# Patient Record
Sex: Female | Born: 1950 | Race: White | Hispanic: No | Marital: Married | Smoking: Former smoker
Health system: Southern US, Community
[De-identification: ages and names within clinical notes are randomized; demographics above are authoritative.]

## PROBLEM LIST (undated history)

## (undated) DIAGNOSIS — J45909 Unspecified asthma, uncomplicated: Secondary | ICD-10-CM

## (undated) DIAGNOSIS — T8859XA Other complications of anesthesia, initial encounter: Secondary | ICD-10-CM

## (undated) DIAGNOSIS — R7303 Prediabetes: Secondary | ICD-10-CM

## (undated) DIAGNOSIS — M1990 Unspecified osteoarthritis, unspecified site: Secondary | ICD-10-CM

## (undated) DIAGNOSIS — J329 Chronic sinusitis, unspecified: Secondary | ICD-10-CM

## (undated) DIAGNOSIS — J189 Pneumonia, unspecified organism: Secondary | ICD-10-CM

## (undated) DIAGNOSIS — K219 Gastro-esophageal reflux disease without esophagitis: Secondary | ICD-10-CM

## (undated) DIAGNOSIS — Z87442 Personal history of urinary calculi: Secondary | ICD-10-CM

## (undated) DIAGNOSIS — I499 Cardiac arrhythmia, unspecified: Secondary | ICD-10-CM

## (undated) DIAGNOSIS — T4145XA Adverse effect of unspecified anesthetic, initial encounter: Secondary | ICD-10-CM

## (undated) DIAGNOSIS — K4130 Unilateral femoral hernia, with obstruction, without gangrene, not specified as recurrent: Secondary | ICD-10-CM

## (undated) DIAGNOSIS — I959 Hypotension, unspecified: Secondary | ICD-10-CM

## (undated) DIAGNOSIS — H6690 Otitis media, unspecified, unspecified ear: Secondary | ICD-10-CM

## (undated) DIAGNOSIS — J3089 Other allergic rhinitis: Secondary | ICD-10-CM

## (undated) DIAGNOSIS — I4510 Unspecified right bundle-branch block: Secondary | ICD-10-CM

## (undated) DIAGNOSIS — C801 Malignant (primary) neoplasm, unspecified: Secondary | ICD-10-CM

## (undated) HISTORY — PX: COLONOSCOPY: SHX174

## (undated) HISTORY — PX: COLON SURGERY: SHX602

## (undated) HISTORY — PX: CATARACT EXTRACTION: SUR2

## (undated) HISTORY — PX: REFRACTIVE SURGERY: SHX103

## (undated) HISTORY — PX: KNEE SURGERY: SHX244

## (undated) HISTORY — PX: FOOT SURGERY: SHX648

## (undated) HISTORY — PX: COLOSTOMY: SHX63

---

## 1997-12-30 ENCOUNTER — Other Ambulatory Visit: Admission: RE | Admit: 1997-12-30 | Discharge: 1997-12-30 | Payer: Self-pay | Admitting: Gynecology

## 1998-09-13 ENCOUNTER — Other Ambulatory Visit: Admission: RE | Admit: 1998-09-13 | Discharge: 1998-09-13 | Payer: Self-pay | Admitting: Gynecology

## 1998-11-08 ENCOUNTER — Ambulatory Visit (HOSPITAL_BASED_OUTPATIENT_CLINIC_OR_DEPARTMENT_OTHER): Admission: RE | Admit: 1998-11-08 | Discharge: 1998-11-08 | Payer: Self-pay | Admitting: Orthopedic Surgery

## 1999-05-09 ENCOUNTER — Ambulatory Visit (HOSPITAL_BASED_OUTPATIENT_CLINIC_OR_DEPARTMENT_OTHER): Admission: RE | Admit: 1999-05-09 | Discharge: 1999-05-09 | Payer: Self-pay | Admitting: Orthopedic Surgery

## 1999-12-22 ENCOUNTER — Other Ambulatory Visit: Admission: RE | Admit: 1999-12-22 | Discharge: 1999-12-22 | Payer: Self-pay | Admitting: Gynecology

## 2000-06-28 ENCOUNTER — Other Ambulatory Visit: Admission: RE | Admit: 2000-06-28 | Discharge: 2000-06-28 | Payer: Self-pay | Admitting: Gynecology

## 2001-01-09 ENCOUNTER — Ambulatory Visit (HOSPITAL_BASED_OUTPATIENT_CLINIC_OR_DEPARTMENT_OTHER): Admission: RE | Admit: 2001-01-09 | Discharge: 2001-01-09 | Payer: Self-pay | Admitting: Orthopedic Surgery

## 2001-02-21 ENCOUNTER — Other Ambulatory Visit: Admission: RE | Admit: 2001-02-21 | Discharge: 2001-02-21 | Payer: Self-pay | Admitting: Gynecology

## 2002-05-12 ENCOUNTER — Other Ambulatory Visit: Admission: RE | Admit: 2002-05-12 | Discharge: 2002-05-12 | Payer: Self-pay | Admitting: Gynecology

## 2002-11-11 ENCOUNTER — Other Ambulatory Visit: Admission: RE | Admit: 2002-11-11 | Discharge: 2002-11-11 | Payer: Self-pay | Admitting: Gynecology

## 2003-06-18 ENCOUNTER — Other Ambulatory Visit: Admission: RE | Admit: 2003-06-18 | Discharge: 2003-06-18 | Payer: Self-pay | Admitting: Gynecology

## 2004-07-28 ENCOUNTER — Other Ambulatory Visit: Admission: RE | Admit: 2004-07-28 | Discharge: 2004-07-28 | Payer: Self-pay | Admitting: Gynecology

## 2010-09-30 ENCOUNTER — Other Ambulatory Visit: Payer: Self-pay | Admitting: Orthopedic Surgery

## 2010-09-30 ENCOUNTER — Encounter (INDEPENDENT_AMBULATORY_CARE_PROVIDER_SITE_OTHER): Payer: 59 | Admitting: *Deleted

## 2010-09-30 DIAGNOSIS — M79609 Pain in unspecified limb: Secondary | ICD-10-CM

## 2010-09-30 DIAGNOSIS — M7989 Other specified soft tissue disorders: Secondary | ICD-10-CM

## 2010-10-04 ENCOUNTER — Encounter: Payer: Self-pay | Admitting: Orthopedic Surgery

## 2010-11-04 ENCOUNTER — Encounter (HOSPITAL_BASED_OUTPATIENT_CLINIC_OR_DEPARTMENT_OTHER)
Admission: RE | Admit: 2010-11-04 | Discharge: 2010-11-04 | Disposition: A | Discharge status: Home / Self Care | Payer: 59 | Source: Ambulatory Visit | Attending: Orthopedic Surgery | Admitting: Orthopedic Surgery

## 2010-11-07 ENCOUNTER — Ambulatory Visit (HOSPITAL_BASED_OUTPATIENT_CLINIC_OR_DEPARTMENT_OTHER)
Admission: RE | Admit: 2010-11-07 | Discharge: 2010-11-07 | Disposition: A | Discharge status: Home / Self Care | Payer: 59 | Source: Ambulatory Visit | Attending: Orthopedic Surgery | Admitting: Orthopedic Surgery

## 2010-11-07 DIAGNOSIS — IMO0002 Reserved for concepts with insufficient information to code with codable children: Secondary | ICD-10-CM | POA: Insufficient documentation

## 2010-11-07 DIAGNOSIS — F17200 Nicotine dependence, unspecified, uncomplicated: Secondary | ICD-10-CM | POA: Insufficient documentation

## 2010-11-07 DIAGNOSIS — Z01810 Encounter for preprocedural cardiovascular examination: Secondary | ICD-10-CM | POA: Insufficient documentation

## 2010-11-07 DIAGNOSIS — Z01812 Encounter for preprocedural laboratory examination: Secondary | ICD-10-CM | POA: Insufficient documentation

## 2010-11-07 DIAGNOSIS — M2240 Chondromalacia patellae, unspecified knee: Secondary | ICD-10-CM | POA: Insufficient documentation

## 2010-11-07 DIAGNOSIS — J449 Chronic obstructive pulmonary disease, unspecified: Secondary | ICD-10-CM | POA: Insufficient documentation

## 2010-11-07 DIAGNOSIS — S83289A Other tear of lateral meniscus, current injury, unspecified knee, initial encounter: Secondary | ICD-10-CM | POA: Insufficient documentation

## 2010-11-07 DIAGNOSIS — J4489 Other specified chronic obstructive pulmonary disease: Secondary | ICD-10-CM | POA: Insufficient documentation

## 2010-11-07 LAB — POCT HEMOGLOBIN-HEMACUE: Hemoglobin: 14.3 g/dL (ref 12.0–15.0)
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
## 2013-07-11 ENCOUNTER — Telehealth: Payer: Self-pay

## 2013-07-11 NOTE — Telephone Encounter (Signed)
Rec'd from [facility] forward 2 pages to Historical Provider for Pulmonary

## 2014-02-13 HISTORY — PX: KNEE SURGERY: SHX244

## 2014-07-04 ENCOUNTER — Observation Stay (HOSPITAL_COMMUNITY): Payer: 59

## 2014-07-04 ENCOUNTER — Encounter (HOSPITAL_COMMUNITY)
Admission: EM | Disposition: A | Discharge status: Home / Self Care | Payer: Self-pay | Source: Home / Self Care | Attending: Emergency Medicine

## 2014-07-04 ENCOUNTER — Emergency Department (HOSPITAL_COMMUNITY): Payer: 59

## 2014-07-04 ENCOUNTER — Emergency Department (HOSPITAL_COMMUNITY): Payer: 59 | Admitting: Anesthesiology

## 2014-07-04 ENCOUNTER — Encounter (HOSPITAL_COMMUNITY): Payer: Self-pay | Admitting: *Deleted

## 2014-07-04 ENCOUNTER — Observation Stay (HOSPITAL_COMMUNITY)
Admission: EM | Admit: 2014-07-04 | Discharge: 2014-07-06 | Disposition: A | Discharge status: Home / Self Care | Payer: 59 | Attending: Orthopedic Surgery | Admitting: Orthopedic Surgery

## 2014-07-04 DIAGNOSIS — S52272A Monteggia's fracture of left ulna, initial encounter for closed fracture: Secondary | ICD-10-CM | POA: Diagnosis not present

## 2014-07-04 DIAGNOSIS — S42402A Unspecified fracture of lower end of left humerus, initial encounter for closed fracture: Secondary | ICD-10-CM

## 2014-07-04 DIAGNOSIS — S42409A Unspecified fracture of lower end of unspecified humerus, initial encounter for closed fracture: Secondary | ICD-10-CM | POA: Diagnosis present

## 2014-07-04 DIAGNOSIS — S52122A Displaced fracture of head of left radius, initial encounter for closed fracture: Secondary | ICD-10-CM

## 2014-07-04 DIAGNOSIS — F1099 Alcohol use, unspecified with unspecified alcohol-induced disorder: Secondary | ICD-10-CM | POA: Diagnosis not present

## 2014-07-04 DIAGNOSIS — F17210 Nicotine dependence, cigarettes, uncomplicated: Secondary | ICD-10-CM | POA: Insufficient documentation

## 2014-07-04 DIAGNOSIS — R52 Pain, unspecified: Secondary | ICD-10-CM

## 2014-07-04 DIAGNOSIS — W010XXA Fall on same level from slipping, tripping and stumbling without subsequent striking against object, initial encounter: Secondary | ICD-10-CM | POA: Insufficient documentation

## 2014-07-04 DIAGNOSIS — S5292XA Unspecified fracture of left forearm, initial encounter for closed fracture: Secondary | ICD-10-CM

## 2014-07-04 DIAGNOSIS — Z885 Allergy status to narcotic agent status: Secondary | ICD-10-CM | POA: Insufficient documentation

## 2014-07-04 HISTORY — PX: ORIF ELBOW FRACTURE: SHX5031

## 2014-07-04 SURGERY — OPEN REDUCTION INTERNAL FIXATION (ORIF) ELBOW/OLECRANON FRACTURE
Anesthesia: Regional | Laterality: Left

## 2014-07-04 MED ORDER — ROPIVACAINE HCL 5 MG/ML IJ SOLN
INTRAMUSCULAR | Status: AC
  Filled 2014-07-04: qty 30

## 2014-07-04 MED ORDER — FENTANYL CITRATE (PF) 100 MCG/2ML IJ SOLN
INTRAMUSCULAR | Status: DC | PRN
  Administered 2014-07-04 (×8): 25 ug via INTRAVENOUS

## 2014-07-04 MED ORDER — DEXMEDETOMIDINE HCL IN NACL 200 MCG/50ML IV SOLN
0.4000 ug/kg/h | INTRAVENOUS | Status: DC
  Filled 2014-07-04: qty 50

## 2014-07-04 MED ORDER — LACTATED RINGERS IV SOLN
INTRAVENOUS | Status: DC | PRN
  Administered 2014-07-04: 19:00:00 via INTRAVENOUS

## 2014-07-04 MED ORDER — FENTANYL CITRATE (PF) 100 MCG/2ML IJ SOLN
INTRAMUSCULAR | Status: AC
  Filled 2014-07-04: qty 2

## 2014-07-04 MED ORDER — ONDANSETRON HCL 4 MG/2ML IJ SOLN
INTRAMUSCULAR | Status: DC | PRN
  Administered 2014-07-04: 4 mg via INTRAVENOUS

## 2014-07-04 MED ORDER — CEFAZOLIN SODIUM-DEXTROSE 2-3 GM-% IV SOLR
INTRAVENOUS | Status: AC
  Filled 2014-07-04: qty 50

## 2014-07-04 MED ORDER — HYDROMORPHONE HCL 1 MG/ML IJ SOLN
1.0000 mg | Freq: Once | INTRAMUSCULAR | Status: AC
  Administered 2014-07-04: 1 mg via INTRAMUSCULAR
  Filled 2014-07-04: qty 1

## 2014-07-04 MED ORDER — ONDANSETRON HCL 4 MG/2ML IJ SOLN
4.0000 mg | Freq: Three times a day (TID) | INTRAMUSCULAR | Status: DC | PRN
  Administered 2014-07-04: 4 mg via INTRAVENOUS
  Filled 2014-07-04: qty 2

## 2014-07-04 MED ORDER — ONDANSETRON 8 MG PO TBDP
8.0000 mg | ORAL_TABLET | Freq: Once | ORAL | Status: AC
  Administered 2014-07-04: 8 mg via ORAL
  Filled 2014-07-04: qty 1

## 2014-07-04 MED ORDER — 0.9 % SODIUM CHLORIDE (POUR BTL) OPTIME
TOPICAL | Status: DC | PRN
  Administered 2014-07-04 (×3): 1000 mL

## 2014-07-04 MED ORDER — CEFAZOLIN SODIUM-DEXTROSE 2-3 GM-% IV SOLR
INTRAVENOUS | Status: DC | PRN
  Administered 2014-07-04: 2 g via INTRAVENOUS

## 2014-07-04 MED ORDER — HYDROMORPHONE HCL 1 MG/ML IJ SOLN: 0.2500 mg | INTRAMUSCULAR | Status: DC | PRN

## 2014-07-04 MED ORDER — ONDANSETRON HCL 4 MG/2ML IJ SOLN
INTRAMUSCULAR | Status: AC
  Filled 2014-07-04: qty 2

## 2014-07-04 MED ORDER — MIDAZOLAM HCL 2 MG/2ML IJ SOLN
INTRAMUSCULAR | Status: AC
  Filled 2014-07-04: qty 2

## 2014-07-04 MED ORDER — ROPIVACAINE HCL 5 MG/ML IJ SOLN
INTRAMUSCULAR | Status: DC | PRN
  Administered 2014-07-04: 30 mL via PERINEURAL

## 2014-07-04 MED ORDER — MIDAZOLAM HCL 5 MG/5ML IJ SOLN
INTRAMUSCULAR | Status: DC | PRN
  Administered 2014-07-04 (×2): 1 mg via INTRAVENOUS

## 2014-07-04 SURGICAL SUPPLY — 63 items
BANDAGE ELASTIC 4 VELCRO ST LF (GAUZE/BANDAGES/DRESSINGS) ×2 IMPLANT
BIT DRILL 2.5X2.75 QC CALB (BIT) ×2 IMPLANT
BIT DRILL CALIBRATED 2.7 (BIT) ×2 IMPLANT
BIT DRILL CANN 1.6 BIODRIVE (BIT) ×2 IMPLANT
BNDG CMPR 9X4 STRL LF SNTH (GAUZE/BANDAGES/DRESSINGS) ×1
BNDG COHESIVE 4X5 TAN STRL (GAUZE/BANDAGES/DRESSINGS) ×2 IMPLANT
BNDG ESMARK 4X9 LF (GAUZE/BANDAGES/DRESSINGS) ×2 IMPLANT
BNDG GAUZE ELAST 4 BULKY (GAUZE/BANDAGES/DRESSINGS) ×2 IMPLANT
COVER MAYO STAND STRL (DRAPES) ×4 IMPLANT
COVER SURGICAL LIGHT HANDLE (MISCELLANEOUS) ×2 IMPLANT
CUFF TOURN SGL QUICK 18 (TOURNIQUET CUFF) ×2 IMPLANT
CUFF TOURN SGL QUICK 24 (TOURNIQUET CUFF) ×2
CUFF TRNQT CYL 24X4X40X1 (TOURNIQUET CUFF) ×1 IMPLANT
DRAPE C-ARM 42X120 X-RAY (DRAPES) ×2 IMPLANT
DRAPE INCISE IOBAN 66X45 STRL (DRAPES) ×2 IMPLANT
DRAPE U-SHAPE 47X51 STRL (DRAPES) ×4 IMPLANT
DRSG AQUACEL AG ADV 3.5X10 (GAUZE/BANDAGES/DRESSINGS) ×2 IMPLANT
DURAPREP 26ML APPLICATOR (WOUND CARE) ×2 IMPLANT
ELECT REM PT RETURN 9FT ADLT (ELECTROSURGICAL) ×2
ELECTRODE REM PT RTRN 9FT ADLT (ELECTROSURGICAL) ×1 IMPLANT
GAUZE SPONGE 4X4 12PLY STRL (GAUZE/BANDAGES/DRESSINGS) ×2 IMPLANT
GLOVE BIOGEL PI IND STRL 8 (GLOVE) ×2 IMPLANT
GLOVE BIOGEL PI INDICATOR 8 (GLOVE) ×2
GLOVE SURG ORTHO 8.0 STRL STRW (GLOVE) ×4 IMPLANT
GOWN STRL REUS W/TWL LRG LVL3 (GOWN DISPOSABLE) ×8 IMPLANT
K-WIRE .8 (WIRE) ×4 IMPLANT
K-WIRE FIXATION 2.0X6 (WIRE)
KIT BASIN OR (CUSTOM PROCEDURE TRAY) ×2 IMPLANT
KWIRE FIXATION 2.0X6 (WIRE) IMPLANT
MANIFOLD NEPTUNE II (INSTRUMENTS) ×2 IMPLANT
NEEDLE HYPO 22GX1.5 SAFETY (NEEDLE) IMPLANT
PACK ORTHO EXTREMITY (CUSTOM PROCEDURE TRAY) ×2 IMPLANT
PAD ABD 8X10 STRL (GAUZE/BANDAGES/DRESSINGS) ×2 IMPLANT
PAD CAST 4YDX4 CTTN HI CHSV (CAST SUPPLIES) ×1 IMPLANT
PADDING CAST ABS 4INX4YD NS (CAST SUPPLIES) ×1
PADDING CAST ABS COTTON 4X4 ST (CAST SUPPLIES) ×1 IMPLANT
PADDING CAST COTTON 4X4 STRL (CAST SUPPLIES) ×2
PENCIL BUTTON HOLSTER BLD 10FT (ELECTRODE) ×2 IMPLANT
PLATE OLECRANON LRG (Plate) ×2 IMPLANT
POSITIONER SURGICAL ARM (MISCELLANEOUS) ×2 IMPLANT
SCREW BIODRIVE MICRO 2.3X14 (Screw) ×2 IMPLANT
SCREW BIODRIVE MICRO 2.3X18 (Screw) ×4 IMPLANT
SCREW LOCK CORT STAR 3.5X14 (Screw) ×6 IMPLANT
SCREW LOCK CORT STAR 3.5X58 (Screw) ×2 IMPLANT
SCREW LOW PROFILE 18MMX3.5MM (Screw) ×4 IMPLANT
SCREW LOW PROFILE 22MMX3.5MM (Screw) ×2 IMPLANT
SCREW NON LOCKING LP 3.5 16MM (Screw) ×2 IMPLANT
SPLINT PLASTER CAST XFAST 5X30 (CAST SUPPLIES) ×1 IMPLANT
SPLINT PLASTER XFAST SET 5X30 (CAST SUPPLIES) ×1
SPONGE LAP 4X18 X RAY DECT (DISPOSABLE) ×4 IMPLANT
STAPLER VISISTAT 35W (STAPLE) IMPLANT
STOCKINETTE 8 INCH (MISCELLANEOUS) IMPLANT
STRIP CLOSURE SKIN 1/2X4 (GAUZE/BANDAGES/DRESSINGS) ×2 IMPLANT
SUCTION FRAZIER 12FR DISP (SUCTIONS) ×2 IMPLANT
SUCTION FRAZIER TIP 10 FR DISP (SUCTIONS) ×2 IMPLANT
SUT MNCRL AB 3-0 PS2 18 (SUTURE) ×2 IMPLANT
SUT VIC AB 0 CT2 27 (SUTURE) ×2 IMPLANT
SUT VIC AB 2-0 CT1 27 (SUTURE) ×4
SUT VIC AB 2-0 CT1 TAPERPNT 27 (SUTURE) ×2 IMPLANT
SUT VIC AB 2-0 CT2 27 (SUTURE) ×6 IMPLANT
SYR CONTROL 10ML LL (SYRINGE) IMPLANT
TOWEL OR 17X26 10 PK STRL BLUE (TOWEL DISPOSABLE) ×6 IMPLANT
WASHER 3.5MM (Orthopedic Implant) ×2 IMPLANT

## 2014-07-04 NOTE — Anesthesia Preprocedure Evaluation (Addendum)
Anesthesia Evaluation  Patient identified by MRN, date of birth, ID band Patient awake    Reviewed: Allergy & Precautions, NPO status , Patient's Chart, lab work & pertinent test results  Airway Mallampati: II  TM Distance: >3 FB Neck ROM: Full    Dental no notable dental hx.    Pulmonary Current Smoker,  breath sounds clear to auscultation  Pulmonary exam normal       Cardiovascular negative cardio ROS Normal cardiovascular examRhythm:Regular Rate:Normal     Neuro/Psych negative neurological ROS  negative psych ROS   GI/Hepatic negative GI ROS, Neg liver ROS,   Endo/Other  negative endocrine ROS  Renal/GU negative Renal ROS  negative genitourinary   Musculoskeletal negative musculoskeletal ROS (+)   Abdominal   Peds negative pediatric ROS (+)  Hematology negative hematology ROS (+)   Anesthesia Other Findings   Reproductive/Obstetrics negative OB ROS                          Anesthesia Physical Anesthesia Plan  ASA: II and emergent  Anesthesia Plan: Regional   Post-op Pain Management:    Induction: Intravenous  Airway Management Planned: Natural Airway and Nasal Cannula  Additional Equipment:   Intra-op Plan:   Post-operative Plan:   Informed Consent: I have reviewed the patients History and Physical, chart, labs and discussed the procedure including the risks, benefits and alternatives for the proposed anesthesia with the patient or authorized representative who has indicated his/her understanding and acceptance.   Dental advisory given  Plan Discussed with: CRNA  Anesthesia Plan Comments: (Discussed risks and benefits of supraclavicular block including failure, bleeding, infection, nerve damage, weakness, pneumothorax.. Questions answered. Patient consents to block.  Ms. Shevchenko states she has had problems with general anesthesia and propofol in the past. She states  she is very sensitive to medications and she requests light sedation for this procedure.)      Anesthesia Quick Evaluation

## 2014-07-04 NOTE — Transfer of Care (Signed)
Immediate Anesthesia Transfer of Care Note  Patient: Amber Klein  Procedure(s) Performed: Procedure(s): OPEN REDUCTION INTERNAL FIXATION (ORIF) ELBOW/OLECRANON FRACTURE (Left)  Patient Location: PACU  Anesthesia Type:MAC and Regional  Level of Consciousness: awake, alert , oriented and patient cooperative  Airway & Oxygen Therapy: Patient Spontanous Breathing and Patient connected to nasal cannula oxygen  Post-op Assessment: Report given to RN, Post -op Vital signs reviewed and stable and Patient moving all extremities  Post vital signs: Reviewed and stable  Last Vitals:  Filed Vitals:   07/04/14 1753  BP: 115/77  Pulse: 80  Temp:   Resp: 16    Complications: No apparent anesthesia complications

## 2014-07-04 NOTE — ED Notes (Signed)
MD at bedside. 

## 2014-07-04 NOTE — Anesthesia Procedure Notes (Addendum)
Procedure Name: MAC Date/Time: 07/04/2014 7:51 PM Performed by: Carleene Cooper A Pre-anesthesia Checklist: Patient identified, Timeout performed, Emergency Drugs available, Suction available and Patient being monitored Patient Re-evaluated:Patient Re-evaluated prior to inductionOxygen Delivery Method: Nasal cannula Dental Injury: Teeth and Oropharynx as per pre-operative assessment    Anesthesia Regional Block:  Supraclavicular block  Pre-Anesthetic Checklist: ,, timeout performed, Correct Patient, Correct Site, Correct Laterality, Correct Procedure, Correct Position, site marked, Risks and benefits discussed, Surgical consent,  Pre-op evaluation,  At surgeon's request  Laterality: Left and Upper  Prep: chloraprep       Needles:  Injection technique: Single-shot      Needle Gauge: 21 and 21 G    Additional Needles:  Procedures: ultrasound guided (picture in chart) Supraclavicular block Narrative:  Start time: 07/04/2014 7:30 PM End time: 07/04/2014 7:45 PM Injection made incrementally with aspirations every 5 mL.  Performed by: Personally  Anesthesiologist: Franne Grip  Additional Notes: No pain with injection. No increased resistance to injection. Meaningful verbal contact maintained with patient throughout placement of block. Tolerated well. Ultrasound picture in chart. Loss of pain in left elbow soon after block.

## 2014-07-04 NOTE — ED Notes (Signed)
Pt reports fall, tripping over bottom of a step. Fell onto L side, C/o L arm pain, specifically elbow but radiating to upper arm and down to wrist. No shoulder pain, CMS intact. Swelling to L hand present. Does have a ring on 4th digit that she is unable off easily at this point. Denies hitting head.

## 2014-07-04 NOTE — Brief Op Note (Signed)
07/04/2014  11:23 PM  PATIENT:  Amber Klein  64 y.o. female  PRE-OPERATIVE DIAGNOSIS:  left elbow fracture  POST-OPERATIVE DIAGNOSIS:  left elbow fracture  PROCEDURE:  Procedure(s): OPEN REDUCTION INTERNAL FIXATION (ORIF) ELBOW/OLECRANON FRACTURE ulna and radial head  SURGEON:  Surgeon(s): Meredith Pel, MD  ASSISTANT: none  ANESTHESIA:   regional  EBL: 50 ml    Total I/O In: 1000 [I.V.:1000] Out: 25 [Blood:25]  BLOOD ADMINISTERED: none  DRAINS: none   LOCAL MEDICATIONS USED:  none  SPECIMEN:  No Specimen  COUNTS:  YES  TOURNIQUET:   Total Tourniquet Time Documented: Upper Arm (Left) - 107 minutes Total: Upper Arm (Left) - 107 minutes   DICTATION: .Other Dictation: Dictation Number [id number]  PLAN OF CARE: Admit for overnight observation  PATIENT DISPOSITION:  PACU - hemodynamically stable

## 2014-07-04 NOTE — ED Notes (Signed)
Patient transported to X-ray 

## 2014-07-04 NOTE — H&P (Signed)
Amber Klein is an 64 y.o. female.   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 She denies any loss of consciousness but does report significant left elbow pain. Denies any other orthopedic complaints. She does have a history of having had reactions to surgical anesthesia. Last hand surgery in the surgery were done under block. She describes a particularly severe reaction to propofol  History reviewed. No pertinent past medical history.  Past Surgical History  Procedure Laterality Date  . Knee surgery    . Foot surgery    . Cesarean section      No family history on file. Social History:  reports that she has been smoking.  She does not have any smokeless tobacco history on file. She reports that she drinks alcohol. She reports that she does not use illicit drugs.  Allergies:  Allergies  Allergen Reactions  . Propoxyphene Anaphylaxis     (Not in a hospital admission)  No results found for this or any previous visit (from the past 48 hour(s)). Dg Elbow Complete Left  07/04/2014   CLINICAL DATA:  Patient fell, tripping while on stairs, falling onto the left arm. Initial encounter.  EXAM: LEFT ELBOW - COMPLETE 3+ VIEW  COMPARISON:  Left humerus x-rays obtained concurrently.  FINDINGS: Comminuted transverse and horizontal fracture involving the proximal ulnar metaphysis. Fracture involving the radial head, best seen on the lateral image. Joint effusion/hemarthrosis with posterior fat pad. Well preserved bone mineral density for age. Joint spaces well preserved.  IMPRESSION: 1. Comminuted fracture involving the proximal ulnar metaphysis. 2. Radial head fracture.   Electronically Signed   By: Evangeline Dakin M.D.   On: 07/04/2014 13:42   Dg Wrist Complete Left  07/04/2014   CLINICAL DATA:  Patient with generalized left breast pain status post fall. No prior left arm injuries.  EXAM: LEFT WRIST -  COMPLETE 3+ VIEW  COMPARISON:  None.  FINDINGS: Normal anatomic alignment. No definite evidence for acute fracture or dislocation. Degenerative changes at the first [facility] joint. Regional soft tissues unremarkable.  IMPRESSION: No evidence for acute fracture or dislocation. If the patient's tenderness is in the vicinity of the scaphoid/anatomic snuffbox, then cross-sectional imaging or presumptive treatment for occult scaphoid fracture might be considered.   Electronically Signed   By: Lovey Newcomer M.D.   On: 07/04/2014 13:47   Dg Humerus Left  07/04/2014   CLINICAL DATA:  Fall with acute left arm pain.  Initial encounter.  EXAM: LEFT HUMERUS - 2+ VIEW  COMPARISON:  Elbow films on the same date.  FINDINGS: No evidence of acute humeral fracture or dislocation. The proximal ulnar fracture is visible is better delineated on the elbow series.  IMPRESSION: No evidence of acute fracture involving the left humerus.   Electronically Signed   By: Aletta Edouard M.D.   On: 07/04/2014 13:51    Review of Systems  Constitutional: Negative.   HENT: Negative.   Eyes: Negative.   Respiratory: Negative.   Cardiovascular: Negative.   Gastrointestinal: Negative.   Genitourinary: Negative.   Musculoskeletal: Positive for joint pain.  Skin: Negative.   Neurological: Negative.   Endo/Heme/Allergies: Negative.   Psychiatric/Behavioral: Negative.     Blood pressure 114/73, pulse 83, temperature 98 F (36.7 C), temperature source Oral, resp. rate 16, SpO2 98 %. Physical Exam  Constitutional: She appears well-developed.  HENT:  Head: Normocephalic.  Eyes: Conjunctivae are normal.  Neck: Normal  range of motion.  Cardiovascular: Normal rate.   Respiratory: Effort normal.  Neurological: She is alert.  Skin: Skin is warm.  Psychiatric: She has a normal mood and affect.   examination the left arm demonstrates that the arm splinted shoulder and wrist have no ecchymosis or bruising she has palpable radial pulse intact  EPL FPL interosseous function  Assessment/Plan Impression is proximal ulna fracture comminuted with radial head fracture nondominant arm 64 year old female with a history of osteoporosis or osteopenia plan is for CT scan to better define the radial head fracture she will need fixation of this fracture done potentially under interscalene block anesthesia. I don't think this fracture has much intrinsic ability to heal on its own without significant elbow stiffness. The goals of surgery to restore stable fixation and alignment to allow for early motion are discussed. Other risks including but not been limited to infection nerve vessel damage elbow stiffness potential need for hardware removal discussed with the patient all questions answered  DEAN,GREGORY SCOTT 07/04/2014, 4:17 PM

## 2014-07-04 NOTE — ED Provider Notes (Signed)
CSN: [id number]     Arrival date & time 07/04/14  1224 History   First MD Initiated Contact with Patient 07/04/14 1247     Chief Complaint  Patient presents with  . Shoulder Pain  . Fall     (Consider location/radiation/quality/duration/timing/severity/associated sxs/prior Treatment) HPI   Amber Klein is a 64 y.o. female who presents for evaluation of left elbow injury, fall. She states that she tripped and fell, in her home. She complains of pain only in her left elbow. She denies headache, back pain, neck pain, nausea, vomiting, weakness or dizziness. There are no preceding symptoms, before the fall. She is able to ambulate. She came here by private vehicle.   History reviewed. No pertinent past medical history. Past Surgical History  Procedure Laterality Date  . Knee surgery    . Foot surgery    . Cesarean section     No family history on file. History  Substance Use Topics  . Smoking status: Current Every Day Smoker  . Smokeless tobacco: Not on file  . Alcohol Use: Yes     Comment: 1/2 beer daily   OB History    No data available     Review of Systems    Allergies  Propoxyphene  Home Medications   Prior to Admission medications   Not on File   BP 112/86 mmHg  Pulse 98  Temp(Src) 98 F (36.7 C) (Oral)  Resp 16  SpO2 95% Physical Exam  Constitutional: She is oriented to person, place, and time. She appears well-developed and well-nourished.  HENT:  Head: Normocephalic and atraumatic.  Right Ear: External ear normal.  Left Ear: External ear normal.  Eyes: Conjunctivae and EOM are normal. Pupils are equal, round, and reactive to light.  Neck: Normal range of motion and phonation normal. Neck supple.  Cardiovascular: Normal rate, regular rhythm and normal heart sounds.   Pulmonary/Chest: Effort normal and breath sounds normal. No respiratory distress. She exhibits no bony tenderness.  Abdominal: Soft. There is no tenderness.  Musculoskeletal:  Normal range of motion.  Nontender left shoulder. No deformity, left shoulder. Diffusely tender, left elbow without deformity. No vascular intact distally left hand. I removed a ring from her left ring finger without problem.  Neurological: She is alert and oriented to person, place, and time. No cranial nerve deficit or sensory deficit. She exhibits normal muscle tone. Coordination normal.  Skin: Skin is warm, dry and intact.  Psychiatric: She has a normal mood and affect. Her behavior is normal. Judgment and thought content normal.  Nursing note and vitals reviewed.   ED Course  Procedures (including critical care time)   Medications  HYDROmorphone (DILAUDID) injection 1 mg (not administered)  ondansetron (ZOFRAN-ODT) disintegrating tablet 8 mg (not administered)    Patient Vitals for the past 24 hrs:  BP Temp Temp src Pulse Resp SpO2  07/04/14 1230 112/86 mmHg 98 F (36.7 C) Oral 98 16 95 %    12:55 PM Reevaluation with update and discussion. After initial assessment and treatment, an updated evaluation reveals she is more comfortable now. Patient updated on findings.[name] L   14:25- case discussed with orthopedics. He requests CT imaging of the injury  15:50- Care to Dr. Sabra Heck to evaluate after return of CT and coordinate care with Dr. Marlou Sa (Ortho).  Labs Review Labs Reviewed - No data to display  Imaging Review No results found.   EKG Interpretation None      MDM   Final diagnoses:  Pain  Fracture, proximal radius or ulna, closed, left, initial encounter  Fracture of radial head, closed, left, initial encounter    Mechanical fall with isolated injury, left elbow. Orthopedic consultation with outpatient management and is expected. Disposition will be after CT imaging   Plan: Eval for D/C after CT.    [name], MD 07/07/14 [phone]

## 2014-07-04 NOTE — ED Provider Notes (Signed)
D/w Dr. Marlou Sa - recommends surgery - will take shortly - temp holding orders requested and written.  Noemi Chapel, MD 07/04/14 [phone]

## 2014-07-04 NOTE — ED Notes (Signed)
Pt in CT.

## 2014-07-05 ENCOUNTER — Encounter (HOSPITAL_COMMUNITY): Payer: Self-pay | Admitting: *Deleted

## 2014-07-05 MED ORDER — METOCLOPRAMIDE HCL 10 MG PO TABS: 5.0000 mg | ORAL_TABLET | Freq: Three times a day (TID) | ORAL | Status: DC | PRN

## 2014-07-05 MED ORDER — ASPIRIN 325 MG PO TABS
325.0000 mg | ORAL_TABLET | Freq: Every day | ORAL | Status: DC
Start: 2014-07-05 — End: 2014-07-06
  Administered 2014-07-05 – 2014-07-06 (×2): 325 mg via ORAL
  Filled 2014-07-05 (×2): qty 1

## 2014-07-05 MED ORDER — CEFAZOLIN SODIUM-DEXTROSE 2-3 GM-% IV SOLR
2.0000 g | Freq: Three times a day (TID) | INTRAVENOUS | Status: AC
  Administered 2014-07-05 (×2): 2 g via INTRAVENOUS
  Filled 2014-07-05 (×2): qty 50

## 2014-07-05 MED ORDER — METOCLOPRAMIDE HCL 5 MG/ML IJ SOLN: 5.0000 mg | Freq: Three times a day (TID) | INTRAMUSCULAR | Status: DC | PRN

## 2014-07-05 MED ORDER — POTASSIUM CHLORIDE IN NACL 20-0.9 MEQ/L-% IV SOLN
INTRAVENOUS | Status: AC
  Administered 2014-07-05: 75 mL/h via INTRAVENOUS
  Filled 2014-07-05 (×2): qty 1000

## 2014-07-05 MED ORDER — ACETAMINOPHEN 650 MG RE SUPP: 650.0000 mg | Freq: Four times a day (QID) | RECTAL | Status: DC | PRN

## 2014-07-05 MED ORDER — METHOCARBAMOL 500 MG PO TABS: 500.0000 mg | ORAL_TABLET | Freq: Four times a day (QID) | ORAL | Status: DC | PRN

## 2014-07-05 MED ORDER — HYDROCODONE-ACETAMINOPHEN 5-325 MG PO TABS: 1.0000 | ORAL_TABLET | ORAL | Status: DC | PRN

## 2014-07-05 MED ORDER — ONDANSETRON HCL 4 MG PO TABS
4.0000 mg | ORAL_TABLET | Freq: Four times a day (QID) | ORAL | Status: DC | PRN
  Administered 2014-07-06: 4 mg via ORAL
  Filled 2014-07-05: qty 1

## 2014-07-05 MED ORDER — ACETAMINOPHEN 325 MG PO TABS
650.0000 mg | ORAL_TABLET | Freq: Four times a day (QID) | ORAL | Status: DC | PRN
  Administered 2014-07-06: 325 mg via ORAL
  Filled 2014-07-05 (×2): qty 2

## 2014-07-05 MED ORDER — HYDROCODONE-ACETAMINOPHEN 5-325 MG PO TABS
1.0000 | ORAL_TABLET | ORAL | Status: DC | PRN
  Administered 2014-07-05: 2 via ORAL
  Administered 2014-07-05: 1 via ORAL
  Administered 2014-07-05: 2 via ORAL
  Administered 2014-07-05: 1 via ORAL
  Administered 2014-07-05 (×2): 2 via ORAL
  Administered 2014-07-06: 1 via ORAL
  Administered 2014-07-06: 2 via ORAL
  Filled 2014-07-05: qty 2
  Filled 2014-07-05 (×3): qty 1
  Filled 2014-07-05 (×4): qty 2

## 2014-07-05 MED ORDER — IBUPROFEN 200 MG PO TABS: 200.0000 mg | ORAL_TABLET | Freq: Four times a day (QID) | ORAL | Status: DC | PRN

## 2014-07-05 MED ORDER — ESTRADIOL 1 MG PO TABS
1.0000 mg | ORAL_TABLET | Freq: Every day | ORAL | Status: DC
  Administered 2014-07-05 – 2014-07-06 (×2): 1 mg via ORAL
  Filled 2014-07-05 (×2): qty 1

## 2014-07-05 MED ORDER — ONDANSETRON HCL 4 MG PO TABS: 4.0000 mg | ORAL_TABLET | Freq: Four times a day (QID) | ORAL | Status: DC | PRN

## 2014-07-05 MED ORDER — ADULT MULTIVITAMIN W/MINERALS CH
1.0000 | ORAL_TABLET | ORAL | Status: DC
  Administered 2014-07-05: 1 via ORAL
  Filled 2014-07-05: qty 1

## 2014-07-05 MED ORDER — ASPIRIN 325 MG PO TABS: 325.0000 mg | ORAL_TABLET | Freq: Every day | ORAL | Status: DC

## 2014-07-05 MED ORDER — PROGESTERONE MICRONIZED 100 MG PO CAPS
100.0000 mg | ORAL_CAPSULE | Freq: Every day | ORAL | Status: DC
Start: 2014-07-15 — End: 2014-07-06

## 2014-07-05 MED ORDER — ONDANSETRON HCL 4 MG/2ML IJ SOLN
4.0000 mg | Freq: Four times a day (QID) | INTRAMUSCULAR | Status: DC | PRN
  Administered 2014-07-05: 4 mg via INTRAVENOUS
  Filled 2014-07-05: qty 2

## 2014-07-05 MED ORDER — METHOCARBAMOL 500 MG PO TABS
500.0000 mg | ORAL_TABLET | Freq: Four times a day (QID) | ORAL | Status: DC | PRN
  Administered 2014-07-05 (×2): 500 mg via ORAL
  Filled 2014-07-05 (×2): qty 1

## 2014-07-05 MED ORDER — METHOCARBAMOL 1000 MG/10ML IJ SOLN
500.0000 mg | Freq: Four times a day (QID) | INTRAVENOUS | Status: DC | PRN
  Filled 2014-07-05: qty 5

## 2014-07-05 NOTE — Op Note (Signed)
NAME:  Amber Klein NO.:  [id number]  MEDICAL RECORD NO.:  [id number]  LOCATION:  [facility]                         FACILITY:  [facility]  PHYSICIAN:  Anderson Malta, M.D.    DATE OF BIRTH:  [birth year]  DATE OF PROCEDURE: DATE OF DISCHARGE:                              OPERATIVE REPORT   PREOPERATIVE DIAGNOSIS:  Left elbow Monteggia fracture and dislocation, proximal ulna with radial head fracture and dislocation.  POSTOPERATIVE DIAGNOSIS:  Left elbow Monteggia fracture and dislocation, proximal ulna with radial head fracture and dislocation.  PROCEDURE:  Reduction of dislocation with open reduction and internal fixation of ulnar fracture and radial head fracture.  SURGEON:  Anderson Malta, M.D.  ASSISTANT:  None.  ANESTHESIA:  General.  INDICATIONS:  Amber Klein is a 64 year old female with left elbow fracture and dislocation, presents for operative management after explanation of risks and benefits.  PROCEDURE IN DETAIL:  The patient was brought to the operating room where regional anesthetic was induced.  Preop IV antibiotics were administered.  Time-out was called.  The patient was placed on a beanbag in the semi-lateral position.  Left arm was prescribed with alcohol and Betadine, allowed to air dry, prepped with DuraPrep solution and draped in a sterile manner.  Charlie Pitter was used to cover the operative field.  Arm was elevated and exsanguinated with Esmarch wrap.  Tourniquet was inflated.  Total time 107 minutes.  Posterior incision was made just lateral to the olecranon.  Skin and subcutaneous tissue were sharply divided.  The fracture was identified.  Location of the ulnar nerve was palpated and that nerve was avoided at all times.  The periosteum over the fracture was mobilized.  Fracture was reduced, the radial head was reduced and this was checked under fluoroscopic guidance.  A Biomet olecranon plate was then placed with good fixation proximally  and distally.  At this time following plate fixation including the home-run screw, which was placed last, the arm was taken through range of motion and that was found to be stable.  At this time, the skin flap was elevated up around the lateral condyle.  Incision was made from the lateral epicondyle across the equator of the radial head through the [facility], [facility] interval.  Care was taken to avoid injury to the posterior interosseous nerve.  The fracture was identified.  Essentially, this was a shear fracture off the anterior one-third to two-fifth of the head. Plate fixation was not an option because of the safe zone concerns.  For that reason, headless screws were used.  Headless screws were placed with two screws were placed with good fixation achieved.  The arm was taken through range of motion, pronation, supination and was found to be intact.  One of the screws had to be switched out for shorter screw. This was checked under fluoroscopy fastidiously and found to have good length and no grinding with rotation.  The lateral collateral ligament was also intact.  At this time, thorough irrigation was performed. Tourniquet was released.  Bleeding points were controlled using electrocautery.  Annular ligament was loosely reapproximated using 2-0 Vicryl suture, split, and the common extensor tendon was repaired using 2-0 Vicryl suture.  The soft tissue was then closed over the plate using 2-0 interrupted Vicryl suture.  Thorough irrigation was then performed and the incision was closed using interrupted inverted 2-0 Vicryl suture and 3-0 Monocryl.  Well-padded posterior splint was applied.  The patient tolerated the procedure well without immediate complication. Transferred to the recovery room in stable condition for overnight observation.     Anderson Malta, M.D.     GSD/MEDQ  D:  07/04/2014  T:  07/05/2014  Job:  [id number]

## 2014-07-05 NOTE — Anesthesia Postprocedure Evaluation (Signed)
  Anesthesia Post-op Note  Patient: Amber Klein  Procedure(s) Performed: Procedure(s) (LRB): OPEN REDUCTION INTERNAL FIXATION (ORIF) ELBOW/OLECRANON FRACTURE (Left)  Patient Location: PACU  Anesthesia Type: MAC combined with regional for post-op pain  Level of Consciousness: awake and alert   Airway and Oxygen Therapy: Patient Spontanous Breathing  Post-op Pain: mild  Post-op Assessment: Post-op Vital signs reviewed, Patient's Cardiovascular Status Stable, Respiratory Function Stable, Patent Airway and No signs of Nausea or vomiting  Last Vitals:  Filed Vitals:   07/05/14 0010  BP: 95/64  Pulse: 85  Temp: 37.2 C  Resp: 14    Post-op Vital Signs: stable   Complications: No apparent anesthesia complications

## 2014-07-05 NOTE — Progress Notes (Signed)
Patient stable but having more pain out of the block is worn off Motor sensory function to the hand is intact on the left-hand side Patient having some nausea and thus we'll plan to keep her today for pain control and observation and DC in the morning

## 2014-07-06 ENCOUNTER — Encounter (HOSPITAL_COMMUNITY): Payer: Self-pay | Admitting: Orthopedic Surgery

## 2014-07-06 NOTE — Evaluation (Signed)
Occupational Therapy One Time Evaluation Patient Details Name: Amber Klein MRN: [id number] DOB: December 10, 1950 Today's Date: 07/06/2014    History of Present Illness Pt is s/p fall with Left elbow Monteggia fracture and dislocation,proximal ulna with radial head fracture and dislocation. Pt is s/p reduction of dislocation with open reduction and internal fixation of ulnar fracture and radial head fracture.   Clinical Impression   Pt currently limited by sinus pressure type pain and reports L elbow pain at 5/10 with dressing tasks. Educated on strategies to make ADL tasks easier and decrease pain and also practiced with don/doff sling and pt did well. No DME needs and pt has intermittent assist available. All questions answered and educated completed.     Follow Up Recommendations  No OT follow up;Supervision - Intermittent    Equipment Recommendations  None recommended by OT    Recommendations for Other Services       Precautions / Restrictions Precautions Precautions: Fall Required Braces or Orthoses: Sling Other Brace/Splint: L UE sling Restrictions Weight Bearing Restrictions: Yes      Mobility Bed Mobility Overal bed mobility: Modified Independent             General bed mobility comments: HOB rasied.  Transfers Overall transfer level: Needs assistance Equipment used: None Transfers: Sit to/from Stand Sit to Stand: Supervision         General transfer comment: cues to go slowly for safety.    Balance                                            ADL Overall ADL's : Needs assistance/impaired Eating/Feeding: Independent;Sitting   Grooming: Wash/dry hands;Standing;Supervision/safety   Upper Body Bathing: Set up;Sitting   Lower Body Bathing: Supervison/ safety;Sit to/from stand   Upper Body Dressing : Minimal assistance;Sitting   Lower Body Dressing: Sit to/from stand;Minimal assistance   Toilet Transfer:  Supervision/safety;Ambulation;Comfort height toilet   Toileting- Clothing Manipulation and Hygiene: Minimal assistance;Sit to/from stand         General ADL Comments: pt with tight fitting jeans that wont button currently so min assist to help pull up jeans on L side slightly. educated on wearing looser fitting pants with elastic waistband to make task easier. feel pt will be able to pull up elastic waist band pants that fit looser. Pt did well on and off comfort height commode and no DMe needed. Educated on sequence for UB dressing and practiced wtih donning/doffing sling. Pt needed slight min assist also to manage shirt as it fits tight (to help pull over head) and again educated on looser fitting shirt to make task easier. Discussed flosser sticks to make task easier one handed. Pt more limited by sinus pressure type headache. Educated on importance of moving digits frequently and making fist and release throughout the day. Educated on use of ice also and importance of elevation of L UE on pillows.      Vision     Perception     Praxis      Pertinent Vitals/Pain Pain Assessment: 0-10 Pain Score: 5  Pain Location: elbow, also reports sinus pressure. nursing made aware. Pain Descriptors / Indicators: Aching Pain Intervention(s): Monitored during session;Repositioned;Other (comment);Ice applied (informed nursing)     Hand Dominance Right   Extremity/Trunk Assessment Upper Extremity Assessment Upper Extremity Assessment: LUE deficits/detail LUE Deficits / Details: L elbow and wrist in  splint. pt able to move digits but note edema in digits. pt also able to raise L UE to help with dressing task.  LUE: Unable to fully assess due to immobilization           Communication Communication Communication: No difficulties   Cognition Arousal/Alertness: Awake/alert Behavior During Therapy: WFL for tasks assessed/performed Overall Cognitive Status: Within Functional Limits for tasks  assessed                     General Comments       Exercises       Shoulder Instructions      Home Living Family/patient expects to be discharged to:: Private residence Living Arrangements: Spouse/significant other Available Help at Discharge: Family Type of Home: House Home Access: Stairs to enter Technical brewer of Steps: 1   Home Layout: Able to live on main level with bedroom/bathroom;Two level     Bathroom Shower/Tub: Occupational psychologist: Standard     Home Equipment: None          Prior Functioning/Environment Level of Independence: Independent             OT Diagnosis: Generalized weakness   OT Problem List:     OT Treatment/Interventions:      OT Goals(Current goals can be found in the care plan section) Acute Rehab OT Goals Patient Stated Goal: decrease pain OT Goal Formulation: With patient  OT Frequency:     Barriers to D/C:            Co-evaluation              End of Session    Activity Tolerance: Patient tolerated treatment well Patient left: in chair;with call bell/phone within reach   Time: 0908-0940 OT Time Calculation (min): 32 min Charges:  OT General Charges $OT Visit: 1 Procedure OT Evaluation $Initial OT Evaluation Tier I: 1 Procedure OT Treatments $Self Care/Home Management : 8-22 mins G-Codes: OT G-codes **NOT FOR INPATIENT CLASS** Functional Assessment Tool Used: clinical judgement Functional Limitation: Self care Self Care Current Status (S9233): At least 1 percent but less than 20 percent impaired, limited or restricted Self Care Goal Status (A0762): At least 1 percent but less than 20 percent impaired, limited or restricted Self Care Discharge Status [phone]): At least 1 percent but less than 20 percent impaired, limited or restricted  Jules Schick  [phone] 07/06/2014, 10:31 AM

## 2014-07-06 NOTE — Progress Notes (Signed)
Pt's temp 101.1 at 0530.Enc IS and Tylenol given with 1 Norco.temp now 100.8. Pt states she thinks she is getting a sinus infection d/t hx of this with similar symptoms. Pt also believes she would benefit from an OT consult prior to d/c thank you Daylene Posey

## 2014-07-21 NOTE — Discharge Summary (Signed)
Physician Discharge Summary  Patient ID: Rickayla Wieland MRN: [id number] DOB/AGE: September 22, 1950 64 y.o.  Admit date: 07/04/2014 Discharge date: 07/06/2014  Admission Diagnoses:  Active Problems:   Elbow fracture   Discharge Diagnoses:  Same  Surgeries: Procedure(s): OPEN REDUCTION INTERNAL FIXATION (ORIF) ELBOW/OLECRANON FRACTURE on 07/04/2014 - 07/05/2014   Consultants:    Discharged Condition: Stable  Hospital Course: Daylene Vandenbosch is an 64 y.o. female who was admitted 07/04/2014 with a chief complaint of  Chief Complaint  Patient presents with  . Shoulder Pain  . Fall  , and found to have a diagnosis of elbow fracture dislocation.  They were brought to the operating room on 07/04/2014 - 07/05/2014 and underwent the above named procedures. Patient tolerated the procedure well. She was maintained in the hospital until 07/06/2014 at which time she was discharged. Pain medicines will working at the time of discharge. She will follow-up with me in 2-3 days to remove the splint and initiate range of motion exercises motor sensory function to the hand was intact at the time of discharge   Antibiotics given:  Anti-infectives    Start     Dose/Rate Route Frequency Ordered Stop   07/05/14 0400  ceFAZolin (ANCEF) IVPB 2 g/50 mL premix     2 g 100 mL/hr over 30 Minutes Intravenous Every 8 hours 07/05/14 0041 07/05/14 1159    .  Recent vital signs:  Filed Vitals:   07/06/14 1029  BP:   Pulse:   Temp: 99.3 F (37.4 C)  Resp:     Recent laboratory studies:  Results for orders placed or performed during the hospital encounter of 11/07/10  Hemoglobin-hemacue, POC  Result Value Ref Range   Hemoglobin 14.3 12.0 - 15.0 g/dL    Discharge Medications:     Medication List    TAKE these medications        aspirin 325 MG tablet  Take 1 tablet (325 mg total) by mouth daily.     estradiol 1 MG tablet  Commonly known as:  ESTRACE  Take 1 mg by mouth daily.     HYDROcodone-acetaminophen 5-325 MG per tablet  Commonly known as:  NORCO/VICODIN  Take 1-2 tablets by mouth every 4 (four) hours as needed (breakthrough pain).     ibuprofen 200 MG tablet  Commonly known as:  ADVIL  Take 1 tablet (200 mg total) by mouth every 6 (six) hours as needed.     methocarbamol 500 MG tablet  Commonly known as:  ROBAXIN  Take 1 tablet (500 mg total) by mouth every 6 (six) hours as needed for muscle spasms.     multivitamin with minerals Tabs tablet  Take 1 tablet by mouth every other day.     ondansetron 4 MG tablet  Commonly known as:  ZOFRAN  Take 1 tablet (4 mg total) by mouth every 6 (six) hours as needed for nausea.     progesterone 100 MG capsule  Commonly known as:  PROMETRIUM  Take 100 mg by mouth as directed.        Diagnostic Studies: Dg Elbow 2 Views Left  07/05/2014   CLINICAL DATA:  Left elbow ORIF  EXAM: LEFT ELBOW - 2 VIEW  COMPARISON:  07/04/2014  FINDINGS: Three intraoperative spot images demonstrate plate and screw fixation across the proximal ulnar fracture with anatomic alignment. Two screws are noted within the radial head, with anatomic alignment. No hardware complicating feature noted on these intraoperative spot views.  IMPRESSION: Internal fixation of the  proximal radial and ulnar fractures with anatomic alignment and no visible complicating feature.   Electronically Signed   By: Rolm Baptise M.D.   On: 07/05/2014 09:37   Dg Elbow Complete Left  07/04/2014   CLINICAL DATA:  Patient fell, tripping while on stairs, falling onto the left arm. Initial encounter.  EXAM: LEFT ELBOW - COMPLETE 3+ VIEW  COMPARISON:  Left humerus x-rays obtained concurrently.  FINDINGS: Comminuted transverse and horizontal fracture involving the proximal ulnar metaphysis. Fracture involving the radial head, best seen on the lateral image. Joint effusion/hemarthrosis with posterior fat pad. Well preserved bone mineral density for age. Joint spaces well preserved.   IMPRESSION: 1. Comminuted fracture involving the proximal ulnar metaphysis. 2. Radial head fracture.   Electronically Signed   By: Evangeline Dakin M.D.   On: 07/04/2014 13:42   Dg Wrist Complete Left  07/04/2014   CLINICAL DATA:  Patient with generalized left breast pain status post fall. No prior left arm injuries.  EXAM: LEFT WRIST - COMPLETE 3+ VIEW  COMPARISON:  None.  FINDINGS: Normal anatomic alignment. No definite evidence for acute fracture or dislocation. Degenerative changes at the first [facility] joint. Regional soft tissues unremarkable.  IMPRESSION: No evidence for acute fracture or dislocation. If the patient's tenderness is in the vicinity of the scaphoid/anatomic snuffbox, then cross-sectional imaging or presumptive treatment for occult scaphoid fracture might be considered.   Electronically Signed   By: Lovey Newcomer M.D.   On: 07/04/2014 13:47   Ct Elbow Left W/o Cm  07/04/2014   CLINICAL DATA:  Left elbow fracture dislocation.  EXAM: CT OF THE LEFT ELBOW WITHOUT CONTRAST  TECHNIQUE: Multidetector CT imaging was performed according to the standard protocol. Multiplanar CT image reconstructions were also generated.  COMPARISON:  07/04/2014  FINDINGS: Posterior dislocation of the radial head by 1.3 cm with respect to the capitellum. Fracture of the anterior lip of the radial head with smaller fragment measuring 0.8 by 0.6 by 1.3 cm as shown on image 14 of series 604.  Mildly comminuted fracture of the proximal ulnar metaphysis with an oblique component demonstrating 19 degrees of apex posterior angulation, and a somewhat longitudinal component extending from this oblique component proximally towards the coronoid process. The proximal extent of this longitudinal component is difficult to identify, but I doubt that this enters the joint. As best I can tell the fracture exits the anterior ulna just proximal to the coronoid process.  No distal humeral fracture is identified. As expected there is a large  elbow joint effusion.  IMPRESSION: 1. Bado type 2 Monteggia fracture-dislocation (dorsal dislocation of the radial head, dorsal angulation of the mildly comminuted ulnar fracture) with a fracture of the anterior lip of the radial head, and with a nondisplaced component of the ulnar fracture extending towards but not shown to be reaching reaching the articular surface of the coronoid process.   Electronically Signed   By: Van Clines M.D.   On: 07/04/2014 17:11   Dg Humerus Left  07/04/2014   CLINICAL DATA:  Fall with acute left arm pain.  Initial encounter.  EXAM: LEFT HUMERUS - 2+ VIEW  COMPARISON:  Elbow films on the same date.  FINDINGS: No evidence of acute humeral fracture or dislocation. The proximal ulnar fracture is visible is better delineated on the elbow series.  IMPRESSION: No evidence of acute fracture involving the left humerus.   Electronically Signed   By: Aletta Edouard M.D.   On: 07/04/2014 13:51   Dg  C-arm Gt 120 Min-no Report  07/04/2014   CLINICAL DATA: left elbow fracture   C-ARM GT 120 MINUTE  Fluoroscopy was utilized by the requesting physician.  No radiographic  interpretation.     Disposition: 01-Home or Self Care      Discharge Instructions    Call MD / Call 911    Complete by:  As directed   If you experience chest pain or shortness of breath, CALL 911 and be transported to the hospital emergency room.  If you develope a fever above 101 F, pus (white drainage) or increased drainage or redness at the wound, or calf pain, call your surgeon's office.     Constipation Prevention    Complete by:  As directed   Drink plenty of fluids.  Prune juice may be helpful.  You may use a stool softener, such as Colace (over the counter) 100 mg twice a day.  Use MiraLax (over the counter) for constipation as needed.     Diet - low sodium heart healthy    Complete by:  As directed      Discharge instructions    Complete by:  As directed   Elevate arm Move fingers Keep  splint dry Return to clinic on Wednesday for splint removal and initiation of range of motion exercises Take Motrin 3-4 times a day to decrease the chances of heterotopic ossification     Increase activity slowly as tolerated    Complete by:  As directed               Signed: DEAN,GREGORY SCOTT 07/21/2014, 5:01 PM

## 2014-08-25 ENCOUNTER — Other Ambulatory Visit (HOSPITAL_COMMUNITY): Payer: Self-pay | Admitting: Nurse Practitioner

## 2014-08-25 DIAGNOSIS — B182 Chronic viral hepatitis C: Secondary | ICD-10-CM

## 2014-12-11 LAB — GLUCOSE, POCT (MANUAL RESULT ENTRY): POC Glucose: 104 mg/dl — AB (ref 70–99)

## 2015-05-03 NOTE — Patient Instructions (Signed)
Your procedure is scheduled on:  Monday, May 10, 2015  Enter through the Main Entrance of [facility] at: 10:45 AM  Pick up the phone at the desk and dial [phone].  Call this number if you have problems the morning of surgery: [phone].  Remember:  Do NOT eat food: After Midnight Sunday  Do NOT drink clear liquids after:  8:00 AM day of surgery  Take these medicines the morning of surgery with a SIP OF WATER:  Omeprazole  Do NOT wear jewelry (body piercing), metal hair clips/bobby pins, make-up, or nail polish. Do NOT wear lotions, powders, or perfumes.  You may wear deodorant. Do NOT shave for 48 hours prior to surgery. Do NOT bring valuables to the hospital. Contacts, dentures, or bridgework may not be worn into surgery.  Have a responsible adult drive you home and stay with you for 24 hours after your procedure

## 2015-05-04 ENCOUNTER — Other Ambulatory Visit: Payer: Self-pay | Admitting: Obstetrics and Gynecology

## 2015-05-06 ENCOUNTER — Encounter (HOSPITAL_COMMUNITY)
Admission: RE | Admit: 2015-05-06 | Discharge: 2015-05-06 | Disposition: A | Discharge status: Home / Self Care | Payer: 59 | Source: Ambulatory Visit | Attending: Obstetrics and Gynecology | Admitting: Obstetrics and Gynecology

## 2015-05-06 ENCOUNTER — Other Ambulatory Visit: Payer: Self-pay

## 2015-05-06 ENCOUNTER — Encounter (HOSPITAL_COMMUNITY): Payer: Self-pay

## 2015-05-06 DIAGNOSIS — Z01812 Encounter for preprocedural laboratory examination: Secondary | ICD-10-CM | POA: Insufficient documentation

## 2015-05-06 DIAGNOSIS — Z01810 Encounter for preprocedural cardiovascular examination: Secondary | ICD-10-CM | POA: Insufficient documentation

## 2015-05-06 HISTORY — DX: Unspecified osteoarthritis, unspecified site: M19.90

## 2015-05-06 HISTORY — DX: Chronic sinusitis, unspecified: J32.9

## 2015-05-06 HISTORY — DX: Gastro-esophageal reflux disease without esophagitis: K21.9

## 2015-05-06 HISTORY — DX: Cardiac arrhythmia, unspecified: I49.9

## 2015-05-06 LAB — CBC
HCT: 41.8 % (ref 36.0–46.0)
Hemoglobin: 13.8 g/dL (ref 12.0–15.0)
MCH: 30.1 pg (ref 26.0–34.0)
MCHC: 33 g/dL (ref 30.0–36.0)
MCV: 91.3 fL (ref 78.0–100.0)
Platelets: 348 10*3/uL (ref 150–400)
RBC: 4.58 MIL/uL (ref 3.87–5.11)
RDW: 14.2 % (ref 11.5–15.5)
WBC: 8.6 10*3/uL (ref 4.0–10.5)

## 2015-05-06 NOTE — Anesthesia Preprocedure Evaluation (Addendum)
Anesthesia Evaluation  Patient identified by MRN, date of birth, ID band Patient awake    Reviewed: Allergy & Precautions, NPO status , Patient's Chart, lab work & pertinent test results  History of Anesthesia Complications (+) history of anesthetic complications  Airway Mallampati: II  TM Distance: >3 FB Neck ROM: Full    Dental no notable dental hx.    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 Worked up by [employer]. No followup since. Endorses good exercise tolerance and denies CP, DOE, orthopnea. No peripheral edema on exam.   Neuro/Psych negative neurological ROS  negative psych ROS   GI/Hepatic Neg liver ROS, GERD  Medicated,  Endo/Other  negative endocrine ROS  Renal/GU negative Renal ROS  negative genitourinary   Musculoskeletal  (+) Arthritis ,   Abdominal   Peds negative pediatric ROS (+)  Hematology negative hematology ROS (+)   Anesthesia Other Findings   Reproductive/Obstetrics negative OB ROS                            Anesthesia Physical Anesthesia Plan  ASA: II  Anesthesia Plan: Spinal   Post-op Pain Management:    Induction: Intravenous  Airway Management Planned:   Additional Equipment:   Intra-op Plan:   Post-operative Plan: Extubation in OR  Informed Consent: I have reviewed the patients History and Physical, chart, labs and discussed the procedure including the risks, benefits and alternatives for the proposed anesthesia with the patient or authorized representative who has indicated his/her understanding and acceptance.   Dental advisory given  Plan Discussed with: CRNA  Anesthesia Plan  Comments: (RBBB and PVCs. 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 Worked up by [employer]. No followup since. Endorses good exercise tolerance and denies CP, DOE, orthopnea. No peripheral edema on exam.  Discussed risks and benefits of and differences between spinal and general. Discussed risks of spinal including headache, backache, failure, bleeding and hematoma, infection, and nerve damage. Patient consents to spinal. Questions answered. Coagulation studies and platelet count acceptable.)      Anesthesia Quick Evaluation

## 2015-05-10 ENCOUNTER — Ambulatory Visit (HOSPITAL_COMMUNITY): Payer: 59 | Admitting: Anesthesiology

## 2015-05-10 ENCOUNTER — Encounter (HOSPITAL_COMMUNITY)
Admission: RE | Disposition: A | Discharge status: Home / Self Care | Payer: Self-pay | Source: Ambulatory Visit | Attending: Obstetrics and Gynecology

## 2015-05-10 ENCOUNTER — Encounter (HOSPITAL_COMMUNITY): Payer: Self-pay | Admitting: Emergency Medicine

## 2015-05-10 ENCOUNTER — Ambulatory Visit (HOSPITAL_COMMUNITY): Payer: 59 | Admitting: Registered Nurse

## 2015-05-10 ENCOUNTER — Ambulatory Visit (HOSPITAL_COMMUNITY)
Admission: RE | Admit: 2015-05-10 | Discharge: 2015-05-10 | Disposition: A | Discharge status: Home / Self Care | Payer: 59 | Source: Ambulatory Visit | Attending: Obstetrics and Gynecology | Admitting: Obstetrics and Gynecology

## 2015-05-10 DIAGNOSIS — Z882 Allergy status to sulfonamides status: Secondary | ICD-10-CM | POA: Diagnosis not present

## 2015-05-10 DIAGNOSIS — N903 Dysplasia of vulva, unspecified: Secondary | ICD-10-CM | POA: Insufficient documentation

## 2015-05-10 DIAGNOSIS — M1990 Unspecified osteoarthritis, unspecified site: Secondary | ICD-10-CM | POA: Insufficient documentation

## 2015-05-10 DIAGNOSIS — F17210 Nicotine dependence, cigarettes, uncomplicated: Secondary | ICD-10-CM | POA: Insufficient documentation

## 2015-05-10 DIAGNOSIS — K219 Gastro-esophageal reflux disease without esophagitis: Secondary | ICD-10-CM | POA: Insufficient documentation

## 2015-05-10 HISTORY — PX: CO2 LASER APPLICATION: SHX5778

## 2015-05-10 HISTORY — DX: Other complications of anesthesia, initial encounter: T88.59XA

## 2015-05-10 HISTORY — DX: Adverse effect of unspecified anesthetic, initial encounter: T41.45XA

## 2015-05-10 SURGERY — CO2 LASER APPLICATION
Anesthesia: Spinal

## 2015-05-10 MED ORDER — MIDAZOLAM HCL 5 MG/5ML IJ SOLN
INTRAMUSCULAR | Status: DC | PRN
  Administered 2015-05-10: 1 mg via INTRAVENOUS

## 2015-05-10 MED ORDER — SCOPOLAMINE 1 MG/3DAYS TD PT72
1.0000 | MEDICATED_PATCH | Freq: Once | TRANSDERMAL | Status: DC
  Administered 2015-05-10: 1.5 mg via TRANSDERMAL

## 2015-05-10 MED ORDER — SILVER SULFADIAZINE 1 % EX CREA
TOPICAL_CREAM | CUTANEOUS | Status: AC
  Filled 2015-05-10: qty 85

## 2015-05-10 MED ORDER — DOCUSATE SODIUM 100 MG PO CAPS: 100.0000 mg | ORAL_CAPSULE | Freq: Two times a day (BID) | ORAL | Status: DC

## 2015-05-10 MED ORDER — PROMETHAZINE HCL 25 MG/ML IJ SOLN: 6.2500 mg | INTRAMUSCULAR | Status: DC | PRN

## 2015-05-10 MED ORDER — KETOROLAC TROMETHAMINE 30 MG/ML IJ SOLN
INTRAMUSCULAR | Status: DC | PRN
  Administered 2015-05-10: 30 mg via INTRAVENOUS

## 2015-05-10 MED ORDER — IODINE STRONG (LUGOLS) 5 % PO SOLN
ORAL | Status: AC
  Filled 2015-05-10: qty 1

## 2015-05-10 MED ORDER — FENTANYL CITRATE (PF) 250 MCG/5ML IJ SOLN
INTRAMUSCULAR | Status: AC
  Filled 2015-05-10: qty 5

## 2015-05-10 MED ORDER — LACTATED RINGERS IV SOLN
INTRAVENOUS | Status: DC
  Administered 2015-05-10 (×2): via INTRAVENOUS

## 2015-05-10 MED ORDER — ONDANSETRON HCL 4 MG/2ML IJ SOLN
INTRAMUSCULAR | Status: AC
  Filled 2015-05-10: qty 2

## 2015-05-10 MED ORDER — LIDOCAINE HCL (CARDIAC) 20 MG/ML IV SOLN
INTRAVENOUS | Status: AC
  Filled 2015-05-10: qty 5

## 2015-05-10 MED ORDER — DEXAMETHASONE SODIUM PHOSPHATE 4 MG/ML IJ SOLN
INTRAMUSCULAR | Status: DC | PRN
  Administered 2015-05-10: 4 mg via INTRAVENOUS

## 2015-05-10 MED ORDER — SILVER SULFADIAZINE 1 % EX CREA
TOPICAL_CREAM | CUTANEOUS | Status: DC | PRN
  Administered 2015-05-10: 1 via TOPICAL

## 2015-05-10 MED ORDER — HYDROCODONE-ACETAMINOPHEN 5-325 MG PO TABS: ORAL_TABLET | ORAL | Status: DC

## 2015-05-10 MED ORDER — FERRIC SUBSULFATE 259 MG/GM EX SOLN
CUTANEOUS | Status: AC
  Filled 2015-05-10: qty 8

## 2015-05-10 MED ORDER — BUPIVACAINE IN DEXTROSE 0.75-8.25 % IT SOLN
INTRATHECAL | Status: DC | PRN
  Administered 2015-05-10: 1.2 mL via INTRATHECAL

## 2015-05-10 MED ORDER — ACETIC ACID 5 % SOLN
Status: DC | PRN
  Administered 2015-05-10: 1 via TOPICAL

## 2015-05-10 MED ORDER — FENTANYL CITRATE (PF) 100 MCG/2ML IJ SOLN: 25.0000 ug | INTRAMUSCULAR | Status: DC | PRN

## 2015-05-10 MED ORDER — ACETIC ACID 5 % SOLN
Status: AC
  Filled 2015-05-10: qty 500

## 2015-05-10 MED ORDER — SCOPOLAMINE 1 MG/3DAYS TD PT72
MEDICATED_PATCH | TRANSDERMAL | Status: AC
  Administered 2015-05-10: 1.5 mg via TRANSDERMAL
  Filled 2015-05-10: qty 1

## 2015-05-10 MED ORDER — KETOROLAC TROMETHAMINE 30 MG/ML IJ SOLN
INTRAMUSCULAR | Status: AC
  Filled 2015-05-10: qty 1

## 2015-05-10 MED ORDER — DEXAMETHASONE SODIUM PHOSPHATE 4 MG/ML IJ SOLN
INTRAMUSCULAR | Status: AC
  Filled 2015-05-10: qty 1

## 2015-05-10 MED ORDER — MIDAZOLAM HCL 2 MG/2ML IJ SOLN
INTRAMUSCULAR | Status: AC
  Filled 2015-05-10: qty 2

## 2015-05-10 MED ORDER — PROPOFOL 10 MG/ML IV BOLUS
INTRAVENOUS | Status: AC
  Filled 2015-05-10: qty 20

## 2015-05-10 MED ORDER — LACTATED RINGERS IV SOLN: INTRAVENOUS | Status: DC

## 2015-05-10 MED ORDER — ONDANSETRON HCL 4 MG/2ML IJ SOLN
INTRAMUSCULAR | Status: DC | PRN
  Administered 2015-05-10: 4 mg via INTRAVENOUS

## 2015-05-10 SURGICAL SUPPLY — 19 items
APPLICATOR COTTON TIP 6IN STRL (MISCELLANEOUS) IMPLANT
CANISTER SUCT 3000ML (MISCELLANEOUS) IMPLANT
CATH ROBINSON RED A/P 16FR (CATHETERS) IMPLANT
CLOTH BEACON ORANGE TIMEOUT ST (SAFETY) ×2 IMPLANT
DRAPE UNDERBUTTOCKS STRL (DRAPE) IMPLANT
GLOVE BIO SURGEON STRL SZ7 (GLOVE) ×2 IMPLANT
GLOVE BIOGEL PI IND STRL 7.0 (GLOVE) ×1 IMPLANT
GLOVE BIOGEL PI INDICATOR 7.0 (GLOVE) ×1
GOWN STRL REUS W/TWL LRG LVL3 (GOWN DISPOSABLE) ×4 IMPLANT
HOSE NS SMOKE EVAC 7/8 X6 (MISCELLANEOUS) ×2 IMPLANT
LEGGING LITHOTOMY PAIR STRL (DRAPES) IMPLANT
PAD OB MATERNITY 4.3X12.25 (PERSONAL CARE ITEMS) ×2 IMPLANT
PAD PREP 24X48 CUFFED NSTRL (MISCELLANEOUS) ×2 IMPLANT
PENCIL BUTTON HOLSTER BLD 10FT (ELECTRODE) IMPLANT
REDUCER FITTING SMOKE EVAC (MISCELLANEOUS) ×2 IMPLANT
SCOPETTES 8  STERILE (MISCELLANEOUS) ×1
SCOPETTES 8 STERILE (MISCELLANEOUS) ×1 IMPLANT
TOWEL OR 17X24 6PK STRL BLUE (TOWEL DISPOSABLE) ×4 IMPLANT
YANKAUER SUCT BULB TIP NO VENT (SUCTIONS) IMPLANT

## 2015-05-10 NOTE — Anesthesia Postprocedure Evaluation (Signed)
Anesthesia Post Note  Patient: Amber Klein  Procedure(s) Performed: Procedure(s) (LRB): CO2 LASER APPLICATION (N/A)  Patient location during evaluation: PACU Anesthesia Type: Spinal Level of consciousness: oriented and awake and alert Pain management: pain level controlled Vital Signs Assessment: post-procedure vital signs reviewed and stable Respiratory status: spontaneous breathing, respiratory function stable and patient connected to nasal cannula oxygen Cardiovascular status: blood pressure returned to baseline and stable Postop Assessment: no headache, no backache and spinal receding Anesthetic complications: no    Last Vitals:  Filed Vitals:   05/10/15 1415 05/10/15 1430  BP: 92/65 88/55  Pulse: 82 80  Temp:    Resp: 22 18    Last Pain: There were no vitals filed for this visit.               DENENNY,BRUCE J

## 2015-05-10 NOTE — Transfer of Care (Signed)
Immediate Anesthesia Transfer of Care Note  Patient: Amber Klein  Procedure(s) Performed: Procedure(s): CO2 LASER APPLICATION (N/A)  Patient Location: PACU  Anesthesia Type:Spinal  Level of Consciousness: awake, alert  and oriented  Airway & Oxygen Therapy: Patient Spontanous Breathing  Post-op Assessment: Report given to RN  Post vital signs: Reviewed  Last Vitals:  Filed Vitals:   05/10/15 1042  BP: 133/89  Pulse: 90  Temp: 36.9 C  Resp: 18    Complications: No apparent anesthesia complications

## 2015-05-10 NOTE — Op Note (Signed)
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 R/B/A of the procedure were reviewed at length and informed consent was obtained. Spinal anesthesia was administered and found to be adequate. The patient was placed in dorsal lithotomy and Acetic acid 5% was applied to the vulva for the above findings. The CO2 laser was set at 10 watts and the posterior vulva was vaporized. The bladder was in and out catheterized at the completion of the case and the patient was taken to the PACU in stable condition at the end of the procedure.

## 2015-05-10 NOTE — Discharge Instructions (Signed)

## 2015-05-10 NOTE — H&P (Signed)
Amber Klein is an 65 y.o. female.   Pt presents for CO2 laser of the vulva for high grade VIN. During her annual exam a white hued area on the vulva was noted. Bx showed VIN3. There were 2 other white colored areas on the vulva that were noted at the same tome but were not contiguous with the area that was biopsied. These areas are also presumed to be high grade Vin. Given multifocal nature of disease the decision to perform a CO2 laser was made. R/B/A of the procedure were reviewd and the patient wishes to proceed  No LMP recorded. Patient is postmenopausal.    Past Medical History  Diagnosis Date  . Dysrhythmia   . GERD (gastroesophageal reflux disease)   . Arthritis   . Sinus infection   . Complication of anesthesia     "I went into shock". Decreased body temperature.    Past Surgical History  Procedure Laterality Date  . Knee surgery    . Foot surgery    . Cesarean section    . Orif elbow fracture Left 07/04/2014    Procedure: OPEN REDUCTION INTERNAL FIXATION (ORIF) ELBOW/OLECRANON FRACTURE;  Surgeon: Meredith Pel, MD;  Location: [facility] ORS;  Service: Orthopedics;  Laterality: Left;  . Cataract extraction    . Refractive surgery    . Colonoscopy      History reviewed. No pertinent family history.  Social History:  reports that she has been smoking Cigarettes.  She has a 4 pack-year smoking history. She has never used smokeless tobacco. She reports that she drinks alcohol. She reports that she does not use illicit drugs.  Allergies:  Allergies  Allergen Reactions  . Iodinated Diagnostic Agents Anaphylaxis  . Propofol Anaphylaxis  . Shellfish Allergy Anaphylaxis  . Ciprofloxacin Swelling    Hives  . Sulfa Antibiotics Swelling    Migraine  . Zithromax [Azithromycin] Hives    Prescriptions prior to admission  Medication Sig Dispense Refill Last Dose  . acetaminophen (TYLENOL) 500 MG tablet Take 500 mg by mouth every 6 (six) hours as needed.   Past Week at  Unknown time  . estradiol (ESTRACE) 1 MG tablet Take 1 mg by mouth daily.   05/09/2015 at Unknown time  . latanoprost (XALATAN) 0.005 % ophthalmic solution Place 1 drop into both eyes at bedtime.     Marland Kitchen omeprazole (PRILOSEC) 20 MG capsule Take 20 mg by mouth every other day as needed (heartburn).   05/10/2015 at Unknown time  . ibuprofen (ADVIL) 200 MG tablet Take 1 tablet (200 mg total) by mouth every 6 (six) hours as needed. 30 tablet 0 More than a month at Unknown time  . Multiple Vitamin (MULTIVITAMIN WITH MINERALS) TABS tablet Take 1 tablet by mouth every other day.   Past Week at Unknown time  . progesterone (PROMETRIUM) 100 MG capsule Take 100 mg by mouth as directed.   05/26/2014 at Unknown time    ROS: as above  Blood pressure 133/89, pulse 90, temperature 98.4 F (36.9 C), temperature source Oral, resp. rate 18, SpO2 97 %. Physical Exam   AOX3, NAD Normal work of breathing abd soft  No results found for this or any previous visit (from the past 24 hour(s)).  No results found.  Assessment/Plan: 1) Admit 2) CO2 laser of vulva  ROSS,KENDRA H. 05/10/2015, 11:33 AM

## 2015-05-10 NOTE — Anesthesia Procedure Notes (Signed)
Spinal Patient location during procedure: OR Staffing Anesthesiologist: Franne Grip Preanesthetic Checklist Completed: patient identified, site marked, surgical consent, pre-op evaluation, timeout performed, IV checked, risks and benefits discussed and monitors and equipment checked Spinal Block Patient position: sitting Prep: DuraPrep Patient monitoring: heart rate, cardiac monitor, continuous pulse ox and blood pressure Approach: midline Location: L4-5 Injection technique: single-shot Needle Needle type: Pencan  Needle gauge: 24 G Needle length: 10 cm Additional Notes Tolerated well.

## 2015-05-11 ENCOUNTER — Encounter (HOSPITAL_COMMUNITY): Payer: Self-pay | Admitting: Obstetrics and Gynecology

## 2016-02-14 DIAGNOSIS — J189 Pneumonia, unspecified organism: Secondary | ICD-10-CM

## 2016-02-14 HISTORY — DX: Pneumonia, unspecified organism: J18.9

## 2016-06-02 ENCOUNTER — Other Ambulatory Visit: Payer: Self-pay | Admitting: Obstetrics and Gynecology

## 2016-06-02 DIAGNOSIS — R5381 Other malaise: Secondary | ICD-10-CM

## 2016-06-05 ENCOUNTER — Other Ambulatory Visit: Payer: Self-pay | Admitting: Obstetrics and Gynecology

## 2016-06-05 DIAGNOSIS — E2839 Other primary ovarian failure: Secondary | ICD-10-CM

## 2016-06-06 ENCOUNTER — Other Ambulatory Visit: Payer: Self-pay | Admitting: Pediatrics

## 2016-06-14 ENCOUNTER — Ambulatory Visit
Admission: RE | Admit: 2016-06-14 | Discharge: 2016-06-14 | Disposition: A | Discharge status: Home / Self Care | Payer: 59 | Source: Ambulatory Visit | Attending: Obstetrics and Gynecology | Admitting: Obstetrics and Gynecology

## 2016-06-14 DIAGNOSIS — E2839 Other primary ovarian failure: Secondary | ICD-10-CM

## 2016-08-04 ENCOUNTER — Other Ambulatory Visit: Payer: Self-pay | Admitting: Obstetrics and Gynecology

## 2016-08-07 NOTE — Patient Instructions (Signed)
Your procedure is scheduled on:  Thursday, August 10, 2016  Enter through the [employer] of [facility] at:  9:00 AM  Pick up the phone at the desk and dial [phone].  Call this number if you have problems the morning of surgery: [phone].  Remember: Do NOT eat food or drink after:  Midnight Wednesday  Take these medicines the morning of surgery with a SIP OF WATER:  None  Stop ALL herbal medications, Omega 3/Fish oil, and Turmeric  at this time  Do NOT smoke the day of surgery.  Do NOT wear jewelry (body piercing), metal hair clips/bobby pins, make-up, artifical eyelashes or nail polish. Do NOT wear lotions, powders, or perfumes.  You may wear deodorant. Do NOT shave for 48 hours prior to surgery. Do NOT bring valuables to the hospital. Contacts, dentures, or bridgework may not be worn into surgery.  Have a responsible adult drive you home and stay with you for 24 hours after your procedure  Bring a copy of your healthcare power of attorney and living will documents.

## 2016-08-08 ENCOUNTER — Encounter (HOSPITAL_COMMUNITY)
Admission: RE | Admit: 2016-08-08 | Discharge: 2016-08-08 | Disposition: A | Discharge status: Home / Self Care | Payer: 59 | Source: Ambulatory Visit | Attending: Obstetrics and Gynecology | Admitting: Obstetrics and Gynecology

## 2016-08-08 ENCOUNTER — Encounter (HOSPITAL_COMMUNITY): Payer: Self-pay

## 2016-08-08 DIAGNOSIS — D071 Carcinoma in situ of vulva: Secondary | ICD-10-CM | POA: Diagnosis present

## 2016-08-08 DIAGNOSIS — M1990 Unspecified osteoarthritis, unspecified site: Secondary | ICD-10-CM | POA: Diagnosis not present

## 2016-08-08 DIAGNOSIS — Z91041 Radiographic dye allergy status: Secondary | ICD-10-CM | POA: Diagnosis not present

## 2016-08-08 DIAGNOSIS — Z882 Allergy status to sulfonamides status: Secondary | ICD-10-CM | POA: Diagnosis not present

## 2016-08-08 DIAGNOSIS — Z888 Allergy status to other drugs, medicaments and biological substances status: Secondary | ICD-10-CM | POA: Diagnosis not present

## 2016-08-08 DIAGNOSIS — Z79899 Other long term (current) drug therapy: Secondary | ICD-10-CM | POA: Diagnosis not present

## 2016-08-08 DIAGNOSIS — Z881 Allergy status to other antibiotic agents status: Secondary | ICD-10-CM | POA: Diagnosis not present

## 2016-08-08 DIAGNOSIS — Z87891 Personal history of nicotine dependence: Secondary | ICD-10-CM | POA: Diagnosis not present

## 2016-08-08 DIAGNOSIS — Z91040 Latex allergy status: Secondary | ICD-10-CM | POA: Diagnosis not present

## 2016-08-08 DIAGNOSIS — I4510 Unspecified right bundle-branch block: Secondary | ICD-10-CM | POA: Diagnosis not present

## 2016-08-08 HISTORY — DX: Otitis media, unspecified, unspecified ear: H66.90

## 2016-08-08 HISTORY — DX: Pneumonia, unspecified organism: J18.9

## 2016-08-08 HISTORY — DX: Other allergic rhinitis: J30.89

## 2016-08-08 HISTORY — DX: Unspecified right bundle-branch block: I45.10

## 2016-08-08 LAB — CBC
HCT: 43.2 % (ref 36.0–46.0)
Hemoglobin: 14.2 g/dL (ref 12.0–15.0)
MCH: 29.9 pg (ref 26.0–34.0)
MCHC: 32.9 g/dL (ref 30.0–36.0)
MCV: 90.9 fL (ref 78.0–100.0)
Platelets: 383 10*3/uL (ref 150–400)
RBC: 4.75 MIL/uL (ref 3.87–5.11)
RDW: 14.1 % (ref 11.5–15.5)
WBC: 8.7 10*3/uL (ref 4.0–10.5)

## 2016-08-10 ENCOUNTER — Ambulatory Visit (HOSPITAL_COMMUNITY): Payer: 59 | Admitting: Anesthesiology

## 2016-08-10 ENCOUNTER — Encounter (HOSPITAL_COMMUNITY): Payer: Self-pay | Admitting: *Deleted

## 2016-08-10 ENCOUNTER — Encounter (HOSPITAL_COMMUNITY)
Admission: RE | Disposition: A | Discharge status: Home / Self Care | Payer: Self-pay | Source: Ambulatory Visit | Attending: Obstetrics and Gynecology

## 2016-08-10 ENCOUNTER — Ambulatory Visit (HOSPITAL_COMMUNITY)
Admission: RE | Admit: 2016-08-10 | Discharge: 2016-08-10 | Disposition: A | Discharge status: Home / Self Care | Payer: 59 | Source: Ambulatory Visit | Attending: Obstetrics and Gynecology | Admitting: Obstetrics and Gynecology

## 2016-08-10 DIAGNOSIS — Z79899 Other long term (current) drug therapy: Secondary | ICD-10-CM | POA: Insufficient documentation

## 2016-08-10 DIAGNOSIS — D071 Carcinoma in situ of vulva: Secondary | ICD-10-CM | POA: Diagnosis not present

## 2016-08-10 DIAGNOSIS — Z91041 Radiographic dye allergy status: Secondary | ICD-10-CM | POA: Insufficient documentation

## 2016-08-10 DIAGNOSIS — I4510 Unspecified right bundle-branch block: Secondary | ICD-10-CM | POA: Insufficient documentation

## 2016-08-10 DIAGNOSIS — Z91040 Latex allergy status: Secondary | ICD-10-CM | POA: Insufficient documentation

## 2016-08-10 DIAGNOSIS — M1990 Unspecified osteoarthritis, unspecified site: Secondary | ICD-10-CM | POA: Insufficient documentation

## 2016-08-10 DIAGNOSIS — Z888 Allergy status to other drugs, medicaments and biological substances status: Secondary | ICD-10-CM | POA: Insufficient documentation

## 2016-08-10 DIAGNOSIS — Z881 Allergy status to other antibiotic agents status: Secondary | ICD-10-CM | POA: Insufficient documentation

## 2016-08-10 DIAGNOSIS — Z87891 Personal history of nicotine dependence: Secondary | ICD-10-CM | POA: Insufficient documentation

## 2016-08-10 DIAGNOSIS — Z882 Allergy status to sulfonamides status: Secondary | ICD-10-CM | POA: Insufficient documentation

## 2016-08-10 HISTORY — PX: VULVECTOMY: SHX1086

## 2016-08-10 SURGERY — WIDE EXCISION VULVECTOMY
Anesthesia: Choice | Site: Vulva

## 2016-08-10 MED ORDER — ONDANSETRON HCL 4 MG/2ML IJ SOLN
INTRAMUSCULAR | Status: AC
  Filled 2016-08-10: qty 2

## 2016-08-10 MED ORDER — LIDOCAINE HCL 1 % IJ SOLN
INTRAMUSCULAR | Status: DC | PRN
  Administered 2016-08-10: 10 mL

## 2016-08-10 MED ORDER — ACETIC ACID 5 % SOLN
Status: DC | PRN
  Administered 2016-08-10: 1 via TOPICAL

## 2016-08-10 MED ORDER — LIDOCAINE HCL 1 % IJ SOLN
INTRAMUSCULAR | Status: AC
  Filled 2016-08-10: qty 20

## 2016-08-10 MED ORDER — ONDANSETRON HCL 4 MG/2ML IJ SOLN
INTRAMUSCULAR | Status: DC | PRN
  Administered 2016-08-10: 4 mg via INTRAVENOUS

## 2016-08-10 MED ORDER — HYDROCODONE-ACETAMINOPHEN 5-325 MG PO TABS: ORAL_TABLET | ORAL | 0 refills | Status: DC

## 2016-08-10 MED ORDER — BUPIVACAINE IN DEXTROSE 0.75-8.25 % IT SOLN
INTRATHECAL | Status: DC | PRN
  Administered 2016-08-10: 6 mg via INTRATHECAL

## 2016-08-10 MED ORDER — MIDAZOLAM HCL 2 MG/2ML IJ SOLN
INTRAMUSCULAR | Status: AC
  Filled 2016-08-10: qty 2

## 2016-08-10 MED ORDER — FENTANYL CITRATE (PF) 100 MCG/2ML IJ SOLN
INTRAMUSCULAR | Status: AC
  Filled 2016-08-10: qty 2

## 2016-08-10 MED ORDER — IBUPROFEN 600 MG PO TABS: 600.0000 mg | ORAL_TABLET | Freq: Four times a day (QID) | ORAL | 0 refills | Status: DC | PRN

## 2016-08-10 MED ORDER — LIDOCAINE HCL (CARDIAC) 20 MG/ML IV SOLN
INTRAVENOUS | Status: AC
  Filled 2016-08-10: qty 5

## 2016-08-10 MED ORDER — LACTATED RINGERS IV SOLN
INTRAVENOUS | Status: DC
  Administered 2016-08-10: 10:00:00 via INTRAVENOUS

## 2016-08-10 MED ORDER — ACETIC ACID 5 % SOLN
Status: AC
  Filled 2016-08-10: qty 500

## 2016-08-10 MED ORDER — MIDAZOLAM HCL 5 MG/5ML IJ SOLN
INTRAMUSCULAR | Status: DC | PRN
  Administered 2016-08-10: 1 mg via INTRAVENOUS

## 2016-08-10 MED ORDER — BUPIVACAINE IN DEXTROSE 0.75-8.25 % IT SOLN
INTRATHECAL | Status: AC
Start: 2016-08-10 — End: 2016-08-10
  Filled 2016-08-10: qty 2

## 2016-08-10 MED ORDER — DOCUSATE SODIUM 100 MG PO CAPS: 100.0000 mg | ORAL_CAPSULE | Freq: Two times a day (BID) | ORAL | 0 refills | Status: DC

## 2016-08-10 MED ORDER — LIDOCAINE HCL (CARDIAC) 20 MG/ML IV SOLN
INTRAVENOUS | Status: DC | PRN
  Administered 2016-08-10: 30 mg via INTRAVENOUS

## 2016-08-10 SURGICAL SUPPLY — 23 items
BLADE SURG 15 STRL LF C SS BP (BLADE) ×1 IMPLANT
BLADE SURG 15 STRL SS (BLADE) ×2
CLOTH BEACON ORANGE TIMEOUT ST (SAFETY) ×2 IMPLANT
CONTAINER PREFILL 10% NBF 15ML (MISCELLANEOUS) ×2 IMPLANT
COUNTER NEEDLE 1200 MAGNETIC (NEEDLE) ×2 IMPLANT
ELECT REM PT RETURN 9FT ADLT (ELECTROSURGICAL) ×2
ELECTRODE REM PT RTRN 9FT ADLT (ELECTROSURGICAL) ×1 IMPLANT
GLOVE BIO SURGEON STRL SZ7 (GLOVE) ×2 IMPLANT
GLOVE BIOGEL PI IND STRL 7.0 (GLOVE) ×1 IMPLANT
GLOVE BIOGEL PI INDICATOR 7.0 (GLOVE) ×1
NEEDLE HYPO 22GX1.5 SAFETY (NEEDLE) ×2 IMPLANT
NS IRRIG 1000ML POUR BTL (IV SOLUTION) ×2 IMPLANT
PACK VAGINAL MINOR WOMEN LF (CUSTOM PROCEDURE TRAY) ×2 IMPLANT
PAD PREP 24X48 CUFFED NSTRL (MISCELLANEOUS) ×2 IMPLANT
PEN SKIN MARKING STD PT W/RULR (MISCELLANEOUS) ×2 IMPLANT
PENCIL BUTTON HOLSTER BLD 10FT (ELECTRODE) ×2 IMPLANT
SUT VIC AB 2-0 CT1 18 (SUTURE) ×2 IMPLANT
SUT VIC AB 2-0 SH 27 (SUTURE) ×2
SUT VIC AB 2-0 SH 27XBRD (SUTURE) ×1 IMPLANT
SYR 20CC LL (SYRINGE) ×2 IMPLANT
TOWEL OR 17X24 6PK STRL BLUE (TOWEL DISPOSABLE) ×4 IMPLANT
TUBING NON-CON 1/4 X 20 CONN (TUBING) ×2 IMPLANT
YANKAUER SUCT BULB TIP NO VENT (SUCTIONS) ×2 IMPLANT

## 2016-08-10 NOTE — H&P (Signed)
Amber Klein is an 66 y.o. female.   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 She has a h/o prior treatmetn with CO@ laser d/t multifocal disease. The current area of disease appears to be unifocal. Therefore, excision was recommended. R/B/A reviewed and she wishes to proceed    No LMP recorded. Patient is postmenopausal.    Past Medical History:  Diagnosis Date  . Arthritis   . Complication of anesthesia    "I went into shock". Decreased body temperature., WILL ONLY HAVE SURGERY WITH SPINAL ANESTHESIA ONLY!!!  . Dysrhythmia   . Ear infection   . Environmental and seasonal allergies   . GERD (gastroesophageal reflux disease)    no longer having issues takes TUMS occ.  . Pneumonia 02/2016   walking pneumonia   . RBBB   . Sinus infection     Past Surgical History:  Procedure Laterality Date  . CATARACT EXTRACTION    . CESAREAN SECTION    . CO2 LASER APPLICATION N/A 05/06/5571   Procedure: CO2 LASER APPLICATION;  Surgeon: Vanessa Kick, MD;  Location: [facility];  Service: Gynecology;  Laterality: N/A;  . COLONOSCOPY    . FOOT SURGERY    . KNEE SURGERY    . ORIF ELBOW FRACTURE Left 07/04/2014   Procedure: OPEN REDUCTION INTERNAL FIXATION (ORIF) ELBOW/OLECRANON FRACTURE;  Surgeon: Meredith Pel, MD;  Location: [facility];  Service: Orthopedics;  Laterality: Left;  . REFRACTIVE SURGERY      History reviewed. No pertinent family history.  Social History:  reports that she quit smoking about 2 years ago. Her smoking use included Cigarettes. She has a 4.00 pack-year smoking history. She has never used smokeless tobacco. She reports that she drinks alcohol. She reports that she does not use drugs.  Allergies:  Allergies  Allergen Reactions  . Iodinated Diagnostic Agents Anaphylaxis  . Propofol Anaphylaxis  . Shellfish Allergy Anaphylaxis  . Ciprofloxacin Swelling    Hives  . Sulfa Antibiotics Swelling    Migraine  . Zithromax [Azithromycin] Hives  .  Latex Rash    Sensitivity on too long    Prescriptions Prior to Admission  Medication Sig Dispense Refill Last Dose  . Cholecalciferol (VITAMIN D3) 1000 units CAPS Take 1,000 Units by mouth daily.   Past Week at Unknown time  . estradiol (ESTRACE) 2 MG tablet Take 1 mg by mouth daily.   08/10/2016 at [place]  . fexofenadine-pseudoephedrine (ALLEGRA-D) 60-120 MG 12 hr tablet Take 1 tablet by mouth daily as needed (allergies).   Past Week at Unknown time  . latanoprost (XALATAN) 0.005 % ophthalmic solution Place 1 drop into both eyes daily.    08/09/2016 at Unknown time  . Multiple Vitamin (MULTIVITAMIN WITH MINERALS) TABS tablet Take 1 tablet by mouth daily.    Past Week at Unknown time  . Multiple Vitamins-Calcium (VIACTIV MULTI-VITAMIN) CHEW Chew 1 tablet by mouth daily.   Past Week at Unknown time  . Omega-3 Fatty Acids (FISH OIL) 1000 MG CAPS Take 1,000 mg by mouth daily.   Past Week at Unknown time  . progesterone (PROMETRIUM) 200 MG capsule Take 200 mgs once daily for 10 days every other month   Past Month at Unknown time  . Turmeric 500 MG TABS Take 500 mg by mouth daily.   Past Week at Unknown time    ROS  Blood pressure 106/89, pulse 83, temperature 98.3 F (36.8 C), temperature source Oral, resp. rate 16, SpO2 97 %. Physical Exam  AOx3, NAD Normal breating effort  No results found for this or any previous visit (from the past 24 hour(s)).  No results found.  Assessment/Plan: Wide local excision of vulva  ROSS,KENDRA H. 08/10/2016, 10:25 AM

## 2016-08-10 NOTE — Transfer of Care (Signed)
Immediate Anesthesia Transfer of Care Note  Patient: Garvin Fila Cabello  Procedure(s) Performed: Procedure(s): WIDE EXCISION VULVECTOMY (N/A)  Patient Location: PACU  Anesthesia Type:Spinal  Level of Consciousness: awake, alert , oriented and patient cooperative  Airway & Oxygen Therapy: Patient Spontanous Breathing  Post-op Assessment: Report given to RN and Post -op Vital signs reviewed and stable  Post vital signs: Reviewed and stable  Last Vitals:  Vitals:   08/10/16 0915  BP: 106/89  Pulse: 83  Resp: 16  Temp: 36.8 C    Last Pain:  Vitals:   08/10/16 0915  TempSrc: Oral      Patients Stated Pain Goal: 6 (58/25/18 9842)  Complications: No apparent anesthesia complications

## 2016-08-10 NOTE — Anesthesia Procedure Notes (Signed)
Spinal  Patient location during procedure: OR Start time: 08/10/2016 10:34 AM End time: 08/10/2016 10:43 AM Staffing Anesthesiologist: Duane Boston Performed: anesthesiologist  Preanesthetic Checklist Completed: patient identified, surgical consent, pre-op evaluation, timeout performed, IV checked, risks and benefits discussed and monitors and equipment checked Spinal Block Patient position: sitting Prep: DuraPrep Patient monitoring: cardiac monitor, continuous pulse ox and blood pressure Approach: midline Location: L3-4 Injection technique: single-shot Needle Needle type: Pencan  Needle gauge: 24 G Needle length: 9 cm Additional Notes Functioning IV was confirmed and monitors were applied. Sterile prep and drape, including hand hygiene and sterile gloves were used. The patient was positioned and the spine was prepped. The skin was anesthetized with lidocaine.  Free flow of clear CSF was obtained prior to injecting local anesthetic into the CSF.  The spinal needle aspirated freely following injection.  The needle was carefully withdrawn.  The patient tolerated the procedure well.

## 2016-08-10 NOTE — Anesthesia Preprocedure Evaluation (Addendum)
Anesthesia Evaluation  Patient identified by MRN, date of birth, ID band Patient awake    Reviewed: Allergy & Precautions, NPO status , Patient's Chart, lab work & pertinent test results  Airway Mallampati: II  TM Distance: >3 FB Neck ROM: Full    Dental no notable dental hx. (+) Dental Advisory Given   Pulmonary neg pulmonary ROS, former smoker,    Pulmonary exam normal        Cardiovascular negative cardio ROS Normal cardiovascular exam     Neuro/Psych negative neurological ROS  negative psych ROS   GI/Hepatic Neg liver ROS, GERD  ,  Endo/Other  negative endocrine ROS  Renal/GU negative Renal ROS     Musculoskeletal   Abdominal   Peds  Hematology   Anesthesia Other Findings   Reproductive/Obstetrics                            Anesthesia Physical Anesthesia Plan  ASA: II  Anesthesia Plan:    Post-op Pain Management:    Induction:   PONV Risk Score and Plan: 2 and Ondansetron and Dexamethasone  Airway Management Planned: Natural Airway  Additional Equipment:   Intra-op Plan:   Post-operative Plan:   Informed Consent: I have reviewed the patients History and Physical, chart, labs and discussed the procedure including the risks, benefits and alternatives for the proposed anesthesia with the patient or authorized representative who has indicated his/her understanding and acceptance.   Dental advisory given  Plan Discussed with: Anesthesiologist  Anesthesia Plan Comments: (Prefers Mac with Local or SAB.  Will discuss with surgeon)       Anesthesia Quick Evaluation

## 2016-08-10 NOTE — Anesthesia Postprocedure Evaluation (Signed)
Anesthesia Post Note  Patient: Amber Klein  Procedure(s) Performed: Procedure(s) (LRB): WIDE EXCISION VULVECTOMY (N/A)     Patient location during evaluation: PACU Anesthesia Type: Spinal and MAC Level of consciousness: awake and alert Pain management: pain level controlled Vital Signs Assessment: post-procedure vital signs reviewed and stable Respiratory status: spontaneous breathing and respiratory function stable Cardiovascular status: blood pressure returned to baseline and stable Postop Assessment: spinal receding Anesthetic complications: no    Last Vitals:  Vitals:   08/10/16 1215 08/10/16 1230  BP: 101/90 101/84  Pulse: 82 76  Resp: 20 20  Temp:      Last Pain:  Vitals:   08/10/16 0915  TempSrc: Oral   Pain Goal: Patients Stated Pain Goal: 6 (08/10/16 0915)               Duane Boston DANIEL

## 2016-08-10 NOTE — Op Note (Signed)
Pre-Operative Diagnosis: 1) recurrent high grade Vulvar intraepithelial neoplasia Postoperative diagnoses:1) recurrent high grade Vulvar intraepithelial neoplasia Procedure: wide local excision of vulva Surgeon: Dr. Vanessa Kick Asst.: None Anesthesia: Spinal  Operative findings: Acetic acid was applied to the vulva. 3 areas of acetowhite staining were noted involving the left labia majora. Each lesion appeared to be 5 mm. A 1 cm margin was a obtained on the left lateral portion of the excision. Due to the proximity to the introitus on the medial margin a 5 mm margin was obtained. Specimen: Wide local excision of left labia majora. Long suture marks 12:00, short suture marked 3:00 EBL: Minimal  Procedure:Ms. Pickler is a 66 year old female who presents for surgical management of recurrent VIN-3, high-grade vulvar intraepithelial neoplasia. Risks/benefits/alternatives of the procedure were discussed with the patient at length prior to the procedure the patient wished to proceed. Following the appropriate informed consent the patient was taken to the operating room where spinal anesthesia was administered. She was placed in the dorsal lithotomy position in [place]. Prior to initiating the surgical procedure the patient was appropriately identified in a preoperative timeout procedure. The surgical area was prepped and draped in the normal sterile fashion. 5% acetic acid was applied to the vulva with the resultant findings as noted above. A surgical marker was used to mark out the surgical area of excision. An elliptical incision was made and carried down through the dermis to the subcutaneous fatty tissue. The vulvar skin was completely excised. The underlying subcutaneous tissue was reapproximated in layers to minimize tension on the suture lines. Several interrupted dermal sutures were placed. And the skin was reapproximated in a running fashion. All sponge lap and needle counts were correct 2  and the patient tolerated the procedure well and was transferred to the recovery room in stable condition following the procedure

## 2016-08-11 ENCOUNTER — Encounter (HOSPITAL_COMMUNITY): Payer: Self-pay | Admitting: Obstetrics and Gynecology

## 2016-09-08 ENCOUNTER — Inpatient Hospital Stay (HOSPITAL_COMMUNITY)
Admission: AD | Admit: 2016-09-08 | Discharge: 2016-09-08 | Disposition: A | Discharge status: Home / Self Care | Payer: 59 | Source: Ambulatory Visit | Attending: Obstetrics and Gynecology | Admitting: Obstetrics and Gynecology

## 2016-09-08 ENCOUNTER — Encounter (HOSPITAL_COMMUNITY): Payer: Self-pay

## 2016-09-08 DIAGNOSIS — L929 Granulomatous disorder of the skin and subcutaneous tissue, unspecified: Secondary | ICD-10-CM | POA: Diagnosis not present

## 2016-09-08 DIAGNOSIS — Z87891 Personal history of nicotine dependence: Secondary | ICD-10-CM | POA: Diagnosis not present

## 2016-09-08 LAB — CBC WITH DIFFERENTIAL/PLATELET
Basophils Absolute: 0 10*3/uL (ref 0.0–0.1)
Basophils Relative: 0 %
Eosinophils Absolute: 0.2 10*3/uL (ref 0.0–0.7)
Eosinophils Relative: 2 %
HCT: 41.4 % (ref 36.0–46.0)
Hemoglobin: 13.8 g/dL (ref 12.0–15.0)
Lymphocytes Relative: 32 %
Lymphs Abs: 3 10*3/uL (ref 0.7–4.0)
MCH: 30.3 pg (ref 26.0–34.0)
MCHC: 33.3 g/dL (ref 30.0–36.0)
MCV: 90.8 fL (ref 78.0–100.0)
Monocytes Absolute: 0.4 10*3/uL (ref 0.1–1.0)
Monocytes Relative: 4 %
Neutro Abs: 5.6 10*3/uL (ref 1.7–7.7)
Neutrophils Relative %: 62 %
Platelets: 394 10*3/uL (ref 150–400)
RBC: 4.56 MIL/uL (ref 3.87–5.11)
RDW: 13.9 % (ref 11.5–15.5)
WBC: 9.2 10*3/uL (ref 4.0–10.5)

## 2016-09-08 NOTE — MAU Note (Signed)
Urine in the lab  

## 2016-09-08 NOTE — Discharge Instructions (Signed)
How to Take a Sitz Bath °A sitz bath is a warm water bath that is taken while you are sitting down. The water should only come up to your hips and should cover your buttocks. Your health care provider may recommend a sitz bath to help you: °· Clean the lower part of your body, including your genital area. °· With itching. °· With pain. °· With sore muscles or muscles that tighten or spasm. ° °How to take a sitz bath °Take 3-4 sitz baths per day or as told by your health care provider. °1. Partially fill a bathtub with warm water. You will only need the water to be deep enough to cover your hips and buttocks when you are sitting in it. °2. If your health care provider told you to put medicine in the water, follow the directions exactly. °3. Sit in the water and open the tub drain a little. °4. Turn on the warm water again to keep the tub at the correct level. Keep the water running constantly. °5. Soak in the water for 15-20 minutes or as told by your health care provider. °6. After the sitz bath, pat the affected area dry first. Do not rub it. °7. Be careful when you stand up after the sitz bath because you may feel dizzy. ° °Contact a health care provider if: °· Your symptoms get worse. Do not continue with sitz baths if your symptoms get worse. °· You have new symptoms. Do not continue with sitz baths until you talk with your health care provider. °This information is not intended to replace advice given to you by your health care provider. Make sure you discuss any questions you have with your health care provider. °Document Released: 10/23/2003 Document Revised: 06/30/2015 Document Reviewed: 01/28/2014 °Elsevier Interactive Patient Education © 2018 [employer] ° °

## 2016-09-08 NOTE — MAU Provider Note (Signed)
History     CSN: [id number]  Arrival date and time: 09/08/16 1620   First Provider Initiated Contact with Patient 09/08/16 1741      Chief Complaint  Patient presents with  . possible abscess   Amber Klein is a 66 y.o. G4P2020 at 4 wks post op vulvectomy for VIN-III presenting with concern she may have a vulvar abscess. Earlier today she felt a new firm tender lump on left vulva, then called the office and was advised to come to MAU. After the call she felt a pop and noted bloody greenish material on her toilet tissue.      OB History  Gravida Para Term Preterm AB Living  4 2 2   2     SAB TAB Ectopic Multiple Live Births  1   1   2     # Outcome Date GA Lbr Len/2nd Weight Sex Delivery Anes PTL Lv  4 SAB           3 Ectopic           2 Term           1 Term                Past Medical History:  Diagnosis Date  . Arthritis   . Complication of anesthesia    "I went into shock". Decreased body temperature., WILL ONLY HAVE SURGERY WITH SPINAL ANESTHESIA ONLY!!!  . Dysrhythmia   . Ear infection   . Environmental and seasonal allergies   . GERD (gastroesophageal reflux disease)    no longer having issues takes TUMS occ.  . Pneumonia 02/2016   walking pneumonia   . RBBB   . Sinus infection     Past Surgical History:  Procedure Laterality Date  . CATARACT EXTRACTION    . CESAREAN SECTION    . CO2 LASER APPLICATION N/A 5/36/4680   Procedure: CO2 LASER APPLICATION;  Surgeon: Vanessa Kick, MD;  Location: [facility];  Service: Gynecology;  Laterality: N/A;  . COLONOSCOPY    . FOOT SURGERY    . KNEE SURGERY    . ORIF ELBOW FRACTURE Left 07/04/2014   Procedure: OPEN REDUCTION INTERNAL FIXATION (ORIF) ELBOW/OLECRANON FRACTURE;  Surgeon: Meredith Pel, MD;  Location: [facility];  Service: Orthopedics;  Laterality: Left;  . REFRACTIVE SURGERY    . VULVECTOMY N/A 08/10/2016   Procedure: WIDE EXCISION VULVECTOMY;  Surgeon: Vanessa Kick, MD;  Location: [facility];  Service:  Gynecology;  Laterality: N/A;    History reviewed. No pertinent family history.  Social History  Substance Use Topics  . Smoking status: Former Smoker    Packs/day: 0.10    Years: 40.00    Types: Cigarettes    Quit date: 08/09/2014  . Smokeless tobacco: Never Used  . Alcohol use Yes     Comment: 1/2 beer daily    Allergies:  Allergies  Allergen Reactions  . Iodinated Diagnostic Agents Anaphylaxis  . Propofol Anaphylaxis  . Shellfish Allergy Anaphylaxis  . Ciprofloxacin Swelling    Hives  . Iodine   . Sulfa Antibiotics Swelling    Migraine  . Zithromax [Azithromycin] Hives  . Latex Rash    Sensitivity on too long    Prescriptions Prior to Admission  Medication Sig Dispense Refill Last Dose  . Ca Phosphate-Cholecalciferol (CALCIUM 500 + D3) 250-500 MG-UNIT CHEW Chew 1 tablet by mouth daily.   09/08/2016 at Unknown time  . Cholecalciferol (VITAMIN D3) 1000 units CAPS Take 1,000 Units by  mouth daily.   09/08/2016 at Unknown time  . docusate sodium (COLACE) 100 MG capsule Take 1 capsule (100 mg total) by mouth 2 (two) times daily. 60 capsule 0 Past Month at Unknown time  . estradiol (ESTRACE) 2 MG tablet Take 1 mg by mouth daily.   09/08/2016 at Unknown time  . fexofenadine-pseudoephedrine (ALLEGRA-D) 60-120 MG 12 hr tablet Take 1 tablet by mouth daily as needed (allergies).   09/08/2016 at Unknown time  . HYDROcodone-acetaminophen (NORCO/VICODIN) 5-325 MG tablet 1-2 tablets every 4-6 hours as needed for pain 30 tablet 0 Past Month at Unknown time  . latanoprost (XALATAN) 0.005 % ophthalmic solution Place 1 drop into both eyes daily.    09/08/2016 at Unknown time  . Multiple Vitamin (MULTIVITAMIN WITH MINERALS) TABS tablet Take 1 tablet by mouth daily.    09/08/2016 at Unknown time  . Multiple Vitamins-Calcium (VIACTIV MULTI-VITAMIN) CHEW Chew 1 tablet by mouth daily.   09/08/2016 at Unknown time  . progesterone (PROMETRIUM) 200 MG capsule Take 200 mgs once daily for 10 days every  other month   JUNE  . ibuprofen (ADVIL,MOTRIN) 600 MG tablet Take 1 tablet (600 mg total) by mouth every 6 (six) hours as needed. (Patient not taking: Reported on 09/08/2016) 90 tablet 0 Not Taking at Unknown time    Review of Systems  Constitutional: Negative for chills, diaphoresis, fatigue and fever.  Gastrointestinal: Negative for abdominal pain and anal bleeding.  Genitourinary: Negative for hematuria, pelvic pain, vaginal bleeding, vaginal discharge and vaginal pain.  Musculoskeletal: Negative for back pain.  Skin:       See HPI   Physical Exam   Blood pressure 120/86, pulse 89, temperature 98.8 F (37.1 C), temperature source Oral, resp. rate 16, weight 156 lb 8 oz (71 kg), SpO2 96 %.  Physical Exam  Nursing note and vitals reviewed. Constitutional: She is oriented to person, place, and time. She appears well-developed and well-nourished. No distress.  HENT:  Head: Normocephalic.  Eyes: No scleral icterus.  Neck: Neck supple.  Cardiovascular: Normal rate.   Respiratory: Effort normal.  GI: Soft.  Genitourinary: No vaginal discharge found.  Genitourinary Comments: Left labia majora: 71mm superficial indurated pustular lesion, mildly tender area. Not fluctuant. Tiny amount yellow drainage with pressure.No blood. No surrounding erythema. No inguinal lymphadenopathy. Inferior to lesion loose sutures present. No other area of inflammation noted.    Musculoskeletal: Normal range of motion.  Neurological: She is alert and oriented to person, place, and time.  Skin: Skin is warm and dry. No rash noted. No erythema.  Psychiatric: She has a normal mood and affect. Her behavior is normal. Thought content normal.    MAU Course  Procedures Results for orders placed or performed during the hospital encounter of 09/08/16 (from the past 24 hour(s))  CBC with Differential/Platelet     Status: None (Preliminary result)   Collection Time: 09/08/16  5:54 PM  Result Value Ref Range   WBC  9.2 4.0 - 10.5 K/uL   RBC 4.56 3.87 - 5.11 MIL/uL   Hemoglobin 13.8 12.0 - 15.0 g/dL   HCT 41.4 36.0 - 46.0 %   MCV 90.8 78.0 - 100.0 fL   MCH 30.3 26.0 - 34.0 pg   MCHC 33.3 30.0 - 36.0 g/dL   RDW 13.9 11.5 - 15.5 %   Platelets 394 150 - 400 K/uL   Neutrophils Relative % 62 %   Neutro Abs 5.6 1.7 - 7.7 K/uL   Lymphocytes Relative 32 %  Lymphs Abs 3.0 0.7 - 4.0 K/uL   Monocytes Relative 4 %   Monocytes Absolute 0.4 0.1 - 1.0 K/uL   Eosinophils Relative 2 %   Eosinophils Absolute 0.2 0.0 - 0.7 K/uL   Basophils Relative 0 %   Basophils Absolute 0.0 0.0 - 0.1 K/uL   Other PENDING %  (CBC was ordered by another provider prior to pt being seen)  C/W Dr. Philis Pique: Discharge home with reassurance and instructions to cleanse the area with warm soapy washes 3 times a day and to spray with peri-bottle each time after voiding  Assessment and Plan   1. Granuloma of subcutaneous tissue    Allergies as of 09/08/2016      Reactions   Iodinated Diagnostic Agents Anaphylaxis   Propofol Anaphylaxis   Shellfish Allergy Anaphylaxis   Ciprofloxacin Swelling   Hives   Iodine    Sulfa Antibiotics Swelling   Migraine   Zithromax [azithromycin] Hives   Latex Rash   Sensitivity on too long      Medication List    STOP taking these medications   HYDROcodone-acetaminophen 5-325 MG tablet Commonly known as:  NORCO/VICODIN   ibuprofen 600 MG tablet Commonly known as:  ADVIL,MOTRIN     TAKE these medications   CALCIUM 500 + D3 250-500 MG-UNIT Chew Generic drug:  Ca Phosphate-Cholecalciferol Chew 1 tablet by mouth daily.   docusate sodium 100 MG capsule Commonly known as:  COLACE Take 1 capsule (100 mg total) by mouth 2 (two) times daily.   estradiol 2 MG tablet Commonly known as:  ESTRACE Take 1 mg by mouth daily.   fexofenadine-pseudoephedrine 60-120 MG 12 hr tablet Commonly known as:  ALLEGRA-D Take 1 tablet by mouth daily as needed (allergies).   latanoprost 0.005 %  ophthalmic solution Commonly known as:  XALATAN Place 1 drop into both eyes daily.   multivitamin with minerals Tabs tablet Take 1 tablet by mouth daily.   progesterone 200 MG capsule Commonly known as:  PROMETRIUM Take 200 mgs once daily for 10 days every other month   VIACTIV MULTI-VITAMIN Chew Chew 1 tablet by mouth daily.   Vitamin D3 1000 units Caps Take 1,000 Units by mouth daily.      Follow-up Information    Vanessa Kick, MD Follow up on 09/12/2016.   Specialty:  Obstetrics and Gynecology Contact information: [address] [id number] [place], [place], CNM 09/08/2016 6:45 PM   ,

## 2016-09-08 NOTE — MAU Note (Signed)
Had a vulvectomy  6/28,  Has had some issues with the stitches.  Says there is a small, hard area, sudden onset of pain, ? Absces.  Doc said to come in for further eval.  Had felt a pop, little drainage- but still present

## 2017-06-20 ENCOUNTER — Other Ambulatory Visit: Payer: Self-pay | Admitting: Obstetrics and Gynecology

## 2017-06-20 DIAGNOSIS — R928 Other abnormal and inconclusive findings on diagnostic imaging of breast: Secondary | ICD-10-CM

## 2017-06-21 ENCOUNTER — Ambulatory Visit
Admission: RE | Admit: 2017-06-21 | Discharge: 2017-06-21 | Disposition: A | Discharge status: Home / Self Care | Payer: 59 | Source: Ambulatory Visit | Attending: Obstetrics and Gynecology | Admitting: Obstetrics and Gynecology

## 2017-06-21 DIAGNOSIS — R928 Other abnormal and inconclusive findings on diagnostic imaging of breast: Secondary | ICD-10-CM

## 2018-01-07 ENCOUNTER — Other Ambulatory Visit (HOSPITAL_COMMUNITY): Payer: Self-pay | Admitting: Obstetrics and Gynecology

## 2018-01-07 ENCOUNTER — Other Ambulatory Visit: Payer: Self-pay | Admitting: Obstetrics and Gynecology

## 2018-01-07 DIAGNOSIS — R1900 Intra-abdominal and pelvic swelling, mass and lump, unspecified site: Secondary | ICD-10-CM

## 2018-01-14 ENCOUNTER — Encounter (HOSPITAL_COMMUNITY): Payer: Self-pay

## 2018-01-14 ENCOUNTER — Ambulatory Visit (HOSPITAL_COMMUNITY)
Admission: RE | Admit: 2018-01-14 | Discharge: 2018-01-14 | Disposition: A | Discharge status: Home / Self Care | Payer: 59 | Source: Ambulatory Visit | Attending: Obstetrics and Gynecology | Admitting: Obstetrics and Gynecology

## 2018-01-14 DIAGNOSIS — R1900 Intra-abdominal and pelvic swelling, mass and lump, unspecified site: Secondary | ICD-10-CM | POA: Diagnosis present

## 2018-01-14 LAB — POCT I-STAT CREATININE: Creatinine, Ser: 0.7 mg/dL (ref 0.44–1.00)

## 2018-01-14 MED ORDER — IOHEXOL 300 MG/ML  SOLN
100.0000 mL | Freq: Once | INTRAMUSCULAR | Status: AC | PRN
  Administered 2018-01-14: 100 mL via INTRAVENOUS

## 2018-01-14 MED ORDER — DIPHENHYDRAMINE HCL 50 MG/ML IJ SOLN
INTRAMUSCULAR | Status: AC
  Filled 2018-01-14: qty 1

## 2018-01-14 MED ORDER — DIPHENHYDRAMINE HCL 50 MG/ML IJ SOLN
50.0000 mg | Freq: Once | INTRAMUSCULAR | Status: AC
  Administered 2018-01-14: 50 mg via INTRAVENOUS
  Filled 2018-01-14: qty 1

## 2018-01-16 ENCOUNTER — Other Ambulatory Visit: Payer: Self-pay | Admitting: General Surgery

## 2018-01-16 NOTE — H&P (Signed)
Amber Klein Location: [facility] Patient #: [id number] DOB: April 04, 1950 Married / Language: English / Race: White Female       History of Present Illness      This is a very pleasant 67 year old female. She is an at large member of the [employer]. She is referred by Vanessa Kick because of relative recent onset of a nonreducible right groin mass. Simona Huh, PA at [facility] provides primary care. Dr. Collene Mares is her gastroenterologist     She has no prior history of hernia. She says was used a child they told her she might have a hernia on the right side but she has never had any symptoms. 10 days ago she awoke with a lump in her right groin. It was a little bit tender. No nausea vomiting alteration in her bowel habits. No voiding problems. She went for a CT scan to check this out of the gave her some prednisone and the pain has resolved. CT scan shows a 6 cm fluid-filled mass in the right groin which is felt to be a right groin hernia. Bowel and bladder are not involved. They did not specifically say whether this was inguinal or femoral. Also she has advanced sigmoid diverticular disease on CT. Comorbidities include GERD, right bundle-branch block, vulvectomy, ORIF left shoulder and arm. Cataracts. C-sections. Endometriosis with laparoscopy in the past. Colonoscopy 2016. Takes estradiol and albuterol inhalers.       Family history did not seem to be contributory Social history reveals that she is married with 2 children. Former smoker. At large member of the [employer].      Exam reveals a 6 cm smooth somewhat mobile mass in the right groin. This seems to more be more inguinal than femoral. No signs of infection or inflammation. I could not reduce this. It was a little bit tender when I would push hard. No evidence of mass or hernia on the left side. I told her this was most likely a fluid-filled hernia that was incarcerated. Much  less likely to be adenopathy, lymphoma, or carcinoma.      This is most likely an incarcerated right inguinal hernia with a narrow neck. I advised her to have surgery sooner rather than later and she agrees. She asked about laparoscopy and I told her that was contraindicated in the setting of incarceration She'll be scheduled for open repair of right inguinal hernia with mesh. I discussed the indications, details, techniques, and numerous risk of the surgery with her. She is aware of the risk of bleeding, infection, recurrence, nerve damage with chronic pain, infrequent injury to bowel or bladder requiring major reconstructive surgery. She understands these issues well. All her questions were answered. She agrees with this plan Surgery will be scheduled urgently      Cataract Surgery  Bilateral. Cesarean Section - Multiple  Colon Polyp Removal - Colonoscopy   Diagnostic Studies History  Colonoscopy  1-5 years ago Mammogram  within last year Pap Smear  1-5 years ago  Allergies Iodine (Antiseptic) *ANTISEPTICS & DISINFECTANTS*  Cipro *FLUOROQUINOLONES*  Sulfa 10 *OPHTHALMIC AGENTS*  Azithromycin *CHEMICALS*  Allergies Reconciled   Medication History  Estradiol (2MG  Tablet, Oral) Active. Progesterone Micronized (200MG  Capsule, Oral) Active. Latanoprost (0.005% Solution, Ophthalmic) Active. Vitamin D (Oral) Specific strength unknown - Active. Multi Vitamin (Oral) Active. Medications Reconciled  Social History  Alcohol use  Occasional alcohol use. Caffeine use  Coffee. No drug use  Tobacco use  Current some day smoker.  Family History  Cancer  Son. Depression  Mother. Heart disease in female family member before age 8  Hypertension  Mother. Ischemic Bowel Disease  Brother.  Pregnancy / Birth History  Age at menarche  22 years. Age of menopause  [age] Contraceptive History  Intrauterine device. Gravida  3 Length (months) of  breastfeeding  3-6 Maternal age  23-25 Para  2  Other Problems Bladder Problems  Diverticulosis  General anesthesia - complications  Inguinal Hernia  Melanoma     Review of Systems  General Not Present- Appetite Loss, Chills, Fatigue, Fever, Night Sweats, Weight Gain and Weight Loss. Skin Not Present- Change in Wart/Mole, Dryness, Hives, Jaundice, New Lesions, Non-Healing Wounds, Rash and Ulcer. HEENT Present- Seasonal Allergies and Wears glasses/contact lenses. Not Present- Earache, Hearing Loss, Hoarseness, Nose Bleed, Oral Ulcers, Ringing in the Ears, Sinus Pain, Sore Throat, Visual Disturbances and Yellow Eyes. Respiratory Not Present- Bloody sputum, Chronic Cough, Difficulty Breathing, Snoring and Wheezing. Breast Not Present- Breast Mass, Breast Pain, Nipple Discharge and Skin Changes. Cardiovascular Not Present- Chest Pain, Difficulty Breathing Lying Down, Leg Cramps, Palpitations, Rapid Heart Rate, Shortness of Breath and Swelling of Extremities. Gastrointestinal Present- Abdominal Pain. Not Present- Bloating, Bloody Stool, Change in Bowel Habits, Chronic diarrhea, Constipation, Difficulty Swallowing, Excessive gas, Gets full quickly at meals, Hemorrhoids, Indigestion, Nausea, Rectal Pain and Vomiting. Female Genitourinary Present- Frequency. Not Present- Nocturia, Painful Urination, Pelvic Pain and Urgency. Musculoskeletal Not Present- Back Pain, Joint Pain, Joint Stiffness, Muscle Pain, Muscle Weakness and Swelling of Extremities. Neurological Not Present- Decreased Memory, Fainting, Headaches, Numbness, Seizures, Tingling, Tremor, Trouble walking and Weakness. Psychiatric Not Present- Anxiety, Bipolar, Change in Sleep Pattern, Depression, Fearful and Frequent crying. Endocrine Not Present- Cold Intolerance, Excessive Hunger, Hair Changes, Heat Intolerance, Hot flashes and New Diabetes. Hematology Not Present- Blood Thinners, Easy Bruising, Excessive bleeding, Gland  problems, HIV and Persistent Infections.  Vitals Weight: 154.8 lb Height: 65in Body Surface Area: 1.77 m Body Mass Index: 25.76 kg/m  Temp.: 58F(Temporal)  Pulse: 128 (Regular)  BP: 122/64 (Sitting, Left Arm, Standard)       Physical Exam  General Mental Status-Alert. General Appearance-Consistent with stated age. Hydration-Well hydrated. Voice-Normal.  Head and Neck Head-normocephalic, atraumatic with no lesions or palpable masses. Trachea-midline. Thyroid Gland Characteristics - normal size and consistency.  Eye Eyeball - Bilateral-Extraocular movements intact. Sclera/Conjunctiva - Bilateral-No scleral icterus.  Chest and Lung Exam Chest and lung exam reveals -quiet, even and easy respiratory effort with no use of accessory muscles and on auscultation, normal breath sounds, no adventitious sounds and normal vocal resonance. Inspection Chest Wall - Normal. Back - normal.  Cardiovascular Cardiovascular examination reveals -normal heart sounds, regular rate and rhythm with no murmurs and normal pedal pulses bilaterally.  Abdomen Inspection Inspection of the abdomen reveals - No Hernias. Skin - Scar - Note: Well-healed Pfannenstiel incision from C-section. Palpation/Percussion Palpation and Percussion of the abdomen reveal - Soft, Non Tender, No Rebound tenderness, No Rigidity (guarding) and No hepatosplenomegaly. Auscultation Auscultation of the abdomen reveals - Bowel sounds normal.  Female Genitourinary Note: Examined supine and standing. Smooth 6 cm somewhat mobile mass in the right groin. Little bit tender when I push hard. Could not reduce this. Skin healthy. No signs of infection. Seems to be more in the inguinal canal and in the femoral space. No evidence of hernia or mass on the left.   Neurologic Neurologic evaluation reveals -alert and oriented x 3 with no impairment of recent or remote memory. Mental  Status-Normal.  Musculoskeletal Normal Exam - Left-Upper Extremity Strength Normal and Lower Extremity Strength Normal. Normal Exam - Right-Upper Extremity Strength Normal and Lower Extremity Strength Normal.  Lymphatic Head & Neck  General Head & Neck Lymphatics: Bilateral - Description - Normal. Axillary  General Axillary Region: Bilateral - Description - Normal. Tenderness - Non Tender. Femoral & Inguinal  Generalized Femoral & Inguinal Lymphatics: Bilateral - Description - Normal. Tenderness - Non Tender.    Impression and Plan: INGUINAL HERNIA OF RIGHT SIDE WITH OBSTRUCTION (K40.30)   You have developed a bulge in your right groin over the past 10 days. This is a little bit tender but not infected I cannot push this back in The CT scan confirms that this is a fluid-filled right groin hernia This does not seem to involve the intestine or bladder  Most likely this is an inguinal hernia but could be a femoral hernia  Because it is incarcerated I advise you to go ahead with surgery now and you agree We cannot do this laparoscopically because it is incarcerated you will be scheduled for open repair of right inguinal hernia with mesh We have discussed the indications, techniques, and risk of this surgery in detail Please read over the information booklet that we reviewed    HISTORY OF C-SECTION (D14.970) ENDOMETRIOSIS (N80.9) CHRONIC GERD (K21.9) FORMER SMOKER (Y63.785)    Edsel Petrin. Dalbert Batman, M.D., [facility] General and Minimally invasive Surgery Breast and Colorectal Surgery Office:   [phone] Pager:   [phone]

## 2018-01-17 ENCOUNTER — Encounter (HOSPITAL_COMMUNITY): Payer: Self-pay | Admitting: Urology

## 2018-01-17 ENCOUNTER — Other Ambulatory Visit: Payer: Self-pay

## 2018-01-17 NOTE — Progress Notes (Signed)
Spoke with pt for pre-op call. Pt denies cardiac history or HTN. Pt has pre-diabetes. Last A1C was 6.9 in August. Pt states her fasting blood sugar is usually between 105-120. Please see Allison's note also.

## 2018-01-17 NOTE — Progress Notes (Signed)
Anesthesia Chart Review: Amber Klein   Case:  [id number] Date/Time:  01/18/18 1215   Procedure:  OPEN REPAIR INCARCERATED RIGHT INGUINAL HERNIA WITH MESH (Right ) - GENERAL / TAP BLOCK   Anesthesia type:  General   Pre-op diagnosis:  RIGHT INGUINAL HERNIA   Location:  [facility] OR ROOM 02 / [facility]   Surgeon:  Fanny Skates, MD      DISCUSSION: Patient is a 67 year old female scheduled for the above procedure.   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   Anesthesia history lists, "'I went into shock.' Decreased body temperature., WILL ONLY HAVE SURGERY WITH SPINAL ANESTHESIA ONLY!!!" PAT RN Ailene Ravel spoke with patient briefly this afternoon (staff to call back after 4:15 PM per patient request) regarding anesthesia history. She reported issues with "propofol" with surgery in 1978 and with a knee surgery at [facility]. Reportedly, she prefers spinal anesthesia, but is aware that eventually she could have to have general anesthesia. She said Dr. Dalbert Batman recommended she discuss the definitive anesthesia plan with her anesthesiologist. (11/07/10 anesthesia records on chart. She received a left knee block and propofol gtt.)   Dr. Darrel Hoover notes indicated that she is an at large member of the [facility].   RN will contact patient after 4:15 PM to update history and review instructions. Reviewed available information with anesthesiologist Tamela Gammon, MD. Anesthesiologist will evaluate on arrival and discuss anesthesia plan. She prefers spinal anesthesia. Case is posted for open (not laparoscopic) repair.    PROVIDERSArmanda Heritage, NP is listed as PCP. Per Dr. Darrel Hoover notes, Simona Huh, [place] is PCP [facility]). GYN is Vanessa Kick, MD. GI is Dr. Collene Mares.   LABS: ISTAT Cr 0.70 on 01/14/18. Additional labs on the day of surgery.   IMAGES: CT abd/pelvis 01/14/18: IMPRESSION: 1. Nearly 6 cm right groin hernia is  filled with fluid. As there is no associated intraperitoneal free fluid, incarcerated right groin hernia is a possibility. The uterus is tethered over towards the hernia and while the ovary appears to be along the right pelvic sidewall, herniation of a portion of the right broad ligament into the sac is possible. No evidence for bowel extension into the right groin hernia. 2. Advanced diverticular disease in the left colon with ill-defined wall thickening of the mid sigmoid segment. Small lymph nodes are associated in the sigmoid mesocolon. Diverticulitis is not excluded. Sigmoid neoplasm is also a consideration. Correlation with colorectal cancer screening history recommended.   EKG: Last EKG seen from 05/06/15 showed SR with frequent PVCs, LAD, right BBB. She had an incomplete right BBB and LAFB on 11/04/10 tracing.   CV: N/A   Past Medical History:  Diagnosis Date  . Arthritis   . Complication of anesthesia    "I went into shock". Decreased body temperature., WILL ONLY HAVE SURGERY WITH SPINAL ANESTHESIA ONLY!!!  . Dysrhythmia   . Ear infection   . Environmental and seasonal allergies   . GERD (gastroesophageal reflux disease)    no longer having issues takes TUMS occ.  . Pneumonia 02/2016   walking pneumonia   . RBBB   . Sinus infection     Past Surgical History:  Procedure Laterality Date  . CATARACT EXTRACTION    . CESAREAN SECTION    . CO2 LASER APPLICATION N/A 06/06/5359   Procedure: CO2 LASER APPLICATION;  Surgeon: Vanessa Kick, MD;  Location: [facility];  Service: Gynecology;  Laterality:  N/A;  . COLONOSCOPY    . FOOT SURGERY    . KNEE SURGERY    . ORIF ELBOW FRACTURE Left 07/04/2014   Procedure: OPEN REDUCTION INTERNAL FIXATION (ORIF) ELBOW/OLECRANON FRACTURE;  Surgeon: Meredith Pel, MD;  Location: [facility];  Service: Orthopedics;  Laterality: Left;  . REFRACTIVE SURGERY    . VULVECTOMY N/A 08/10/2016   Procedure: WIDE EXCISION VULVECTOMY;  Surgeon: Vanessa Kick, MD;  Location: [facility];  Service: Gynecology;  Laterality: N/A;    MEDICATIONS: No current facility-administered medications for this encounter.    . Ca Phosphate-Cholecalciferol (CALCIUM 500 + D3) 250-500 MG-UNIT CHEW  . Cholecalciferol (VITAMIN D3) 1000 units CAPS  . docusate sodium (COLACE) 100 MG capsule  . estradiol (ESTRACE) 2 MG tablet  . fexofenadine-pseudoephedrine (ALLEGRA-D) 60-120 MG 12 hr tablet  . latanoprost (XALATAN) 0.005 % ophthalmic solution  . Multiple Vitamin (MULTIVITAMIN WITH MINERALS) TABS tablet  . Multiple Vitamins-Calcium (VIACTIV MULTI-VITAMIN) CHEW  . progesterone (PROMETRIUM) 200 MG capsule    George Hugh [facility]/Anesthesiology Phone [phone] 01/17/2018 4:05 PM

## 2018-01-17 NOTE — Progress Notes (Signed)
I spoke with patient briefly. 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 I notified patient that her voicemail is full and she states she will try to empty voicemail when she gets the chance. Pt did state that she does not want general anesthesia and she just wants a spinal used for surgery. Pt states in 1978 as well as another surgery with [facility] she went into shock with the use of propofol. Pt states she told this to Dr. Dalbert Batman and he stated that she would have to discuss this with her anesthesia doctor. Pt stated that she understood that if at some point during the surgery if she had to switch from spinal anesthesia she would be okay with this.   I notified Ebony Hail with anesthesia and will place her chart to be called back after 4:15 today.

## 2018-01-17 NOTE — Anesthesia Preprocedure Evaluation (Addendum)
Anesthesia Evaluation  Patient identified by MRN, date of birth, ID band Patient awake    Reviewed: Allergy & Precautions, NPO status , Patient's Chart, lab work & pertinent test results  History of Anesthesia Complications (+) history of anesthetic complications ("I went into shock". Decreased body temperature., WILL ONLY HAVE SURGERY WITH SPINAL ANESTHESIA ONLY!!!)  Airway Mallampati: II  TM Distance: >3 FB Neck ROM: Full    Dental  (+) Teeth Intact, Dental Advisory Given, Caps   Pulmonary former smoker,    Pulmonary exam normal breath sounds clear to auscultation       Cardiovascular Exercise Tolerance: Good (-) hypertension(-) angina(-) CAD Normal cardiovascular exam+ dysrhythmias (RBBB)  Rhythm:Regular Rate:Normal     Neuro/Psych negative neurological ROS  negative psych ROS   GI/Hepatic Neg liver ROS, GERD  ,RIH   Endo/Other  negative endocrine ROS  Renal/GU negative Renal ROS     Musculoskeletal  (+) Arthritis ,   Abdominal   Peds  Hematology negative hematology ROS (+)   Anesthesia Other Findings Day of surgery medications reviewed with the patient.  Reproductive/Obstetrics                           Anesthesia Physical Anesthesia Plan  ASA: II  Anesthesia Plan: Spinal   Post-op Pain Management:  [facility]ional for Post-op pain   Induction:   PONV Risk Score and Plan: 2 and Treatment may vary due to age or medical condition, Midazolam and Ondansetron  Airway Management Planned: Natural Airway and Nasal Cannula  Additional Equipment:   Intra-op Plan:   Post-operative Plan:   Informed Consent: I have reviewed the patients History and Physical, chart, labs and discussed the procedure including the risks, benefits and alternatives for the proposed anesthesia with the patient or authorized representative who has indicated his/her understanding and acceptance.   Dental advisory  given  Plan Discussed with: CRNA, Anesthesiologist and Surgeon  Anesthesia Plan Comments: (See PAT note written 01/17/2018 by Myra Gianotti, PA-C (regarding patient's preference for spinal over general anesthesia).  Spinal, Precedex infusion, TAP block)      Anesthesia Quick Evaluation

## 2018-01-18 ENCOUNTER — Ambulatory Visit (HOSPITAL_COMMUNITY): Payer: 59 | Admitting: Vascular Surgery

## 2018-01-18 ENCOUNTER — Encounter (HOSPITAL_COMMUNITY)
Admission: RE | Disposition: A | Discharge status: Home / Self Care | Payer: Self-pay | Source: Home / Self Care | Attending: General Surgery

## 2018-01-18 ENCOUNTER — Ambulatory Visit (HOSPITAL_COMMUNITY)
Admission: RE | Admit: 2018-01-18 | Discharge: 2018-01-18 | Disposition: A | Discharge status: Home / Self Care | Payer: 59 | Attending: General Surgery | Admitting: General Surgery

## 2018-01-18 ENCOUNTER — Encounter (HOSPITAL_COMMUNITY): Payer: Self-pay | Admitting: *Deleted

## 2018-01-18 ENCOUNTER — Other Ambulatory Visit: Payer: Self-pay

## 2018-01-18 DIAGNOSIS — Z79890 Hormone replacement therapy (postmenopausal): Secondary | ICD-10-CM | POA: Diagnosis not present

## 2018-01-18 DIAGNOSIS — Z79899 Other long term (current) drug therapy: Secondary | ICD-10-CM | POA: Diagnosis not present

## 2018-01-18 DIAGNOSIS — Z87891 Personal history of nicotine dependence: Secondary | ICD-10-CM | POA: Insufficient documentation

## 2018-01-18 DIAGNOSIS — K4030 Unilateral inguinal hernia, with obstruction, without gangrene, not specified as recurrent: Secondary | ICD-10-CM | POA: Diagnosis present

## 2018-01-18 DIAGNOSIS — K4130 Unilateral femoral hernia, with obstruction, without gangrene, not specified as recurrent: Secondary | ICD-10-CM | POA: Diagnosis present

## 2018-01-18 DIAGNOSIS — Z85820 Personal history of malignant melanoma of skin: Secondary | ICD-10-CM | POA: Diagnosis not present

## 2018-01-18 HISTORY — DX: Hypotension, unspecified: I95.9

## 2018-01-18 HISTORY — DX: Personal history of urinary calculi: Z87.442

## 2018-01-18 HISTORY — PX: INGUINAL HERNIA REPAIR: SHX194

## 2018-01-18 HISTORY — DX: Unilateral femoral hernia, with obstruction, without gangrene, not specified as recurrent: K41.30

## 2018-01-18 HISTORY — DX: Malignant (primary) neoplasm, unspecified: C80.1

## 2018-01-18 HISTORY — DX: Prediabetes: R73.03

## 2018-01-18 LAB — COMPREHENSIVE METABOLIC PANEL
ALT: 14 U/L (ref 0–44)
AST: 15 U/L (ref 15–41)
Albumin: 3.6 g/dL (ref 3.5–5.0)
Alkaline Phosphatase: 66 U/L (ref 38–126)
Anion gap: 12 (ref 5–15)
BUN: 10 mg/dL (ref 8–23)
CO2: 22 mmol/L (ref 22–32)
Calcium: 9.3 mg/dL (ref 8.9–10.3)
Chloride: 100 mmol/L (ref 98–111)
Creatinine, Ser: 0.77 mg/dL (ref 0.44–1.00)
GFR calc Af Amer: >60 mL/min (ref >60)
GFR calc non Af Amer: >60 mL/min (ref >60)
Glucose, Bld: 99 mg/dL (ref 70–99)
Potassium: 4 mmol/L (ref 3.5–5.1)
Sodium: 134 mmol/L — ABNORMAL LOW (ref 135–145)
Total Bilirubin: 0.5 mg/dL (ref 0.3–1.2)
Total Protein: 6.6 g/dL (ref 6.5–8.1)

## 2018-01-18 LAB — CBC WITH DIFFERENTIAL/PLATELET
Abs Immature Granulocytes: 0.03 10*3/uL (ref 0.00–0.07)
Basophils Absolute: 0.1 10*3/uL (ref 0.0–0.1)
Basophils Relative: 1 %
Eosinophils Absolute: 0.2 10*3/uL (ref 0.0–0.5)
Eosinophils Relative: 2 %
HCT: 45.8 % (ref 36.0–46.0)
Hemoglobin: 13.8 g/dL (ref 12.0–15.0)
Immature Granulocytes: 0 %
Lymphocytes Relative: 23 %
Lymphs Abs: 2.4 10*3/uL (ref 0.7–4.0)
MCH: 28.2 pg (ref 26.0–34.0)
MCHC: 30.1 g/dL (ref 30.0–36.0)
MCV: 93.5 fL (ref 80.0–100.0)
Monocytes Absolute: 0.9 10*3/uL (ref 0.1–1.0)
Monocytes Relative: 9 %
Neutro Abs: 7 10*3/uL (ref 1.7–7.7)
Neutrophils Relative %: 65 %
Platelets: 386 10*3/uL (ref 150–400)
RBC: 4.9 MIL/uL (ref 3.87–5.11)
RDW: 13.5 % (ref 11.5–15.5)
WBC: 10.6 10*3/uL — ABNORMAL HIGH (ref 4.0–10.5)
nRBC: 0 % (ref 0.0–0.2)

## 2018-01-18 LAB — GLUCOSE, CAPILLARY: Glucose-Capillary: 92 mg/dL (ref 70–99)

## 2018-01-18 SURGERY — REPAIR, HERNIA, INGUINAL, ADULT
Anesthesia: Spinal | Site: Groin | Laterality: Right

## 2018-01-18 MED ORDER — CELECOXIB 200 MG PO CAPS
200.0000 mg | ORAL_CAPSULE | ORAL | Status: AC
  Administered 2018-01-18: 200 mg via ORAL
  Filled 2018-01-18: qty 1

## 2018-01-18 MED ORDER — DEXMEDETOMIDINE HCL IN NACL 200 MCG/50ML IV SOLN
INTRAVENOUS | Status: AC
  Filled 2018-01-18: qty 50

## 2018-01-18 MED ORDER — FENTANYL CITRATE (PF) 250 MCG/5ML IJ SOLN
INTRAMUSCULAR | Status: DC | PRN
  Administered 2018-01-18 (×2): 50 ug via INTRAVENOUS

## 2018-01-18 MED ORDER — MIDAZOLAM HCL 5 MG/5ML IJ SOLN
INTRAMUSCULAR | Status: DC | PRN
  Administered 2018-01-18 (×2): 1 mg via INTRAVENOUS

## 2018-01-18 MED ORDER — FENTANYL CITRATE (PF) 100 MCG/2ML IJ SOLN
50.0000 ug | Freq: Once | INTRAMUSCULAR | Status: AC
  Administered 2018-01-18: 50 ug via INTRAVENOUS

## 2018-01-18 MED ORDER — ACETAMINOPHEN 325 MG PO TABS: 650.0000 mg | ORAL_TABLET | ORAL | Status: DC | PRN

## 2018-01-18 MED ORDER — EPHEDRINE SULFATE 50 MG/ML IJ SOLN
INTRAMUSCULAR | Status: DC | PRN
  Administered 2018-01-18 (×3): 5 mg via INTRAVENOUS
  Administered 2018-01-18: 10 mg via INTRAVENOUS

## 2018-01-18 MED ORDER — GABAPENTIN 300 MG PO CAPS
300.0000 mg | ORAL_CAPSULE | ORAL | Status: AC
  Administered 2018-01-18: 300 mg via ORAL
  Filled 2018-01-18: qty 1

## 2018-01-18 MED ORDER — OXYCODONE HCL 5 MG PO TABS: 5.0000 mg | ORAL_TABLET | ORAL | Status: DC | PRN

## 2018-01-18 MED ORDER — SODIUM CHLORIDE 0.9 % IV SOLN: 250.0000 mL | INTRAVENOUS | Status: DC | PRN

## 2018-01-18 MED ORDER — DEXMEDETOMIDINE HCL IN NACL 200 MCG/50ML IV SOLN
INTRAVENOUS | Status: DC | PRN
  Administered 2018-01-18: 0.7 ug/kg/h via INTRAVENOUS

## 2018-01-18 MED ORDER — MIDAZOLAM HCL 2 MG/2ML IJ SOLN
INTRAMUSCULAR | Status: AC
  Filled 2018-01-18: qty 2

## 2018-01-18 MED ORDER — DEXMEDETOMIDINE HCL 200 MCG/2ML IV SOLN
INTRAVENOUS | Status: DC | PRN
  Administered 2018-01-18: 8 ug via INTRAVENOUS

## 2018-01-18 MED ORDER — 0.9 % SODIUM CHLORIDE (POUR BTL) OPTIME
TOPICAL | Status: DC | PRN
  Administered 2018-01-18: 1000 mL

## 2018-01-18 MED ORDER — CHLORHEXIDINE GLUCONATE CLOTH 2 % EX PADS: 6.0000 | MEDICATED_PAD | Freq: Once | CUTANEOUS | Status: DC

## 2018-01-18 MED ORDER — DIPHENHYDRAMINE HCL 50 MG/ML IJ SOLN
INTRAMUSCULAR | Status: DC | PRN
  Administered 2018-01-18: 12.5 mg via INTRAVENOUS

## 2018-01-18 MED ORDER — FENTANYL CITRATE (PF) 250 MCG/5ML IJ SOLN
INTRAMUSCULAR | Status: AC
  Filled 2018-01-18: qty 5

## 2018-01-18 MED ORDER — LACTATED RINGERS IV SOLN
Freq: Once | INTRAVENOUS | Status: AC
  Administered 2018-01-18: 12:00:00 via INTRAVENOUS

## 2018-01-18 MED ORDER — CEFAZOLIN SODIUM-DEXTROSE 2-4 GM/100ML-% IV SOLN
2.0000 g | INTRAVENOUS | Status: AC
  Administered 2018-01-18: 2 g via INTRAVENOUS
  Filled 2018-01-18: qty 100

## 2018-01-18 MED ORDER — MIDAZOLAM HCL 2 MG/2ML IJ SOLN
INTRAMUSCULAR | Status: AC
  Administered 2018-01-18: 2 mg via INTRAVENOUS
  Filled 2018-01-18: qty 2

## 2018-01-18 MED ORDER — LACTATED RINGERS IV SOLN
INTRAVENOUS | Status: DC | PRN
  Administered 2018-01-18: 12:00:00 via INTRAVENOUS

## 2018-01-18 MED ORDER — BUPIVACAINE-EPINEPHRINE (PF) 0.25% -1:200000 IJ SOLN
INTRAMUSCULAR | Status: AC
  Filled 2018-01-18: qty 30

## 2018-01-18 MED ORDER — ACETAMINOPHEN 650 MG RE SUPP: 650.0000 mg | RECTAL | Status: DC | PRN

## 2018-01-18 MED ORDER — TRAMADOL HCL 50 MG PO TABS: 50.0000 mg | ORAL_TABLET | Freq: Four times a day (QID) | ORAL | 1 refills | Status: DC | PRN

## 2018-01-18 MED ORDER — FENTANYL CITRATE (PF) 100 MCG/2ML IJ SOLN: 25.0000 ug | INTRAMUSCULAR | Status: DC | PRN

## 2018-01-18 MED ORDER — FENTANYL CITRATE (PF) 100 MCG/2ML IJ SOLN
INTRAMUSCULAR | Status: AC
  Administered 2018-01-18: 50 ug via INTRAVENOUS
  Filled 2018-01-18: qty 2

## 2018-01-18 MED ORDER — BUPIVACAINE-EPINEPHRINE 0.5% -1:200000 IJ SOLN
INTRAMUSCULAR | Status: DC | PRN
  Administered 2018-01-18: 8 mL

## 2018-01-18 MED ORDER — SODIUM CHLORIDE 0.9% FLUSH: 3.0000 mL | Freq: Two times a day (BID) | INTRAVENOUS | Status: DC

## 2018-01-18 MED ORDER — ACETAMINOPHEN 500 MG PO TABS: 1000.0000 mg | ORAL_TABLET | Freq: Four times a day (QID) | ORAL | Status: DC

## 2018-01-18 MED ORDER — MIDAZOLAM HCL 2 MG/2ML IJ SOLN
2.0000 mg | Freq: Once | INTRAMUSCULAR | Status: AC
  Administered 2018-01-18: 2 mg via INTRAVENOUS

## 2018-01-18 MED ORDER — ONDANSETRON HCL 4 MG/2ML IJ SOLN: 4.0000 mg | Freq: Once | INTRAMUSCULAR | Status: DC | PRN

## 2018-01-18 MED ORDER — BUPIVACAINE-EPINEPHRINE (PF) 0.5% -1:200000 IJ SOLN
INTRAMUSCULAR | Status: DC | PRN
  Administered 2018-01-18: 30 mL via PERINEURAL

## 2018-01-18 MED ORDER — SODIUM CHLORIDE 0.9% FLUSH: 3.0000 mL | INTRAVENOUS | Status: DC | PRN

## 2018-01-18 MED ORDER — LACTATED RINGERS IV SOLN: INTRAVENOUS | Status: DC

## 2018-01-18 MED ORDER — ACETAMINOPHEN 500 MG PO TABS
1000.0000 mg | ORAL_TABLET | ORAL | Status: AC
  Administered 2018-01-18: 1000 mg via ORAL
  Filled 2018-01-18: qty 2

## 2018-01-18 MED ORDER — ONDANSETRON HCL 4 MG/2ML IJ SOLN
INTRAMUSCULAR | Status: DC | PRN
  Administered 2018-01-18: 4 mg via INTRAVENOUS

## 2018-01-18 SURGICAL SUPPLY — 47 items
BLADE CLIPPER SURG (BLADE) ×3 IMPLANT
BLADE SURG 10 STRL SS (BLADE) ×3 IMPLANT
BLADE SURG 15 STRL LF DISP TIS (BLADE) ×1 IMPLANT
BLADE SURG 15 STRL SS (BLADE) ×3
CANISTER SUCT 3000ML PPV (MISCELLANEOUS) ×3 IMPLANT
CHLORAPREP W/TINT 26ML (MISCELLANEOUS) ×3 IMPLANT
COVER SURGICAL LIGHT HANDLE (MISCELLANEOUS) ×3 IMPLANT
COVER WAND RF STERILE (DRAPES) IMPLANT
DERMABOND ADVANCED (GAUZE/BANDAGES/DRESSINGS) ×2
DERMABOND ADVANCED .7 DNX12 (GAUZE/BANDAGES/DRESSINGS) ×1 IMPLANT
DRAPE LAPAROTOMY TRNSV 102X78 (DRAPE) ×3 IMPLANT
DRAPE UTILITY XL STRL (DRAPES) ×3 IMPLANT
ELECT CAUTERY BLADE 6.4 (BLADE) ×3 IMPLANT
ELECT REM PT RETURN 9FT ADLT (ELECTROSURGICAL) ×3
ELECTRODE REM PT RTRN 9FT ADLT (ELECTROSURGICAL) ×1 IMPLANT
GLOVE EUDERMIC 7 POWDERFREE (GLOVE) ×3 IMPLANT
GOWN STRL REUS W/ TWL LRG LVL3 (GOWN DISPOSABLE) ×1 IMPLANT
GOWN STRL REUS W/ TWL XL LVL3 (GOWN DISPOSABLE) ×1 IMPLANT
GOWN STRL REUS W/TWL LRG LVL3 (GOWN DISPOSABLE) ×3
GOWN STRL REUS W/TWL XL LVL3 (GOWN DISPOSABLE) ×3
KIT BASIN OR (CUSTOM PROCEDURE TRAY) ×3 IMPLANT
KIT TURNOVER KIT B (KITS) ×3 IMPLANT
MANIFOLD NEPTUNE II (INSTRUMENTS) ×3 IMPLANT
MESH ULTRAPRO 3X6 7.6X15CM (Mesh General) ×3 IMPLANT
NEEDLE HYPO 25GX1X1/2 BEV (NEEDLE) ×3 IMPLANT
NS IRRIG 1000ML POUR BTL (IV SOLUTION) ×3 IMPLANT
PACK SURGICAL SETUP 50X90 (CUSTOM PROCEDURE TRAY) ×3 IMPLANT
PAD ARMBOARD 7.5X6 YLW CONV (MISCELLANEOUS) ×3 IMPLANT
SPONGE LAP 18X18 RF (DISPOSABLE) ×6 IMPLANT
SPONGE LAP 4X18 RFD (DISPOSABLE) ×3 IMPLANT
SUT ETHIBOND 0 MO6 C/R (SUTURE) ×3 IMPLANT
SUT MNCRL AB 4-0 PS2 18 (SUTURE) ×6 IMPLANT
SUT PROLENE 2 0 CT2 30 (SUTURE) ×15 IMPLANT
SUT SILK 2 0 (SUTURE) ×3
SUT SILK 2 0 SH (SUTURE) ×3 IMPLANT
SUT SILK 2-0 18XBRD TIE 12 (SUTURE) ×1 IMPLANT
SUT VIC AB 2-0 BRD 54 (SUTURE) ×3 IMPLANT
SUT VIC AB 2-0 CT1 27 (SUTURE) ×3
SUT VIC AB 2-0 CT1 TAPERPNT 27 (SUTURE) ×1 IMPLANT
SUT VIC AB 3-0 SH 27 (SUTURE) ×3
SUT VIC AB 3-0 SH 27XBRD (SUTURE) ×1 IMPLANT
SYR BULB 3OZ (MISCELLANEOUS) ×3 IMPLANT
SYR CONTROL 10ML LL (SYRINGE) ×3 IMPLANT
TOWEL GREEN STERILE (TOWEL DISPOSABLE) ×3 IMPLANT
TUBE CONNECTING 12'X1/4 (SUCTIONS) ×1
TUBE CONNECTING 12X1/4 (SUCTIONS) ×2 IMPLANT
YANKAUER SUCT BULB TIP NO VENT (SUCTIONS) ×3 IMPLANT

## 2018-01-18 NOTE — Interval H&P Note (Signed)
History and Physical Interval Note:  01/18/2018 10:41 AM  Amber Klein  has presented today for surgery, with the diagnosis of RIGHT INGUINAL HERNIA  The various methods of treatment have been discussed with the patient and family. After consideration of risks, benefits and other options for treatment, the patient has consented to  Procedure(s) with comments: OPEN REPAIR INCARCERATED RIGHT INGUINAL HERNIA WITH MESH (Right) - GENERAL / TAP BLOCK as a surgical intervention .  The patient's history has been reviewed, patient examined, no change in status, stable for surgery.  I have reviewed the patient's chart and labs.  Questions were answered to the patient's satisfaction.     Adin Hector

## 2018-01-18 NOTE — Anesthesia Procedure Notes (Signed)
Spinal  Patient location during procedure: OR Start time: 01/18/2018 1:05 PM End time: 01/18/2018 1:08 PM Staffing Anesthesiologist: Catalina Gravel, MD Performed: anesthesiologist  Preanesthetic Checklist Completed: patient identified, surgical consent, pre-op evaluation, timeout performed, IV checked, risks and benefits discussed and monitors and equipment checked Spinal Block Patient position: sitting Prep: site prepped and draped and DuraPrep Patient monitoring: continuous pulse ox and blood pressure Approach: midline Location: L3-4 Injection technique: single-shot Needle Needle type: Pencan  Needle gauge: 24 G Additional Notes Functioning IV was confirmed and monitors were applied. Sterile prep and drape, including hand hygiene, mask and sterile gloves were used. The patient was positioned and the spine was prepped. The skin was anesthetized with lidocaine.  Free flow of clear CSF was obtained prior to injecting local anesthetic into the CSF.  The spinal needle aspirated freely following injection.  The needle was carefully withdrawn.  The patient tolerated the procedure well. Consent was obtained prior to procedure with all questions answered and concerns addressed. Risks including but not limited to bleeding, infection, nerve damage, paralysis, failed block, inadequate analgesia, allergic reaction, high spinal, itching and headache were discussed and the patient wished to proceed.   Hoy Morn, MD

## 2018-01-18 NOTE — Discharge Instructions (Signed)
[facility] _______[facility]  FEMORAL  HERNIA REPAIR: POST OP INSTRUCTIONS  Always review your discharge instruction sheet given to you by the facility where your surgery was performed. IF YOU HAVE DISABILITY OR FAMILY LEAVE FORMS, YOU MUST BRING THEM TO THE OFFICE FOR PROCESSING.   DO NOT GIVE THEM TO YOUR DOCTOR.  1. A  prescription for pain medication may be given to you upon discharge.  Take your pain medication as prescribed, if needed.  If narcotic pain medicine is not needed, then you may take acetaminophen (Tylenol) or ibuprofen (Advil) as needed. 2. Take your usually prescribed medications unless otherwise directed. If you need a refill on your pain medication, please contact your pharmacy.  They will contact our office to request authorization. Prescriptions will not be filled after 5 pm or on week-ends. 3. You should follow a light diet the first 24 hours after arrival home, such as soup and crackers, etc.  Be sure to include lots of fluids daily.  Resume your normal diet the day after surgery. 4.Most patients will experience some swelling and bruising  in the groin .  Ice packs and reclining will help.  Swelling and bruising can take several days to resolve.  6. It is common to experience some constipation if taking pain medication after surgery.  Increasing fluid intake and taking a stool softener (such as Colace) will usually help or prevent this problem from occurring.  A mild laxative (Milk of Magnesia or Miralax) should be taken according to package directions if there are no bowel movements after 48 hours. 7. Unless discharge instructions indicate otherwise, you may remove your bandages 24-48 hours after surgery, and you may shower at that time.  You may have steri-strips (small skin tapes) in place directly over the incision.  These strips should be left on the skin for 7-10 days.  If your surgeon used skin glue on the incision, you may shower in 24 hours.  The glue will  flake off over the next 2-3 weeks.  Any sutures or staples will be removed at the office during your follow-up visit. 8. ACTIVITIES:  You may resume regular (light) daily activities beginning the next day--such as daily self-care, walking, climbing stairs--gradually increasing activities as tolerated.  You may have sexual intercourse when it is comfortable.  Refrain from any heavy lifting or straining until approved by your doctor.  a.You may drive when you are no longer taking prescription pain medication, you can comfortably wear a seatbelt, and you can safely maneuver your car and apply brakes. b.RETURN TO WORK:   _____________________________________________  9.You should see your doctor in the office for a follow-up appointment approximately 2-3 weeks after your surgery.  Make sure that you call for this appointment within a day or two after you arrive home to insure a convenient appointment time. 10.OTHER INSTRUCTIONS: __Do not work for 10 days.    No sports or lifting more than 15 pounds for 5 weeks _______________________    _____________________________________  WHEN TO CALL YOUR DOCTOR: 1. Fever over 101.0 2. Inability to urinate 3. Nausea and/or vomiting 4. Extreme swelling or bruising 5. Continued bleeding from incision. 6. Increased pain, redness, or drainage from the incision  The clinic staff is available to answer your questions during regular business hours.  Please dont hesitate to call and ask to speak to one of the nurses for clinical concerns.  If you have a medical emergency, go to the nearest emergency room or call 911.  A surgeon from [facility] is always on call at the hospital   [address] [place], [place], [place]  [address] ?  [address], [place], [place]   [address] [phone] ? [phone] ? FAX [phone] Web site: [email or web address]

## 2018-01-18 NOTE — Op Note (Signed)
Patient Name:           Amber Klein   Date of Surgery:        01/18/2018  Pre op Diagnosis:      Incarcerated right inguinal hernia  Post op Diagnosis:    Incarcerated right femoral hernia  Procedure:                 Open repair incarcerated right femoral hernia with Mcvay repair with mesh  Surgeon:                     Edsel Petrin. Dalbert Batman, M.D., FACS  Assistant:                      OR staff  Operative Indications:   This is a very pleasant 67 year old female who is brought to the operating room for repair of incarcerated right groin hernia. She is referred by Vanessa Kick because of relative recent onset of a nonreducible right groin mass. Simona Huh, PA at [facility] provides primary care. Dr. Collene Mares is her gastroenterologist     She has no prior history of hernia. She says was used a child they told her she might have a hernia on the right side but she has never had any symptoms. 10 days ago she awoke with a lump in her right groin. It was a little bit tender. No nausea vomiting alteration in her bowel habits. No voiding problems. She went for a CT scan to check this out of the gave her some prednisone and the pain has resolved. CT scan shows a 6 cm fluid-filled mass in the right groin which is felt to be a right groin hernia. Bowel and bladder are not involved. They did not specifically say whether this was inguinal or femoral. Also she has advanced sigmoid diverticular disease on CT. Comorbidities include GERD, right bundle-branch block, vulvectomy, ORIF left shoulder and arm. Cataracts. C-sections. Endometriosis with laparoscopy in the past. Colonoscopy 2016. Takes estradiol and albuterol inhalers.      Exam reveals a 6 cm smooth somewhat mobile mass in the right groin.  No signs of infection or inflammation. I could not reduce this. It was a little bit tender when I would push hard. No evidence of mass or hernia on the left side. I told her this was most likely a  fluid-filled hernia that was incarcerated. Much less likely to be adenopathy, lymphoma, or carcinoma.      This is most likely an incarcerated right inguinal hernia with a narrow neck. I advised her to have surgery sooner rather than later and she agrees. She asked about laparoscopy and I told her that was contraindicated in the setting of incarceration She'll be scheduled for open repair of right inguinal hernia with mesh. I discussed the indications, details, techniques, and numerous risk of the surgery with her. She is aware of the risk of bleeding, infection, recurrence, nerve damage with chronic pain, infrequent injury to bowel or bladder requiring major reconstructive surgery. She understands these issues well. All her questions were answered. She agrees with this plan   Operative Findings:       There was an incarcerated right femoral hernia.  There was no inflammation or signs of infection or perforation.  To get this reduced I had to make an incision in the inguinal ligament anteriorly and then it reduced easily.  A 3 x 6 inch piece of ultra pro mesh was used to repair  this as a [employer].  Procedure in Detail:          The patient underwent spinal anesthesia.  That was effective.  The lower abdomen and genitalia were prepped and draped in a sterile fashion.  Surgical timeout was performed.  Intravenous antibiotics were given.  0.25% Marcaine with epinephrine was used to supplement the spinal anesthetic      A transverse incision was made in the right groin overlying the inguinal canal.  Dissection was carried down through subcutaneous tissue and I encountered the hernia sac which was not reducible.  I dissected around this.  I exposed the external oblique.  I  incised it in the direction of its fibers opening up and exposing the internal oblique the round ligament was resected.  She did not seem to have an inguinal hernia.  I dissected around the neck of the femoral hernia but ultimately  had to incise the inguinal ligament anterior to the femoral hernia and then I was able to easily reduce it.  A 3 inch x 6 inch piece of ultra pro mesh was brought to the operative field.  I used 4 interrupted mattress sutures of 0 Ethibond to place the sutures down through the mesh, through Cooper's ligament and then back up through the mesh.  Very carefully held the femoral vein laterally and then tied all of the sutures.  I then made a transition stitch up to the inguinal ligament with 2-0 Prolene.  Laterally I sutured the mesh to the lateral portion of the inguinal ligament.  Medially, superiorly, and superior laterally I placed interrupted mattress sutures of 2-0 Prolene to fix the mesh in place.  This provided very secure repair and coverage. .  I examined the femoral area and the did not seem to be any residual defect.  There was no bleeding.  The wound was irrigated.  The external oblique was closed with a running suture of 2-0 Vicryl, although the medial portion could not be closed.  Scarpa's fascia was closed with 3-0 Vicryl sutures and the skin closed with a running subcuticular 4-0 Monocryl and Dermabond.  The patient tolerated the procedure well was taken to PACU in stable condition.  EBL 20 cc or less.  Counts correct.  Complications none.    Addendum: I logged onto the [employer] and reviewed her prescription medication history      M. Dalbert Batman, M.D., FACS General and Minimally Invasive Surgery Breast and Colorectal Surgery  01/18/2018 2:25 PM

## 2018-01-18 NOTE — Anesthesia Procedure Notes (Signed)
Anesthesia Regional Block: TAP block   Pre-Anesthetic Checklist: ,, timeout performed, Correct Patient, Correct Site, Correct Laterality, Correct Procedure, Correct Position, site marked, Risks and benefits discussed,  Surgical consent,  Pre-op evaluation,  At surgeon's request and post-op pain management  Laterality: Right  Prep: chloraprep       Needles:  Injection technique: Single-shot  Needle Type: Echogenic Stimulator Needle     Needle Length: 9cm  Needle Gauge: 21     Additional Needles:   Procedures:,,,, ultrasound used (permanent image in chart),,,,  Narrative:  Start time: 01/18/2018 12:10 PM End time: 01/18/2018 12:15 PM Injection made incrementally with aspirations every 5 mL.  Performed by: Personally  Anesthesiologist: Lyn Hollingshead, MD  Additional Notes: No pain on injection. No increased resistance to injection. Injection made in 5cc increments.  Good needle visualization.  Patient tolerated procedure well.

## 2018-01-18 NOTE — Anesthesia Postprocedure Evaluation (Signed)
Anesthesia Post Note  Patient: Amber Klein  Procedure(s) Performed: OPEN REPAIR INCARCERATED RIGHT FEMORAL HERNIA WITH MESH (Right Groin)     Patient location during evaluation: PACU Anesthesia Type: Spinal Level of consciousness: oriented, awake and alert and awake Pain management: pain level controlled Vital Signs Assessment: post-procedure vital signs reviewed and stable Respiratory status: spontaneous breathing, respiratory function stable and nonlabored ventilation Cardiovascular status: blood pressure returned to baseline and stable Postop Assessment: no headache, no backache, no apparent nausea or vomiting, spinal receding and patient able to bend at knees Anesthetic complications: no    Last Vitals:  Vitals:   01/18/18 1445 01/18/18 1447  BP:  98/69  Pulse: 76 66  Resp: 13 17  Temp:    SpO2: 91% 96%    Last Pain:  Vitals:   01/18/18 1445  TempSrc:   PainSc: 0-No pain                 Catalina Gravel

## 2018-01-18 NOTE — Progress Notes (Signed)
Patient ambulated up and down PACU hallway. Pt confident in ability to safely get inside. Movement and sensation now present in BLE. Will discharge home with husband at this time.

## 2018-01-18 NOTE — Transfer of Care (Signed)
Immediate Anesthesia Transfer of Care Note  Patient: Amber Klein  Procedure(s) Performed: OPEN REPAIR INCARCERATED RIGHT FEMORAL HERNIA WITH MESH (Right Groin)  Patient Location: PACU  Anesthesia Type:Regional and Spinal  Level of Consciousness: awake, alert  and oriented  Airway & Oxygen Therapy: Patient Spontanous Breathing and Patient connected to nasal cannula oxygen  Post-op Assessment: Report given to RN and Post -op Vital signs reviewed and stable  Post vital signs: Reviewed and stable  Last Vitals:  Vitals Value Taken Time  BP 126/77 01/18/2018  2:18 PM  Temp    Pulse 69 01/18/2018  2:18 PM  Resp 8 01/18/2018  2:18 PM  SpO2 95 % 01/18/2018  2:18 PM  Vitals shown include unvalidated device data.  Last Pain:  Vitals:   01/18/18 1230  TempSrc:   PainSc: 0-No pain      Patients Stated Pain Goal: 3 (69/45/03 8882)  Complications: No apparent anesthesia complications

## 2018-01-18 NOTE — Progress Notes (Signed)
Patient ambulated to bathroom and voided. Sensation still decreased to LLE, but purposeful movement in BLE. Patient slightly unsteady on her feet and will re-attempt to ambulate before discharge.

## 2018-01-21 ENCOUNTER — Encounter (HOSPITAL_COMMUNITY): Payer: Self-pay | Admitting: General Surgery

## 2018-08-13 LAB — HM PAP SMEAR

## 2018-12-30 IMAGING — CT CT ABD-PELV W/ CM
2 of 5 series · 15 of 46 positions shown, 17 images · IV contrast (Omni 300)
Comparison: None.

CLINICAL DATA: Abdominal and pelvic swelling. Large lump lower
right pelvis that just popped up this past week.

EXAM:
CT ABDOMEN AND PELVIS WITH CONTRAST
TECHNIQUE: Multidetector CT imaging of the abdomen and pelvis was performed
using the standard protocol following bolus administration of
intravenous contrast.
CONTRAST:  100mL OMNIPAQUE IOHEXOL 300 MG/ML  SOLN

[Series 3: a/p w/ 5mm · axial · 0.72mm/px · z∈[+918,+1258]mm · 12 of 76 slices shown, 14 images]
[im 4/76  soft-tissue]
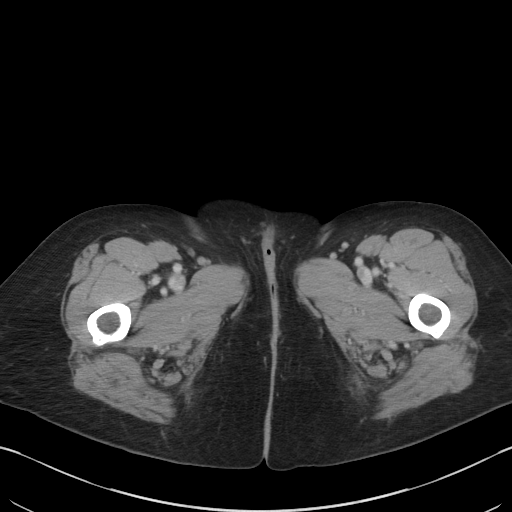
[im 4/76  bone]
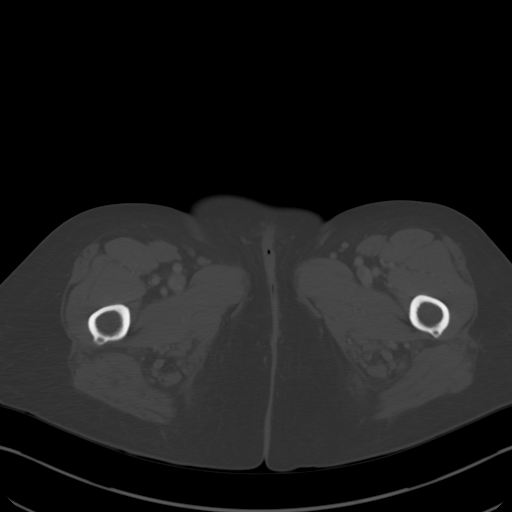
[im 12/76  soft-tissue]
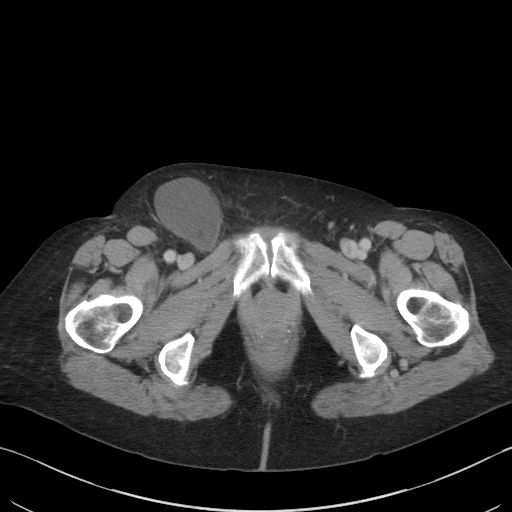
[im 16/76  soft-tissue]
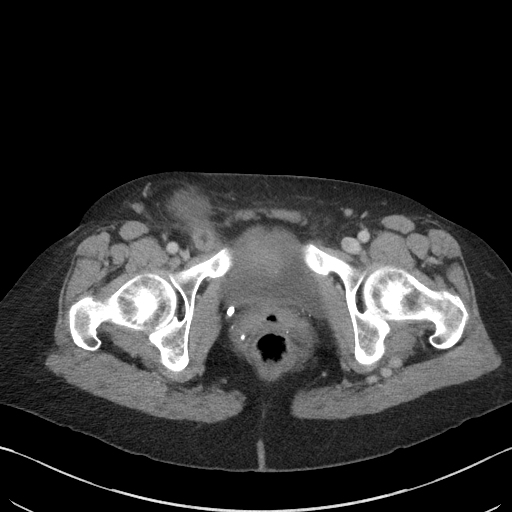
[im 23/76  soft-tissue]
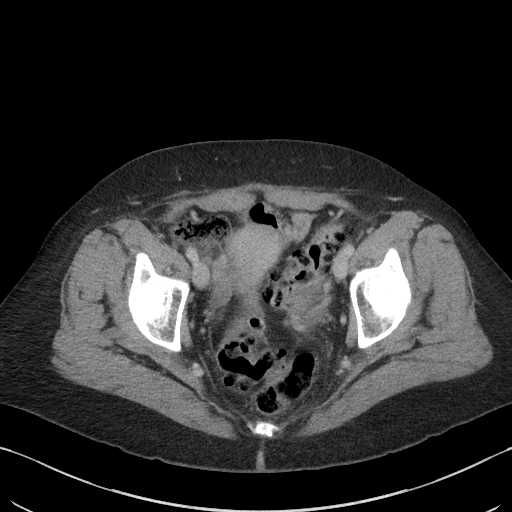
[im 31/76  soft-tissue]
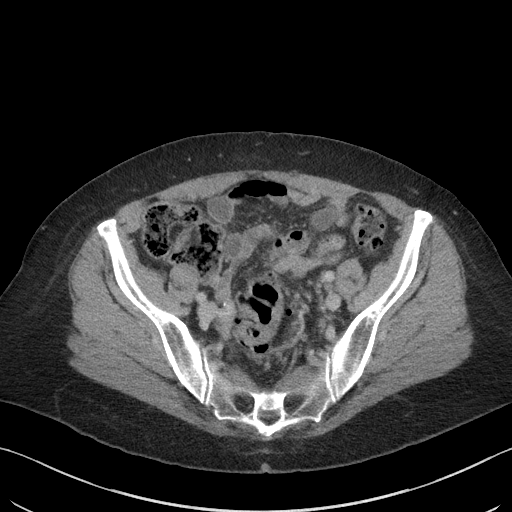
[im 34/76  soft-tissue]
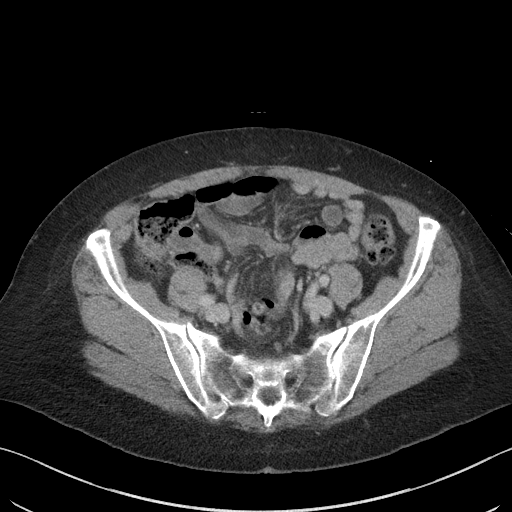
[im 42/76  soft-tissue]
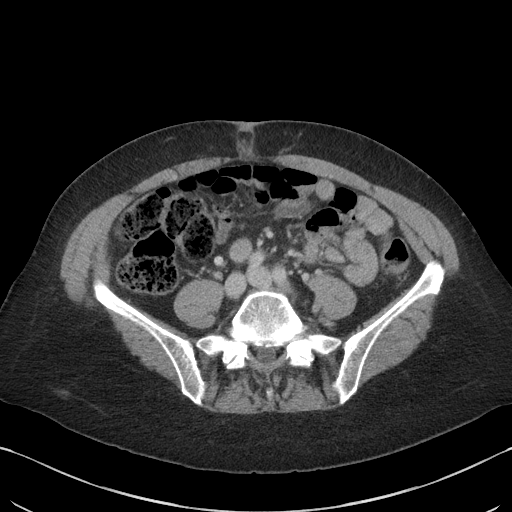
[im 46/76  soft-tissue]
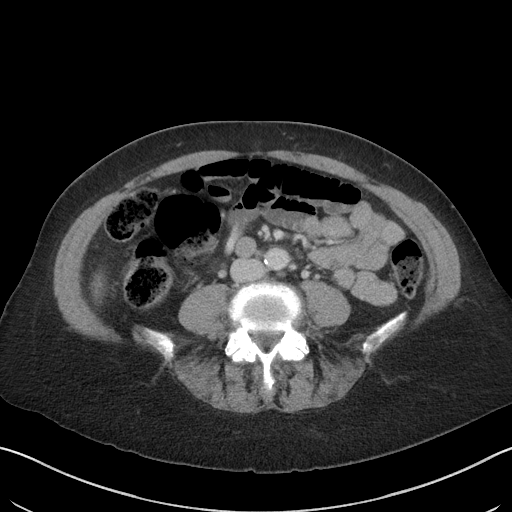
[im 53/76  soft-tissue]
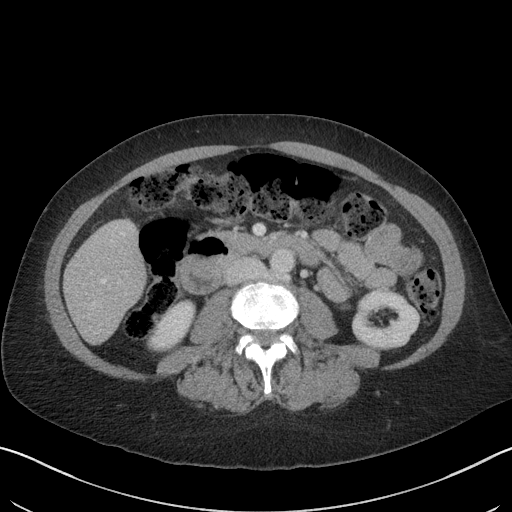
[im 53/76  bone]
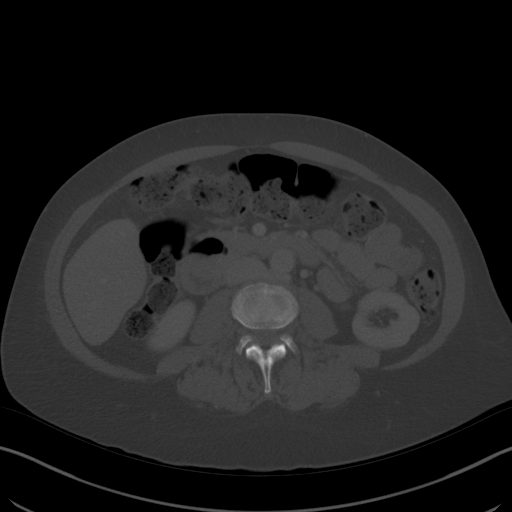
[im 61/76  soft-tissue]
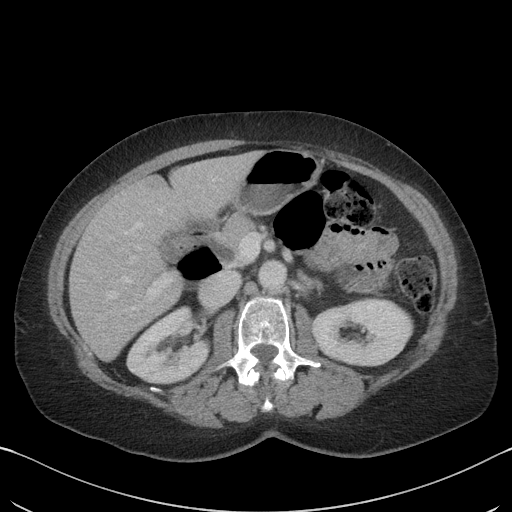
[im 64/76  soft-tissue]
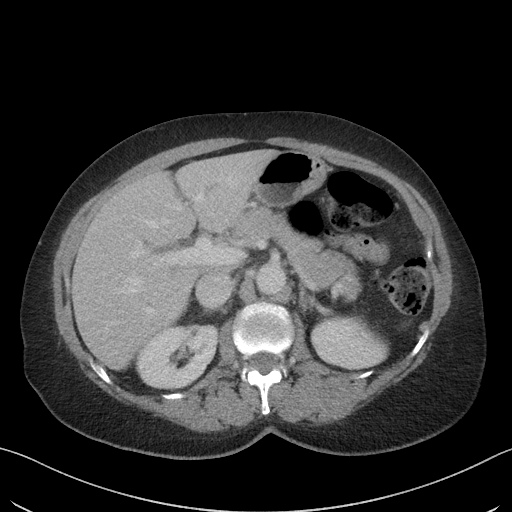
[im 72/76  soft-tissue]
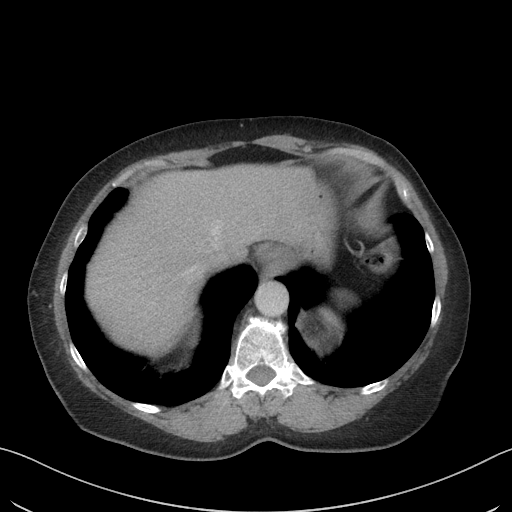

[Series 6: a/p w/ cor · coronal · 0.62mm/px · 3 of 148 slices shown]
[im 50/148  soft-tissue]
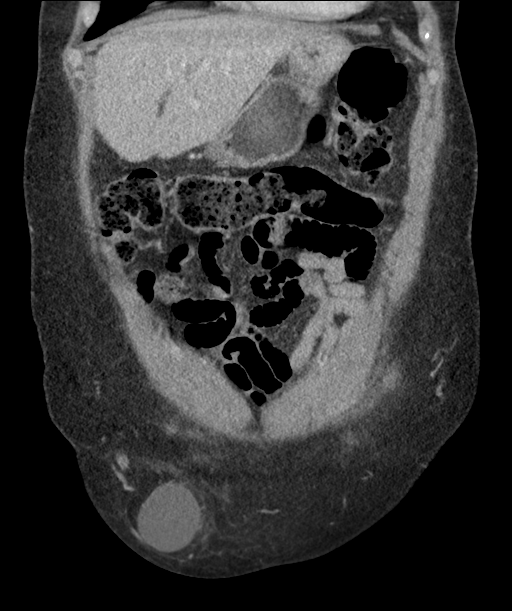
[im 66/148  soft-tissue]
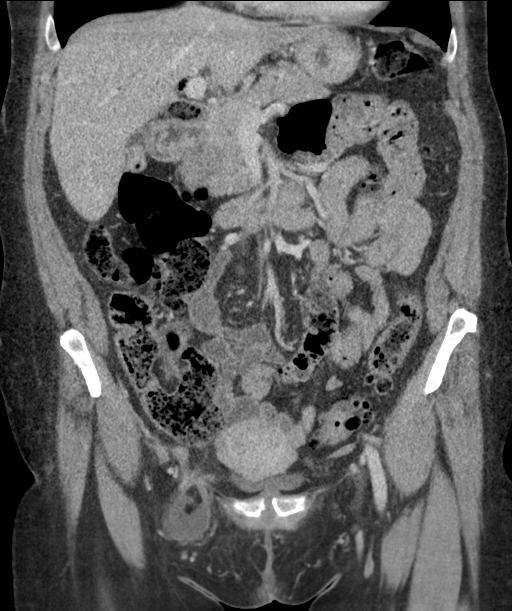
[im 82/148  soft-tissue]
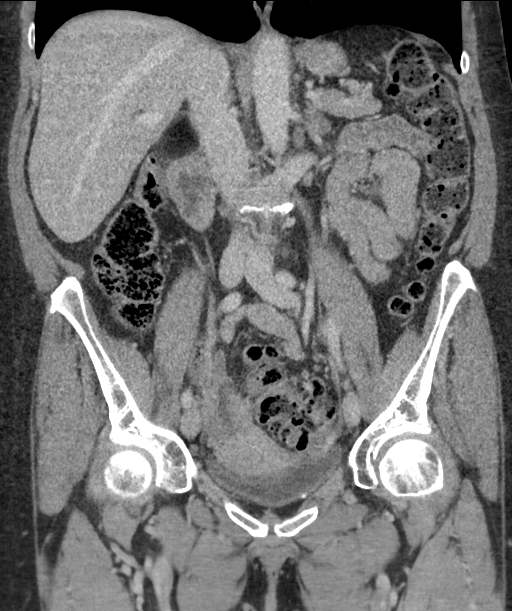

[15 of 46 positions shown; findings below may reference images not displayed]

FINDINGS: Lower chest: Unremarkable.

Hepatobiliary: No focal abnormality within the liver parenchyma.
Mild intrahepatic biliary duct prominence is associated with
pneumobilia. Gallbladder surgically absent.

Pancreas: No focal mass lesion. No dilatation of the main duct. No
intraparenchymal cyst. No peripancreatic edema.

Spleen: No splenomegaly. No focal mass lesion.

Adrenals/Urinary Tract: No adrenal nodule or mass. Kidneys
unremarkable. No evidence for hydroureter. The urinary bladder
appears normal for the degree of distention.

Stomach/Bowel: Stomach is nondistended. No gastric wall thickening.
No evidence of outlet obstruction. Duodenum is normally positioned
as is the ligament of Treitz. Large duodenal diverticulum evident.
No small bowel wall thickening. No small bowel dilatation. The
terminal ileum is normal. The appendix is not visualized, but there
is no edema or inflammation in the region of the cecum. Diverticular
changes are noted in the left colon. There is wall thickening and
ill definition in the wall of the mid sigmoid colon. Confluent soft
tissue attenuation lateral to the sigmoid colon ([DATE] represent
fluid or ovary. Small lymph nodes are evident in the sigmoid
mesocolon.

Vascular/Lymphatic: There is abdominal aortic atherosclerosis
without aneurysm. There is no gastrohepatic or hepatoduodenal
ligament lymphadenopathy. No intraperitoneal or retroperitoneal
lymphadenopathy. No pelvic sidewall lymphadenopathy. As above, small
lymph nodes are evident in the sigmoid mesocolon (47/3).

Reproductive: Fibroid noted anterior uterus. There is no adnexal
mass.

Other: None.

Musculoskeletal: A right groin hernia is evident. Hernia sac
measures 4.3 x 5.6 x 4.6 cm and is fluid-filled. Herniation of some
fat is identified at the level of the fascial defect in the uterus
is tethered over towards the hernia. The right ovary appears to be
just medial to the external iliac vessels suggesting that it is not
contained within the hernia sac although a component of the broad
ligament may bulge at into the hernia.
IMPRESSION: 1. Nearly 6 cm right groin hernia is filled with fluid. As there is
no associated intraperitoneal free fluid, incarcerated right groin
hernia is a possibility. The uterus is tethered over towards the
hernia and while the ovary appears to be along the right pelvic
sidewall, herniation of a portion of the right broad ligament into
the sac is possible. No evidence for bowel extension into the right
groin hernia.
2. Advanced diverticular disease in the left colon with ill-defined
wall thickening of the mid sigmoid segment. Small lymph nodes are
associated in the sigmoid mesocolon. Diverticulitis is not excluded.
Sigmoid neoplasm is also a consideration. Correlation with
colorectal cancer screening history recommended.

## 2019-02-04 ENCOUNTER — Ambulatory Visit: Payer: 59 | Attending: Internal Medicine

## 2019-02-04 ENCOUNTER — Other Ambulatory Visit: Payer: 59

## 2019-02-04 DIAGNOSIS — Z20822 Contact with and (suspected) exposure to covid-19: Secondary | ICD-10-CM

## 2019-02-06 LAB — NOVEL CORONAVIRUS, NAA: SARS-CoV-2, NAA: NOT DETECTED

## 2019-02-20 ENCOUNTER — Ambulatory Visit: Payer: 59 | Attending: Internal Medicine

## 2019-02-20 DIAGNOSIS — Z20822 Contact with and (suspected) exposure to covid-19: Secondary | ICD-10-CM

## 2019-02-21 ENCOUNTER — Other Ambulatory Visit: Payer: 59

## 2019-02-22 LAB — SPECIMEN STATUS REPORT

## 2019-02-22 LAB — NOVEL CORONAVIRUS, NAA: SARS-CoV-2, NAA: NOT DETECTED

## 2019-03-06 ENCOUNTER — Ambulatory Visit: Payer: 59 | Attending: Internal Medicine

## 2019-03-06 DIAGNOSIS — Z20822 Contact with and (suspected) exposure to covid-19: Secondary | ICD-10-CM

## 2019-03-07 LAB — NOVEL CORONAVIRUS, NAA: SARS-CoV-2, NAA: NOT DETECTED

## 2019-03-11 ENCOUNTER — Ambulatory Visit: Payer: 59

## 2019-03-20 ENCOUNTER — Ambulatory Visit: Payer: 59 | Attending: Internal Medicine

## 2019-03-20 DIAGNOSIS — Z23 Encounter for immunization: Secondary | ICD-10-CM | POA: Insufficient documentation

## 2019-03-20 NOTE — Progress Notes (Signed)
   Covid-19 Vaccination Clinic  Name:  Amber Klein    MRN: [id number] DOB: 09-11-1950  03/20/2019  [name] was observed post Covid-19 immunization for 30 minutes based on pre-vaccination screening without incidence. She was provided with Vaccine Information Sheet and instruction to access the V-Safe system.   [name] was instructed to call 911 with any severe reactions post vaccine: Marland Kitchen Difficulty breathing  . Swelling of your face and throat  . A fast heartbeat  . A bad rash all over your body  . Dizziness and weakness    Immunizations Administered    Name Date Dose VIS Date Route   Pfizer COVID-19 Vaccine 03/20/2019 11:19 AM 0.3 mL 01/24/2019 Intramuscular   Manufacturer: [place]   Lot: [id number]   [place]: [id number]

## 2019-03-22 ENCOUNTER — Ambulatory Visit: Payer: 59

## 2019-04-14 ENCOUNTER — Ambulatory Visit: Payer: 59 | Attending: Internal Medicine

## 2019-04-14 DIAGNOSIS — Z23 Encounter for immunization: Secondary | ICD-10-CM | POA: Insufficient documentation

## 2019-04-14 NOTE — Progress Notes (Signed)
   Covid-19 Vaccination Clinic  Name:  Amber Klein    MRN: [id number] DOB: December 03, 1950  04/14/2019  [name] was observed post Covid-19 immunization for 30 minutes based on pre-vaccination screening without incidence. She was provided with Vaccine Information Sheet and instruction to access the V-Safe system.   [name] was instructed to call 911 with any severe reactions post vaccine: Marland Kitchen Difficulty breathing  . Swelling of your face and throat  . A fast heartbeat  . A bad rash all over your body  . Dizziness and weakness    Immunizations Administered    Name Date Dose VIS Date Route   Pfizer COVID-19 Vaccine 04/14/2019  3:12 PM 0.3 mL 01/24/2019 Intramuscular   Manufacturer: [place]   Lot: [id number]   [place]: [id number]

## 2019-09-17 LAB — HM MAMMOGRAPHY

## 2019-10-29 ENCOUNTER — Other Ambulatory Visit: Payer: Self-pay | Admitting: Critical Care Medicine

## 2019-10-29 ENCOUNTER — Other Ambulatory Visit: Payer: 59

## 2019-10-29 DIAGNOSIS — Z20822 Contact with and (suspected) exposure to covid-19: Secondary | ICD-10-CM

## 2019-10-31 LAB — NOVEL CORONAVIRUS, NAA: SARS-CoV-2, NAA: NOT DETECTED

## 2019-10-31 LAB — SARS-COV-2, NAA 2 DAY TAT

## 2019-12-13 ENCOUNTER — Ambulatory Visit: Payer: 59 | Attending: Internal Medicine

## 2019-12-13 DIAGNOSIS — Z23 Encounter for immunization: Secondary | ICD-10-CM

## 2019-12-13 NOTE — Progress Notes (Signed)
   Covid-19 Vaccination Clinic  Name:  LAHOMA CONSTANTIN    MRN: [id number] DOB: 26-Jul-1950  12/13/2019  Ms. Valli was observed post Covid-19 immunization for 30 minutes based on pre-vaccination screening without incident. She was provided with Vaccine Information Sheet and instruction to access the V-Safe system.   Ms. Letson was instructed to call 911 with any severe reactions post vaccine: Marland Kitchen Difficulty breathing  . Swelling of face and throat  . A fast heartbeat  . A bad rash all over body  . Dizziness and weakness

## 2020-04-06 DIAGNOSIS — K5909 Other constipation: Secondary | ICD-10-CM | POA: Insufficient documentation

## 2020-04-10 DIAGNOSIS — K631 Perforation of intestine (nontraumatic): Secondary | ICD-10-CM | POA: Insufficient documentation

## 2020-04-10 HISTORY — PX: COLON SURGERY: SHX602

## 2020-04-11 DIAGNOSIS — K650 Generalized (acute) peritonitis: Secondary | ICD-10-CM | POA: Insufficient documentation

## 2020-04-30 ENCOUNTER — Encounter: Payer: Self-pay | Admitting: Occupational Therapy

## 2020-04-30 NOTE — PMR Pre-admission (Shared)
PMR Admission Coordinator Pre-Admission Assessment  Patient: Amber Klein is an 70 y.o., female MRN: [id number] DOB: January 30, 1951 Height: 0' (0 m) (***) Weight: 0 kg (***)  Insurance Information HMO:     PPO:      PCP:      IPA:      80/20:      OTHER:  PRIMARY: U[employer] Commercial      Policy#: [id number]      Subscriber: patient CM Name: ***      Phone#: ***     Fax#: *** Pre-Cert#: [id number]      Employer:  Benefits:  Phone #: online     Name: [email or web address] Eff. Date: 02/14/2020-02/12/2021     Deduct: $500 ($500 met)      Out of Pocket Max: $2,500 ($1,045.15 met)      Life Max: NA CIR: 80% coverage, 20% co-insurance      SNF: 100% coverage, limited to 90 days per calendar year combined with Habilitative Inpatient Services Outpatient: $25 copay,, limited to 60 visits per calendar year combined      Home Health: 75% coverage, 25% co-insurance after deductible met; limited to 100 visits per calendar year. 1 visit equals up to 4 hours of skilled care services DME: 80% coverage, 20% co-insurance after deductible met       SECONDARY: None      Policy#:       Phone#:   Development worker, community:       Phone#:   The Engineer, petroleum" for patients in Inpatient Rehabilitation Facilities with attached "Privacy Act [place] Records" was provided and verbally reviewed with: N/A  Emergency Contact Information Contact Information    Name Relation Home Work Mobile   Pro,Isa Spouse Randalia Son [phone]        Current Medical History  Patient Admitting Diagnosis: Debility after prolonged hosptial stay for severe diverticulitis and perforation  History of Present Illness: Pt is a 70 yo Female with history of diverticulosis, asthma, and femoral hernia who presented to the [place] ED on 04/05/20 for abdominal pain and nausea and vomiting. She had been having pain for 3 weeks and had had a course of Augmentin and Flagyl  with some improvement.  She was assessed for diverticulitis and a CT showed mild mural thickening of the distal sigmoid colon with numerous diverticuli and mild perienteric stranding; there was also a concern for pneumobilia. Surgery was consulted at [facility] but due to concern for pseudoobstruction from constipation and suggested she be transferred out to [facility] for GI consult on 04/06/20. On 04/10/20, pt required an emergent exploratory laparotomy with washout, sigmoid colectomy and end colostomy [place] procedure) due to a perforated sigmoid with generalized peritonitis. Post surgery, pt required use of pressors, sedation, and intubation. Pt was extubated 04/15/20. Pt is to continue with antibiotics therapy. Pt has requested IP Rehab services at [facility]. Pt is to admit to CIR on ***.     Patient's medical record from [facility] has been reviewed by the rehabilitation admission coordinator and physician.  Past Medical History  Past Medical History:  Diagnosis Date  . Arthritis   . Cancer ([place])    basal cell skin cancer  . Complication of anesthesia    "I went into shock". Decreased body temperature., WILL ONLY HAVE SURGERY WITH SPINAL ANESTHESIA ONLY!!!  . Dysrhythmia   . Ear infection   . Environmental and seasonal allergies   . Femoral hernia of right  side with obstruction 01/18/2018  . GERD (gastroesophageal reflux disease)    no longer having issues takes TUMS occ.  Marland Kitchen History of kidney stones   . Hypotension   . Pneumonia 02/2016   walking pneumonia   . Pre-diabetes   . RBBB   . Sinus infection     Family History   family history is not on file.  Prior Rehab/Hospitalizations Has the patient had prior rehab or hospitalizations prior to admission? No  Has the patient had major surgery during 100 days prior to admission? Yes   Current Medications  Current Outpatient Medications:  .  Ca Phosphate-Cholecalciferol (CALCIUM 500 + D3) 250-500 MG-UNIT CHEW, Chew 1  tablet by mouth daily., Disp: , Rfl:  .  Cholecalciferol (VITAMIN D3) 1000 units CAPS, Take 1,000 Units by mouth daily., Disp: , Rfl:  .  estradiol (ESTRACE) 2 MG tablet, Take 1 mg by mouth daily., Disp: , Rfl:  .  fexofenadine-pseudoephedrine (ALLEGRA-D) 60-120 MG 12 hr tablet, Take 1 tablet by mouth daily as needed (allergies)., Disp: , Rfl:  .  latanoprost (XALATAN) 0.005 % ophthalmic solution, Place 1 drop into both eyes daily. , Disp: , Rfl:  .  Multiple Vitamin (MULTIVITAMIN WITH MINERALS) TABS tablet, Take 1 tablet by mouth daily. , Disp: , Rfl:  .  Multiple Vitamins-Calcium (VIACTIV MULTI-VITAMIN) CHEW, Chew 1 tablet by mouth daily., Disp: , Rfl:  .  progesterone (PROMETRIUM) 200 MG capsule, Take 200 mgs once daily for 10 days every other month, Disp: , Rfl:  .  Red Yeast Rice Extract (RED YEAST RICE PO), Take 1 capsule by mouth daily. , Disp: , Rfl:  .  traMADol (ULTRAM) 50 MG tablet, Take 1-2 tablets (50-100 mg total) by mouth every 6 (six) hours as needed., Disp: 25 tablet, Rfl: 1  Patients Current Diet: Diet : sodium restricted  Precautions / Restrictions Precautions: Fall Precautions/Special Needs: -- (abdominal wound, new colostomy)    Has the patient had 2 or more falls or a fall with injury in the past year? 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 Independent  Indoor Mobility: Did the patient need assistance with walking from room to room (with or without device)? Independent  Stairs: Did the patient need assistance with internal or external stairs (with or without device)? Independent  Functional Cognition: Did the patient need help planning regular tasks such as shopping or remembering to take medications? Independent  Home Assistive Devices / Equipment None   Prior  Device Use: Indicate devices/aids used by the patient prior to current illness, exacerbation or injury? None of the above   Prior Functional Level Current Functional Level  Bed Mobility  Independent  Other; per AM-PAC: turning in bed without bedrails: 3 - A little   Transfers  Independent  Other (Per AM-PAC short forms tool: transfers rated at None)   Mobility - Walk/Wheelchair  Independent  Other (CGA - 150 feet)   Upper Body Dressing  Independent  Set up (hosptial gown)   Lower Body Dressing  Independent  Supervision   Grooming  Independent  Other (Supervision)   Eating/Drinking  Independent  Independent    Toilet Transfer  Independent  Other (Supervision)   Bladder Continence   continent  continent   Bowel Management  continent  new colostomy (per pt, needs assist)  Stair Estate agent (Per AM-PAC Short Forms Tool climb 3-5 steps with railing: 3-A Little)   Communication  WNL  WNL based on clinical assessment from [facility]   Memory  WNL A&O x4     Special Needs/ Care Considerations Continuous Drip IV  ***, Skin : per RN, no pressure injuries charted on acute. just sutures to abdomen;  and Bowel management new colostomy  Previous Home Environment (from acute therapy documentation) Living Arrangements: Spouse/significant other  Lives With: Spouse Available Help at Discharge: Family Type of Home: House Home Layout: Two level; Able to live on main level with bedroom/bathroom Alternate Level Stairs-Rails: None (NA) Alternate Level Stairs-Number of Steps: NA Home Access: Stairs to enter Entrance Stairs-Rails: None Entrance Stairs-Number of Steps: 3 Bathroom Shower/Tub: Multimedia programmer: Standard Bathroom Accessibility: Yes How Accessible: Accessible via walker Home Care Services: No   Discharge Living Setting Plans for Discharge Living Setting: House; Lives with (comment) (lives with husband) Type of Home at Discharge:  House Discharge Home Layout: Two level; Able to live on main level with bedroom/bathroom Alternate Level Stairs-Rails: None (NA) Alternate Level Stairs-Number of Steps: 3 Discharge Home Access: Stairs to enter Entrance Stairs-Rails: None Entrance Stairs-Number of Steps: 3 Discharge Bathroom Shower/Tub: Walk-in shower Discharge Bathroom Toilet: Standard Discharge Bathroom Accessibility: Yes How Accessible: Accessible via walker Does the patient have any problems obtaining your medications?: No   Social/Family/Support Systems Patient Roles: Spouse; Other (Comment) (employed by the city (elected official)) Contact Information: husband: Judye Bos [phone] Anticipated Caregiver: husband Anticipated Caregiver's Contact Information: see above Ability/Limitations of Caregiver: supervision Caregiver Availability: 24/7 Discharge Plan Discussed with Primary Caregiver: Yes (per patient) Is Caregiver In Agreement with Plan?: Yes (per patient) Does Caregiver/Family have Issues with Lodging/Transportation while Pt is in Rehab?: No   Goals Patient/Family Goal for Rehab: PT/OT: Mod I; SLP: NA Expected length of stay: 5-7 days Cultural Considerations: NA Pt/Family Agrees to Admission and willing to participate: Yes Program Orientation Provided & Reviewed with Pt/Caregiver Including Roles  & Responsibilities: Yes  Barriers to Discharge: Home environment access/layout; IV antibiotics; Wound Care; Other (comments)  Barriers to Discharge Comments: steps to enter house; large abdominal wound   Decrease burden of Care through IP rehab admission: NA  Possible need for SNF placement upon discharge: Not anticipated. Per pt she has good social support from her husband who can be present with her at DC as needed.   Patient Condition: I have reviewed medical records from [facility], spoken with CM and Charge RN, and patient. I discussed via phone for inpatient rehabilitation assessment.   Patient will benefit from ongoing PT and OT, can actively participate in 3 hours of therapy a day 5 days of the week, and can make measurable gains during the admission.  Patient will also benefit from the coordinated team approach during an Inpatient Acute Rehabilitation admission.  The patient will receive intensive therapy as well as Rehabilitation physician, nursing, social worker, and care management interventions.  Due to bowel management, safety, skin/wound care, disease management, medication administration, pain management and patient education the patient requires 24 hour a day rehabilitation nursing.  The patient is currently CGA with mobility and Supervision for basic ADLs.  Discharge setting and therapy post discharge at home with home health is anticipated.  Patient has agreed to participate in the Acute Inpatient Rehabilitation Program and will admit ***.  Preadmission Screen Completed By:  Raechel Ache, 04/30/2020 3:03 PM ______________________________________________________________________   Discussed status with Dr. Marland Kitchen  on *** at *** and received approval for admission today.  Admission Coordinator:  Raechel Ache, OT, time ***Sudie Grumbling ***   Assessment/Plan: Diagnosis: 1. Does the need for close, 24 hr/day Medical supervision in concert with the patient's rehab needs make it unreasonable for this patient to be served in a less intensive setting? {yes_no_potentially:3041433} 2. Co-Morbidities requiring supervision/potential complications: *** 3. Due to {due YM:4158309}, does the patient require 24 hr/day rehab nursing? {yes_no_potentially:3041433} 4. Does the patient require coordinated care of a physician, rehab nurse, PT, OT, and SLP to address physical and functional deficits in the context of the above medical diagnosis(es)? {yes_no_potentially:3041433} Addressing deficits in the following areas: {deficits:3041436} 5. Can the patient actively participate in an intensive therapy  program of at least 3 hrs of therapy 5 days a week? {yes_no_potentially:3041433} 6. The potential for patient to make measurable gains while on inpatient rehab is {potential:3041437} 7. Anticipated functional outcomes upon discharge from inpatient rehab: {functional outcomes:304600100} PT, {functional outcomes:304600100} OT, {functional outcomes:304600100} SLP 8. Estimated rehab length of stay to reach the above functional goals is: *** 9. Anticipated discharge destination: {anticipated dc setting:21604} 10. Overall Rehab/Functional Prognosis: {potential:3041437}  MD Signature ***

## 2020-05-05 ENCOUNTER — Telehealth: Payer: Self-pay | Admitting: General Practice

## 2020-05-05 NOTE — Telephone Encounter (Signed)
Ok to schedule.

## 2020-05-05 NOTE — Telephone Encounter (Signed)
This patient is the spouse of Franz Dell MRN [id number], he is a current patient of Dr. Jerilee Hoh.   She is being discharged today from [facility] in [place], [place] and needs a PCP to follow up with.  Can I schedule this patient to establish care?

## 2020-05-05 NOTE — Telephone Encounter (Signed)
Patient is scheduled. Thank you.

## 2020-05-09 ENCOUNTER — Encounter (HOSPITAL_BASED_OUTPATIENT_CLINIC_OR_DEPARTMENT_OTHER): Payer: Self-pay

## 2020-05-09 ENCOUNTER — Other Ambulatory Visit: Payer: Self-pay

## 2020-05-09 ENCOUNTER — Emergency Department (HOSPITAL_BASED_OUTPATIENT_CLINIC_OR_DEPARTMENT_OTHER)
Admission: EM | Admit: 2020-05-09 | Discharge: 2020-05-09 | Disposition: A | Discharge status: Home / Self Care | Payer: 59 | Attending: Emergency Medicine | Admitting: Emergency Medicine

## 2020-05-09 DIAGNOSIS — Z85828 Personal history of other malignant neoplasm of skin: Secondary | ICD-10-CM | POA: Insufficient documentation

## 2020-05-09 DIAGNOSIS — L089 Local infection of the skin and subcutaneous tissue, unspecified: Secondary | ICD-10-CM

## 2020-05-09 DIAGNOSIS — R509 Fever, unspecified: Secondary | ICD-10-CM | POA: Insufficient documentation

## 2020-05-09 DIAGNOSIS — K9189 Other postprocedural complications and disorders of digestive system: Secondary | ICD-10-CM | POA: Diagnosis present

## 2020-05-09 DIAGNOSIS — Z91040 Latex allergy status: Secondary | ICD-10-CM | POA: Insufficient documentation

## 2020-05-09 DIAGNOSIS — Z87891 Personal history of nicotine dependence: Secondary | ICD-10-CM | POA: Diagnosis not present

## 2020-05-09 DIAGNOSIS — T8149XA Infection following a procedure, other surgical site, initial encounter: Secondary | ICD-10-CM | POA: Diagnosis not present

## 2020-05-09 LAB — CBC WITH DIFFERENTIAL/PLATELET
Abs Immature Granulocytes: 0.02 10*3/uL (ref 0.00–0.07)
Basophils Absolute: 0 10*3/uL (ref 0.0–0.1)
Basophils Relative: 0 %
Eosinophils Absolute: 0.2 10*3/uL (ref 0.0–0.5)
Eosinophils Relative: 3 %
HCT: 32.4 % — ABNORMAL LOW (ref 36.0–46.0)
Hemoglobin: 10 g/dL — ABNORMAL LOW (ref 12.0–15.0)
Immature Granulocytes: 0 %
Lymphocytes Relative: 25 %
Lymphs Abs: 2 10*3/uL (ref 0.7–4.0)
MCH: 29.2 pg (ref 26.0–34.0)
MCHC: 30.9 g/dL (ref 30.0–36.0)
MCV: 94.7 fL (ref 80.0–100.0)
Monocytes Absolute: 0.7 10*3/uL (ref 0.1–1.0)
Monocytes Relative: 9 %
Neutro Abs: 4.8 10*3/uL (ref 1.7–7.7)
Neutrophils Relative %: 63 %
Platelets: 545 10*3/uL — ABNORMAL HIGH (ref 150–400)
RBC: 3.42 MIL/uL — ABNORMAL LOW (ref 3.87–5.11)
RDW: 16.6 % — ABNORMAL HIGH (ref 11.5–15.5)
WBC: 7.8 10*3/uL (ref 4.0–10.5)
nRBC: 0 % (ref 0.0–0.2)

## 2020-05-09 LAB — BASIC METABOLIC PANEL
Anion gap: 7 (ref 5–15)
BUN: 10 mg/dL (ref 8–23)
CO2: 30 mmol/L (ref 22–32)
Calcium: 9 mg/dL (ref 8.9–10.3)
Chloride: 100 mmol/L (ref 98–111)
Creatinine, Ser: 0.6 mg/dL (ref 0.44–1.00)
GFR, Estimated: >60 mL/min (ref >60)
Glucose, Bld: 92 mg/dL (ref 70–99)
Potassium: 3.5 mmol/L (ref 3.5–5.1)
Sodium: 137 mmol/L (ref 135–145)

## 2020-05-09 MED ORDER — AMOXICILLIN-POT CLAVULANATE 875-125 MG PO TABS: 1.0000 | ORAL_TABLET | Freq: Two times a day (BID) | ORAL | 0 refills | Status: DC

## 2020-05-09 NOTE — ED Notes (Addendum)
Dressing supplies given to patient to better fit abdominal section. Patient assisted to get dressed. Husband at bedside. Mild assistance for dressing and ambulation.

## 2020-05-09 NOTE — ED Triage Notes (Signed)
Pt report was in [place] hospital in Feb for diverticula rupture and septic shock. Pt now has colostomy bag and having leaking around the bag and concerned it has moved into her incision site. Pt just d/c'd from hospital Wed 05/05/20

## 2020-05-09 NOTE — ED Notes (Signed)
ED Provider at bedside. 

## 2020-05-09 NOTE — ED Notes (Signed)
Patient reports waking up this am noting stool leaking from colostomy bag, placed in [place] last month during 33 day hospital stay. On exam abdominal wound healing well---lowest point of wound has dehiscence with wet to dry dressing. Wound care nurse is coming 1x a week at this time. Colostomy bag noted to have large chunk of stool at the base of the internal ostomy--none in bag. Denies any specific abdominal pain.

## 2020-05-09 NOTE — ED Notes (Signed)
Myself and Charlett Nose EMT completed wound care and changed ostomy bag. Sterile water and mild soap used to clean mid abdominal section--open wound cleaned and wet to dry dressing placed. Patient is requesting provider to look at small bump that has pus drainage (top of healed scar).    Ostomy removed and large stool burden removed. Area cleaned. Ostomy paste placed. Cut at 1 1/2cm per patients request. After placement of ostomy bag stool flowing freely.

## 2020-05-09 NOTE — ED Provider Notes (Signed)
[facility] EMERGENCY DEPARTMENT Provider Note  CSN: [id number] Arrival date & time: 05/09/20 1249    History Chief Complaint  Patient presents with  . colostomy bag    HPI  Amber Klein is a 70 y.o. female recently discharged from hospital in [place] after a prolonged (>42month) admission for ruptured diverticulitis and sepsis that required a partial colectomy and colostomy. She has been home for just a few days and noticed stool leaking from her ostomy device and under her ventral incision dressing. She has been feeling OK, but had a low grade fever today that was worrying to her given her recently illness.    Past Medical History:  Diagnosis Date  . Arthritis   . Cancer ([employer])    basal cell skin cancer  . Complication of anesthesia    "I went into shock". Decreased body temperature., WILL ONLY HAVE SURGERY WITH SPINAL ANESTHESIA ONLY!!!  . Dysrhythmia   . Ear infection   . Environmental and seasonal allergies   . Femoral hernia of right side with obstruction 01/18/2018  . GERD (gastroesophageal reflux disease)    no longer having issues takes TUMS occ.  Marland Kitchen History of kidney stones   . Hypotension   . Pneumonia 02/2016   walking pneumonia   . Pre-diabetes   . RBBB   . Sinus infection     Past Surgical History:  Procedure Laterality Date  . CATARACT EXTRACTION    . CESAREAN SECTION    . CO2 LASER APPLICATION N/A 08/14/6376   Procedure: CO2 LASER APPLICATION;  Surgeon: Vanessa Kick, MD;  Location: [facility];  Service: Gynecology;  Laterality: N/A;  . COLONOSCOPY    . COLOSTOMY    . FOOT SURGERY    . INGUINAL HERNIA REPAIR Right 01/18/2018   Procedure: OPEN REPAIR INCARCERATED RIGHT FEMORAL HERNIA WITH MESH;  Surgeon: Fanny Skates, MD;  Location: [facility];  Service: General;  Laterality: Right;  GENERAL / TAP BLOCK  . KNEE SURGERY    . ORIF ELBOW FRACTURE Left 07/04/2014   Procedure: OPEN REDUCTION INTERNAL FIXATION (ORIF) ELBOW/OLECRANON FRACTURE;   Surgeon: Meredith Pel, MD;  Location: [facility];  Service: Orthopedics;  Laterality: Left;  . REFRACTIVE SURGERY    . VULVECTOMY N/A 08/10/2016   Procedure: WIDE EXCISION VULVECTOMY;  Surgeon: Vanessa Kick, MD;  Location: [facility];  Service: Gynecology;  Laterality: N/A;    History reviewed. No pertinent family history.  Social History   Tobacco Use  . Smoking status: Former Smoker    Packs/day: 0.10    Years: 40.00    Pack years: 4.00    Types: Cigarettes    Quit date: 08/09/2014    Years since quitting: 5.7  . Smokeless tobacco: Never Used  Vaping Use  . Vaping Use: Never used  Substance Use Topics  . Alcohol use: Yes    Comment: 1/2 beer daily  . Drug use: No     Home Medications Prior to Admission medications   Medication Sig Start Date End Date Taking? Authorizing Provider  amoxicillin-clavulanate (AUGMENTIN) 875-125 MG tablet Take 1 tablet by mouth every 12 (twelve) hours. 05/09/20  Yes Truddie Hidden, MD  Ca Phosphate-Cholecalciferol (CALCIUM 500 + D3) 250-500 MG-UNIT CHEW Chew 1 tablet by mouth daily.    [provider]  Cholecalciferol (VITAMIN D3) 1000 units CAPS Take 1,000 Units by mouth daily.    [provider]  estradiol (ESTRACE) 2 MG tablet Take 1 mg by mouth daily.  [provider]  fexofenadine-pseudoephedrine (ALLEGRA-D) 60-120 MG 12 hr tablet Take 1 tablet by mouth daily as needed (allergies).    [provider]  latanoprost (XALATAN) 0.005 % ophthalmic solution Place 1 drop into both eyes daily.     [provider]  Multiple Vitamin (MULTIVITAMIN WITH MINERALS) TABS tablet Take 1 tablet by mouth daily.     [provider]  Multiple Vitamins-Calcium (VIACTIV MULTI-VITAMIN) CHEW Chew 1 tablet by mouth daily.    [provider]  progesterone (PROMETRIUM) 200 MG capsule Take 200 mgs once daily for 10 days every other month    [provider]  Red Yeast Rice Extract (RED YEAST RICE PO)  Take 1 capsule by mouth daily.     [provider]  traMADol (ULTRAM) 50 MG tablet Take 1-2 tablets (50-100 mg total) by mouth every 6 (six) hours as needed. 01/18/18   Fanny Skates, MD     Allergies    Iodinated diagnostic agents, Propofol, Shellfish allergy, Vancomycin, Ciprofloxacin, Sulfa antibiotics, Zithromax [azithromycin], Iodine, and Latex   Review of Systems   Review of Systems A comprehensive review of systems was completed and negative except as noted in HPI.    Physical Exam BP 133/83 (BP Location: Left Arm)   Pulse 99   Temp 99.4 F (37.4 C) (Oral)   Resp 16   Ht 5\' 5"  (1.651 m)   Wt 65.4 kg   SpO2 97%   BMI 24.00 kg/m   Physical Exam Vitals and nursing note reviewed.  Constitutional:      Appearance: Normal appearance.  HENT:     Head: Normocephalic and atraumatic.     Nose: Nose normal.     Mouth/Throat:     Mouth: Mucous membranes are moist.  Eyes:     Extraocular Movements: Extraocular movements intact.     Conjunctiva/sclera: Conjunctivae normal.  Cardiovascular:     Rate and Rhythm: Normal rate.  Pulmonary:     Effort: Pulmonary effort is normal.     Breath sounds: Normal breath sounds.  Abdominal:     General: Abdomen is flat.     Palpations: Abdomen is soft.     Tenderness: There is no abdominal tenderness.     Comments: Ventral incision with a small area of dehiscence in the most caudate portion with clear fluid drainage and packing in place, no signs of purulence or surrounding infection. Small amount of stool leaking has extended under the dressing but does not appear to have grossly contaminated her wound.  Musculoskeletal:        General: No swelling. Normal range of motion.     Cervical back: Neck supple.  Skin:    General: Skin is warm and dry.  Neurological:     General: No focal deficit present.     Mental Status: She is alert.  Psychiatric:        Mood and Affect: Mood normal.      ED Results / Procedures /  Treatments   Labs (all labs ordered are listed, but only abnormal results are displayed) Labs Reviewed  CBC WITH DIFFERENTIAL/PLATELET - Abnormal; Notable for the following components:      Result Value   RBC 3.42 (*)    Hemoglobin 10.0 (*)    HCT 32.4 (*)    RDW 16.6 (*)    Platelets 545 (*)    All other components within normal limits  BASIC METABOLIC PANEL  URINALYSIS, ROUTINE W REFLEX MICROSCOPIC    EKG  None   Radiology No results found.  Procedures Procedures  Medications Ordered in the ED Medications - No data to display   MDM Rules/Calculators/A&P MDM Patient with recently partial colectomy and colostomy for ruptured diverticulitis is anxious about her wound being contaminated. Dried stool will be cleaned from the area and a new ostomy device placed. Wound was cleaned and irrigated gently with saline by RN, dressing reapplied. Patient concerned about her low grade fever today, will check basic labs to establish baseline for her and to eval any signs of acute infection.   ED Course  I have reviewed the triage vital signs and the nursing notes.  Pertinent labs & imaging results that were available during my care of the patient were reviewed by me and considered in my medical decision making (see chart for details).  Clinical Course as of 05/09/20 1532  Sun May 09, 2020  1400 CBC with normal WBC.  [CS]  7017 BMP is normal. There is a small, punctate spot more cephalad on her incision which has a small amount purulent drainage. No signs of significant surrounding cellulits. Will obtain a culture swab and start on Augmentin. Recommend Gen Surg follow up for wound check, RTED for any other concerns.  [CS]    Clinical Course User Index [CS] Truddie Hidden, MD    Final Clinical Impression(s) / ED Diagnoses Final diagnoses:  Wound infection    Rx / DC Orders ED Discharge Orders         Ordered    amoxicillin-clavulanate (AUGMENTIN) 875-125 MG tablet  Every 12  hours        05/09/20 1531           Truddie Hidden, MD 05/09/20 [phone]

## 2020-05-09 NOTE — ED Notes (Signed)
MD at bedside. 

## 2020-05-10 ENCOUNTER — Telehealth: Payer: Self-pay | Admitting: Internal Medicine

## 2020-05-10 NOTE — Telephone Encounter (Signed)
Amber Klein called from [place], she needs verbal orders for nursing  1 x a week for 1 week 2 x a week for 2 weeks 1 x a week for 1 week  She also had a drug allergy check and she is allergic to iodine and she is taking Thera-M once daily

## 2020-05-11 LAB — AEROBIC CULTURE W GRAM STAIN (SUPERFICIAL SPECIMEN): Special Requests: NORMAL

## 2020-05-11 NOTE — Telephone Encounter (Signed)
Verbal orders given to [place] for PT

## 2020-05-11 NOTE — Telephone Encounter (Signed)
Ok for HH orders as requested 

## 2020-05-11 NOTE — Telephone Encounter (Signed)
[place] from [place] needs verbal orders for PT  Frequency  2 x a week for 3 weeks  [place]

## 2020-05-11 NOTE — Telephone Encounter (Signed)
Verbal orders given to [place]. 

## 2020-05-19 ENCOUNTER — Telehealth: Payer: Self-pay | Admitting: Internal Medicine

## 2020-05-19 NOTE — Telephone Encounter (Signed)
Amber Klein is calling and requesting verbal orders for home health OT for 1 week 3, please advise. CB is [phone] ok to leave message.

## 2020-05-19 NOTE — Telephone Encounter (Signed)
[place] for orders as requested.

## 2020-05-19 NOTE — Telephone Encounter (Signed)
Left message on machine for Manuela Schwartz with verbal orders

## 2020-05-20 ENCOUNTER — Other Ambulatory Visit: Payer: Self-pay

## 2020-05-21 ENCOUNTER — Encounter: Payer: Self-pay | Admitting: Internal Medicine

## 2020-05-21 ENCOUNTER — Ambulatory Visit: Payer: 59 | Admitting: Internal Medicine

## 2020-05-21 VITALS — BP 120/80 | HR 104 | Temp 99.0°F | Ht 64.5 in | Wt 143.6 lb

## 2020-05-21 DIAGNOSIS — I82621 Acute embolism and thrombosis of deep veins of right upper extremity: Secondary | ICD-10-CM | POA: Diagnosis not present

## 2020-05-21 DIAGNOSIS — R509 Fever, unspecified: Secondary | ICD-10-CM

## 2020-05-21 DIAGNOSIS — K219 Gastro-esophageal reflux disease without esophagitis: Secondary | ICD-10-CM | POA: Diagnosis not present

## 2020-05-21 DIAGNOSIS — K5792 Diverticulitis of intestine, part unspecified, without perforation or abscess without bleeding: Secondary | ICD-10-CM | POA: Diagnosis not present

## 2020-05-21 LAB — CBC WITH DIFFERENTIAL/PLATELET
Basophils Absolute: 0.1 10*3/uL (ref 0.0–0.1)
Basophils Relative: 0.9 % (ref 0.0–3.0)
Eosinophils Absolute: 0.1 10*3/uL (ref 0.0–0.7)
Eosinophils Relative: 1.8 % (ref 0.0–5.0)
HCT: 34.2 % — ABNORMAL LOW (ref 36.0–46.0)
Hemoglobin: 11.2 g/dL — ABNORMAL LOW (ref 12.0–15.0)
Lymphocytes Relative: 26.7 % (ref 12.0–46.0)
Lymphs Abs: 1.8 10*3/uL (ref 0.7–4.0)
MCHC: 32.7 g/dL (ref 30.0–36.0)
MCV: 90.7 fl (ref 78.0–100.0)
Monocytes Absolute: 0.6 10*3/uL (ref 0.1–1.0)
Monocytes Relative: 9.2 % (ref 3.0–12.0)
Neutro Abs: 4.1 10*3/uL (ref 1.4–7.7)
Neutrophils Relative %: 61.4 % (ref 43.0–77.0)
Platelets: 585 10*3/uL — ABNORMAL HIGH (ref 150.0–400.0)
RBC: 3.77 Mil/uL — ABNORMAL LOW (ref 3.87–5.11)
RDW: 16.2 % — ABNORMAL HIGH (ref 11.5–15.5)
WBC: 6.6 10*3/uL (ref 4.0–10.5)

## 2020-05-21 LAB — URINALYSIS
Bilirubin Urine: NEGATIVE
Hgb urine dipstick: NEGATIVE
Ketones, ur: NEGATIVE
Leukocytes,Ua: NEGATIVE
Nitrite: NEGATIVE
Specific Gravity, Urine: 1.01 (ref 1.000–1.030)
Total Protein, Urine: NEGATIVE
Urine Glucose: NEGATIVE
Urobilinogen, UA: 0.2 (ref 0.0–1.0)
pH: 7 (ref 5.0–8.0)

## 2020-05-21 LAB — COMPREHENSIVE METABOLIC PANEL
ALT: 14 U/L (ref 0–35)
AST: 18 U/L (ref 0–37)
Albumin: 3.7 g/dL (ref 3.5–5.2)
Alkaline Phosphatase: 80 U/L (ref 39–117)
BUN: 7 mg/dL (ref 6–23)
CO2: 32 mEq/L (ref 19–32)
Calcium: 9.7 mg/dL (ref 8.4–10.5)
Chloride: 98 mEq/L (ref 96–112)
Creatinine, Ser: 0.58 mg/dL (ref 0.40–1.20)
GFR: 93.41 mL/min (ref >60.00)
Glucose, Bld: 82 mg/dL (ref 70–99)
Potassium: 3.7 mEq/L (ref 3.5–5.1)
Sodium: 135 mEq/L (ref 135–145)
Total Bilirubin: 0.3 mg/dL (ref 0.2–1.2)
Total Protein: 7 g/dL (ref 6.0–8.3)

## 2020-05-21 MED ORDER — HYDROCODONE-ACETAMINOPHEN 5-325 MG PO TABS: 1.0000 | ORAL_TABLET | Freq: Four times a day (QID) | ORAL | 0 refills | Status: DC | PRN

## 2020-05-21 MED ORDER — ALBUTEROL SULFATE HFA 108 (90 BASE) MCG/ACT IN AERS: 2.0000 | INHALATION_SPRAY | Freq: Four times a day (QID) | RESPIRATORY_TRACT | 2 refills | Status: DC | PRN

## 2020-05-21 MED ORDER — AMOXICILLIN-POT CLAVULANATE 875-125 MG PO TABS: 1.0000 | ORAL_TABLET | Freq: Two times a day (BID) | ORAL | 0 refills | Status: DC

## 2020-05-21 NOTE — Progress Notes (Signed)
New Patient Office Visit     This visit occurred during the SARS-CoV-2 public health emergency.  Safety protocols were in place, including screening questions prior to the visit, additional usage of staff PPE, and extensive cleaning of exam room while observing appropriate contact time as indicated for disinfecting solutions.    CC/Reason for Visit: Establish care, discuss chronic medical conditions, medication refills Previous PCP: Simona Huh, NP Last Visit: Unknown  HPI: Amber Klein is a 70 y.o. female who is coming in today for the above mentioned reasons. Past Medical History is significant for: GERD on Protonix.  She has been in really good health until February of this year.  While in [place] she developed a flareup of her diverticulitis, this ended up rupturing require emergent care.  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  She was hospitalized for 33 days.  During this hospitalization she developed a right upper extremity DVT at her PICC line site.  She is currently on Eliquis for this.  After coming back to [place] she developed leakage from her colostomy bag that appears to have caused a slight wound infection at her incision site.  She visited our local emergency department and was given a prescription for Augmentin.  She tells me that despite all this she continues to have low-grade temperatures at home.  The highest she has measured is 100.2 but has not gone lower than 99.  She feels worn out.  She has been having home health therapy for both PT as well as wound care.  She has her first follow-up with the surgeon next week.  She is requesting a refill of hydrocodone that she has been using sporadically for pain.  She is wondering if she needs more antibiotics as her wound is still dechisced and draining.   Past Medical/Surgical History: Past Medical History:  Diagnosis Date  . Arthritis   . Cancer ([place])    basal cell skin cancer  .  Complication of anesthesia    "I went into shock". Decreased body temperature., WILL ONLY HAVE SURGERY WITH SPINAL ANESTHESIA ONLY!!!  . Dysrhythmia   . Ear infection   . Environmental and seasonal allergies   . Femoral hernia of right side with obstruction 01/18/2018  . GERD (gastroesophageal reflux disease)    no longer having issues takes TUMS occ.  Marland Kitchen History of kidney stones   . Hypotension   . Pneumonia 02/2016   walking pneumonia   . Pre-diabetes   . RBBB   . Sinus infection     Past Surgical History:  Procedure Laterality Date  . CATARACT EXTRACTION    . CESAREAN SECTION    . CO2 LASER APPLICATION N/A 05/06/4008   Procedure: CO2 LASER APPLICATION;  Surgeon: Vanessa Kick, MD;  Location: [facility];  Service: Gynecology;  Laterality: N/A;  . COLONOSCOPY    . COLOSTOMY    . FOOT SURGERY    . INGUINAL HERNIA REPAIR Right 01/18/2018   Procedure: OPEN REPAIR INCARCERATED RIGHT FEMORAL HERNIA WITH MESH;  Surgeon: Fanny Skates, MD;  Location: [facility];  Service: General;  Laterality: Right;  GENERAL / TAP BLOCK  . KNEE SURGERY    . ORIF ELBOW FRACTURE Left 07/04/2014   Procedure: OPEN REDUCTION INTERNAL FIXATION (ORIF) ELBOW/OLECRANON FRACTURE;  Surgeon: Meredith Pel, MD;  Location: [facility];  Service: Orthopedics;  Laterality: Left;  . REFRACTIVE SURGERY    . VULVECTOMY N/A 08/10/2016   Procedure: WIDE EXCISION VULVECTOMY;  Surgeon: Vanessa Kick, MD;  Location: [facility];  Service: Gynecology;  Laterality: N/A;    Social History:  reports that she quit smoking about 5 years ago. Her smoking use included cigarettes. She has a 4.00 pack-year smoking history. She has never used smokeless tobacco. She reports current alcohol use. She reports that she does not use drugs.  Allergies: Allergies  Allergen Reactions  . Iodinated Diagnostic Agents Anaphylaxis  . Propofol Anaphylaxis  . Shellfish Allergy Anaphylaxis  . Vancomycin Anaphylaxis  . Ciprofloxacin Hives and Swelling    Hives  .  Sulfa Antibiotics Swelling and Other (See Comments)    Migraine  . Zithromax [Azithromycin] Hives  . Iodine     UNSPECIFIED REACTION   . Latex Rash    Sensitivity on too long    Family History:  History of heart disease, cancer, stroke that she is aware of  Current Outpatient Medications:  .  amoxicillin-clavulanate (AUGMENTIN) 875-125 MG tablet, Take 1 tablet by mouth every 12 (twelve) hours., Disp: 14 tablet, Rfl: 0 .  amoxicillin-clavulanate (AUGMENTIN) 875-125 MG tablet, Take 1 tablet by mouth 2 (two) times daily., Disp: 20 tablet, Rfl: 0 .  Ca Phosphate-Cholecalciferol 250-500 MG-UNIT CHEW, Chew 1 tablet by mouth daily., Disp: , Rfl:  .  Cholecalciferol (VITAMIN D3) 1000 units CAPS, Take 1,000 Units by mouth daily., Disp: , Rfl:  .  estradiol (ESTRACE) 2 MG tablet, Take 1 mg by mouth daily., Disp: , Rfl:  .  fexofenadine (ALLEGRA) 60 MG tablet, Take 60 mg by mouth 2 (two) times daily., Disp: , Rfl:  .  fexofenadine-pseudoephedrine (ALLEGRA-D) 60-120 MG 12 hr tablet, Take 1 tablet by mouth daily as needed (allergies)., Disp: , Rfl:  .  latanoprost (XALATAN) 0.005 % ophthalmic solution, Place 1 drop into both eyes daily. , Disp: , Rfl:  .  Multiple Vitamin (MULTIVITAMIN WITH MINERALS) TABS tablet, Take 1 tablet by mouth daily. , Disp: , Rfl:  .  Multiple Vitamins-Calcium (VIACTIV MULTI-VITAMIN) CHEW, Chew 1 tablet by mouth daily., Disp: , Rfl:  .  progesterone (PROMETRIUM) 200 MG capsule, Take 200 mgs once daily for 10 days every other month, Disp: , Rfl:  .  albuterol (VENTOLIN HFA) 108 (90 Base) MCG/ACT inhaler, Inhale 2 puffs into the lungs every 6 (six) hours as needed for wheezing or shortness of breath., Disp: 18 g, Rfl: 2  Review of Systems: Constitutional: Positive for fever, chills, diaphoresis and fatigue.  HEENT: Denies photophobia, eye pain, redness, hearing loss, ear pain, congestion, sore throat, rhinorrhea, sneezing, mouth sores, trouble swallowing, neck pain, neck  stiffness and tinnitus.   Respiratory: Denies SOB, DOE, cough, chest tightness,  and wheezing.   Cardiovascular: Denies chest pain, palpitations and leg swelling.  Gastrointestinal: Denies nausea, vomiting,  diarrhea, constipation, blood in stool and abdominal distention.  Genitourinary: Denies dysuria, urgency, frequency, hematuria, flank pain and difficulty urinating.  Endocrine: Denies: hot or cold intolerance, sweats, changes in hair or nails, polyuria, polydipsia. Musculoskeletal: Denies myalgias, back pain, joint swelling, arthralgias and gait problem.  Skin: Denies pallor, rash and wound.  Neurological: Denies dizziness, seizures, syncope, weakness, light-headedness, numbness and headaches.  Hematological: Denies adenopathy. Easy bruising, personal or family bleeding history  Psychiatric/Behavioral: Denies suicidal ideation, mood changes, confusion, nervousness, sleep disturbance and agitation    Physical Exam: Vitals:   05/21/20 0933  BP: 120/80  Pulse: (!) 104  Temp: 99 F (37.2 C)  TempSrc: Oral  SpO2: 97%  Weight: 143 lb 9.6 oz (65.1 kg)  Height: 5'  4.5" (1.638 m)   Body mass index is 24.27 kg/m.   Constitutional: NAD, calm, comfortable Eyes: PERRL, lids and conjunctivae normal, wears corrective lenses ENMT: Mucous membranes are moist.  Respiratory: clear to auscultation bilaterally, no wheezing, no crackles. Normal respiratory effort. No accessory muscle use.  Cardiovascular: Regular rate and rhythm, no murmurs / rubs / gallops. No extremity edema. Abdomen: no tenderness, no masses palpated. No hepatosplenomegaly. Bowel sounds positive.  Skin: Abdominal incision is well-healed with the exception of a small area that appears dehisced and draining. Neurologic: Grossly intact and nonfocal Psychiatric: Normal judgment and insight. Alert and oriented x 3. Normal mood.    Impression and Plan:  Acute deep vein thrombosis (DVT) of right upper extremity, unspecified vein  (HCC) -Continue Eliquis, will plan for a 48-month course.  This will be scheduled to end in September 2022.  History of diverticulitis with rupture -Status post recent colectomy, colostomy with prolonged hospitalization in February and March of this year. -She has what appears to be a slight wound infection. -I will give her another course of Augmentin, she has her follow-up with surgery scheduled for next week.  Gastroesophageal reflux disease without esophagitis  -She is on daily Protonix.  Fever, unspecified fever cause  - Plan: CBC with Differential/Platelet, Comprehensive metabolic panel, Culture, Blood, U/A, urine culture -Suspect secondary to wound infection, however I will order above labs to make sure no other source.    Lelon Frohlich, MD [facility]

## 2020-05-26 ENCOUNTER — Ambulatory Visit: Payer: 59 | Admitting: Internal Medicine

## 2020-05-26 ENCOUNTER — Encounter: Payer: Self-pay | Admitting: Internal Medicine

## 2020-05-26 ENCOUNTER — Other Ambulatory Visit: Payer: Self-pay

## 2020-05-26 VITALS — BP 110/67 | HR 105 | Temp 99.0°F | Wt 145.3 lb

## 2020-05-26 DIAGNOSIS — J029 Acute pharyngitis, unspecified: Secondary | ICD-10-CM

## 2020-05-26 DIAGNOSIS — B370 Candidal stomatitis: Secondary | ICD-10-CM

## 2020-05-26 MED ORDER — FLUCONAZOLE 100 MG PO TABS: 100.0000 mg | ORAL_TABLET | Freq: Every day | ORAL | 0 refills | Status: DC

## 2020-05-26 MED ORDER — NYSTATIN 100000 UNIT/ML MT SUSP: 5.0000 mL | Freq: Four times a day (QID) | OROMUCOSAL | 0 refills | Status: DC

## 2020-05-26 NOTE — Patient Instructions (Signed)
-Nice seeing you today!!  -Fluconazole 100 mg daily for 7 days.  -Nystatin suspension 5 ml four times a day for 7 days.   Oral Thrush, Adult Oral thrush, also called oral candidiasis, is a fungal infection that develops in the mouth and throat and on the tongue. It causes white patches to form in the mouth and on the tongue. Many cases of thrush are mild, but this infection can also be serious. Amber Klein can be a repeated (recurrent) problem for certain people who have a weak body defense system (immune system). The weakness can be caused by chronic illnesses, or by taking medicines that limit the body's ability to fight infection. If a person has difficulty fighting infection, the fungus that causes thrush can spread through the body. This can cause life-threatening blood or organ infections. What are the causes? This condition is caused by a fungus (yeast) called Candida albicans.  This fungus is normally present in small amounts in the mouth and on other mucous membranes. It usually causes no harm.  If conditions are present that allow the fungus to grow without control, it invades surrounding tissues and becomes an infection.  Other Candida species can also lead to thrush, though this is rare. What increases the risk? The following factors may make you more likely to develop this condition:  Having a weakened immune system.  Being an older adult.  Having diabetes, cancer, or HIV (human immunodeficiency virus).  Having dry mouth (xerostomia).  Being pregnant or breastfeeding.  Having poor dental care, especially in those who have dentures.  Using antibiotic or steroid medicines. What are the signs or symptoms? Symptoms of this condition can vary from mild and moderate to severe and persistent. Symptoms may include:  A burning feeling in the mouth and throat. This can occur at the start of a thrush infection.  White patches that stick to the mouth and tongue. The tissue around  the patches may be red, raw, and painful. If rubbed (during tooth brushing, for example), the patches and the tissue of the mouth may bleed easily.  A bad taste in the mouth or difficulty tasting foods.  A cottony feeling in the mouth.  Pain during eating and swallowing.  Poor appetite.  Cracking at the corners of the mouth.   How is this diagnosed? This condition is diagnosed based on:  A physical exam.  Your medical history. How is this treated? This condition is treated with medicines called antifungals, which prevent the growth of fungi. These medicines are either applied directly to the affected area (topical) or swallowed (oral). The treatment will depend on the severity of the condition.  Mild cases of thrush may be treated with an antifungal mouth rinse or lozenges. Treatment usually lasts about 14 days.  Moderate to severe cases of thrush can be treated with oral antifungal medicine, if they have spread to the esophagus. A topical antifungal medicine may also be used. For some severe infections, treatment may need to continue for more than 14 days. ? Oral antifungal medicines are rarely used during pregnancy because they may be harmful to the unborn child. If you are pregnant, talk with your health care provider about options for treatment.  Persistent or recurrent thrush. For cases of thrush that do not go away or keep coming back: ? Treatment may be needed twice as long as the symptoms last. ? Treatment will include both oral and topical antifungal medicines. ? People with a weakened immune system can take an antifungal  medicine on a continuous basis to prevent thrush infections. It is important to treat conditions that make a person more likely to get thrush, such as diabetes or HIV. Follow these instructions at home: Medicines  Take or use over-the-counter and prescription medicines only as told by your health care provider.  Talk with your health care provider about  an over-the-counter medicine called gentian violet, which kills bacteria and fungi. Relieving soreness and discomfort To help reduce the discomfort of thrush:  Drink cold liquids such as water or iced tea.  Try flavored ice treats or frozen juices.  Eat foods that are easy to swallow, such as gelatin, ice cream, or custard.  Try drinking from a straw if the patches in your mouth are painful.   General instructions  Eat plain, unflavored yogurt as directed by your health care provider. Check the label to make sure the yogurt contains live cultures. This yogurt can help healthy bacteria grow in the mouth and can stop the growth of the fungus that causes thrush.  If you wear dentures, remove the dentures before going to bed, brush them vigorously, and soak them in a cleaning solution as directed by your health care provider.  Rinse your mouth with a warm salt-water mixture several times a day. To make a salt-water mixture, dissolve -1 tsp (3-6 g) of salt in 1 cup (237 mL) of warm water. Contact a health care provider if:  Your symptoms are getting worse or are not improving within 7 days of starting treatment.  You have symptoms of a spreading infection, such as white patches on the skin outside of the mouth.  You are breastfeeding your baby and you have redness and pain in the nipples. Summary  Oral thrush, also called oral candidiasis, is a fungal infection that develops in the mouth and throat and on the tongue. It causes white patches to form in the mouth and on the tongue.  You are more likely to get this condition if you have a weakened immune system or an underlying condition, such as HIV, cancer, or diabetes.  This condition is treated with medicines called antifungals, which prevent the growth of fungi.  Contact a health care provider if your symptoms do not improve, or get worse, within 7 days of starting treatment. This information is not intended to replace advice given to  you by your health care provider. Make sure you discuss any questions you have with your health care provider. Document Revised: 12/06/2018 Document Reviewed: 12/06/2018 Elsevier Patient Education  [place].

## 2020-05-26 NOTE — Progress Notes (Signed)
Established Patient Office Visit     This visit occurred during the SARS-CoV-2 public health emergency.  Safety protocols were in place, including screening questions prior to the visit, additional usage of staff PPE, and extensive cleaning of exam room while observing appropriate contact time as indicated for disinfecting solutions.    CC/Reason for Visit: Continued sore throat  HPI: Amber Klein is a 70 y.o. female who is coming in today for the above mentioned reasons.  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  She had been having low-grade temperatures at home.  She has continued to have a significantly sore throat.  She met with the surgeon earlier this week who sent her here for evaluation.  She is currently on Augmentin for a small wound infection.  Past Medical/Surgical History: Past Medical History:  Diagnosis Date  . Arthritis   . Cancer ([place])    basal cell skin cancer  . Complication of anesthesia    "I went into shock". Decreased body temperature., WILL ONLY HAVE SURGERY WITH SPINAL ANESTHESIA ONLY!!!  . Dysrhythmia   . Ear infection   . Environmental and seasonal allergies   . Femoral hernia of right side with obstruction 01/18/2018  . GERD (gastroesophageal reflux disease)    no longer having issues takes TUMS occ.  Marland Kitchen History of kidney stones   . Hypotension   . Pneumonia 02/2016   walking pneumonia   . Pre-diabetes   . RBBB   . Sinus infection     Past Surgical History:  Procedure Laterality Date  . CATARACT EXTRACTION    . CESAREAN SECTION    . CO2 LASER APPLICATION N/A 3/79/0240   Procedure: CO2 LASER APPLICATION;  Surgeon: Vanessa Kick, MD;  Location: [facility];  Service: Gynecology;  Laterality: N/A;  . COLONOSCOPY    . COLOSTOMY    . FOOT SURGERY    . INGUINAL HERNIA REPAIR Right 01/18/2018   Procedure: OPEN REPAIR INCARCERATED  RIGHT FEMORAL HERNIA WITH MESH;  Surgeon: Fanny Skates, MD;  Location: [facility];  Service: General;  Laterality: Right;  GENERAL / TAP BLOCK  . KNEE SURGERY    . ORIF ELBOW FRACTURE Left 07/04/2014   Procedure: OPEN REDUCTION INTERNAL FIXATION (ORIF) ELBOW/OLECRANON FRACTURE;  Surgeon: Meredith Pel, MD;  Location: [facility];  Service: Orthopedics;  Laterality: Left;  . REFRACTIVE SURGERY    . VULVECTOMY N/A 08/10/2016   Procedure: WIDE EXCISION VULVECTOMY;  Surgeon: Vanessa Kick, MD;  Location: [facility];  Service: Gynecology;  Laterality: N/A;    Social History:  reports that she quit smoking about 5 years ago. Her smoking use included cigarettes. She has a 4.00 pack-year smoking history. She has never used smokeless tobacco. She reports current alcohol use. She reports that she does not use drugs.  Allergies: Allergies  Allergen Reactions  . Iodinated Diagnostic Agents Anaphylaxis  . Propofol Anaphylaxis  . Shellfish Allergy Anaphylaxis  . Vancomycin Anaphylaxis  . Ciprofloxacin Hives and Swelling    Hives  . Sulfa Antibiotics Swelling and Other (See Comments)    Migraine  . Zithromax [Azithromycin] Hives  . Iodine     UNSPECIFIED REACTION   . Latex Rash    Sensitivity on too long    Family History:  No history of heart disease, cancer, stroke that she is aware of.  Current Outpatient Medications:  .  albuterol (VENTOLIN HFA)  108 (90 Base) MCG/ACT inhaler, Inhale 2 puffs into the lungs every 6 (six) hours as needed for wheezing or shortness of breath., Disp: 18 g, Rfl: 2 .  amoxicillin-clavulanate (AUGMENTIN) 875-125 MG tablet, Take 1 tablet by mouth every 12 (twelve) hours., Disp: 14 tablet, Rfl: 0 .  amoxicillin-clavulanate (AUGMENTIN) 875-125 MG tablet, Take 1 tablet by mouth 2 (two) times daily., Disp: 20 tablet, Rfl: 0 .  apixaban (ELIQUIS) 5 MG TABS tablet, Take 5 mg by mouth 2 (two) times daily., Disp: , Rfl:  .  Ca Phosphate-Cholecalciferol 250-500 MG-UNIT CHEW, Chew 1  tablet by mouth daily., Disp: , Rfl:  .  Cholecalciferol (VITAMIN D3) 1000 units CAPS, Take 1,000 Units by mouth daily., Disp: , Rfl:  .  estradiol (ESTRACE) 2 MG tablet, Take 1 mg by mouth daily., Disp: , Rfl:  .  ferrous sulfate 325 (65 FE) MG tablet, Take 325 mg by mouth daily with breakfast., Disp: , Rfl:  .  fexofenadine (ALLEGRA) 60 MG tablet, Take 60 mg by mouth 2 (two) times daily., Disp: , Rfl:  .  fexofenadine-pseudoephedrine (ALLEGRA-D) 60-120 MG 12 hr tablet, Take 1 tablet by mouth daily as needed (allergies)., Disp: , Rfl:  .  fluconazole (DIFLUCAN) 100 MG tablet, Take 1 tablet (100 mg total) by mouth daily for 7 days., Disp: 7 tablet, Rfl: 0 .  HYDROcodone-acetaminophen (NORCO/VICODIN) 5-325 MG tablet, Take 1 tablet by mouth every 6 (six) hours as needed for moderate pain., Disp: 30 tablet, Rfl: 0 .  latanoprost (XALATAN) 0.005 % ophthalmic solution, Place 1 drop into both eyes daily. , Disp: , Rfl:  .  Multiple Vitamin (MULTIVITAMIN WITH MINERALS) TABS tablet, Take 1 tablet by mouth daily. , Disp: , Rfl:  .  Multiple Vitamins-Calcium (VIACTIV MULTI-VITAMIN) CHEW, Chew 1 tablet by mouth daily., Disp: , Rfl:  .  nystatin (MYCOSTATIN) 100000 UNIT/ML suspension, Take 5 mLs (500,000 Units total) by mouth 4 (four) times daily., Disp: 150 mL, Rfl: 0 .  pantoprazole (PROTONIX) 20 MG tablet, Take 20 mg by mouth daily., Disp: , Rfl:  .  progesterone (PROMETRIUM) 200 MG capsule, Take 200 mgs once daily for 10 days every other month, Disp: , Rfl:   Review of Systems:  Constitutional: Denies fever, chills, diaphoresis, appetite change and fatigue.  HEENT: Denies photophobia, eye pain, redness, hearing loss, ear pain, congestion,  rhinorrhea, sneezing,  neck stiffness and tinnitus.   Respiratory: Denies SOB, DOE, cough, chest tightness,  and wheezing.   Cardiovascular: Denies chest pain, palpitations and leg swelling.  Gastrointestinal: Denies nausea, vomiting, abdominal pain, diarrhea,  constipation, blood in stool and abdominal distention.  Genitourinary: Denies dysuria, urgency, frequency, hematuria, flank pain and difficulty urinating.  Endocrine: Denies: hot or cold intolerance, sweats, changes in hair or nails, polyuria, polydipsia. Musculoskeletal: Denies myalgias, back pain, joint swelling, arthralgias and gait problem.  Skin: Denies pallor, rash and wound.  Neurological: Denies dizziness, seizures, syncope, weakness, light-headedness, numbness and headaches.  Hematological: Denies adenopathy. Easy bruising, personal or family bleeding history  Psychiatric/Behavioral: Denies suicidal ideation, mood changes, confusion, nervousness, sleep disturbance and agitation    Physical Exam: Vitals:   05/26/20 1532  BP: 110/67  Pulse: (!) 105  Temp: 99 F (37.2 C)  TempSrc: Oral  SpO2: 97%  Weight: 145 lb 4.8 oz (65.9 kg)    Body mass index is 24.56 kg/m.   Constitutional: NAD, calm, comfortable Eyes: PERRL, lids and conjunctivae normal, wears corrective lenses ENMT: Mucous membranes are moist. Posterior pharynx is erythematous.  She has small white punctate lesions on her soft palate and posterior aspect of her tongue.  Normal dentition. Tympanic membrane is pearly white, no erythema or bulging. Neck: normal, supple, no masses, no thyromegaly Neurologic: Grossly intact and nonfocal Psychiatric: Normal judgment and insight. Alert and oriented x 3. Normal mood.    Impression and Plan:  Oral thrush - Plan: fluconazole (DIFLUCAN) 100 MG tablet, nystatin (MYCOSTATIN) 100000 UNIT/ML suspension Sore throat  -She will be prescribed fluconazole daily for 7 days in addition to nystatin suspension to use 4 times daily. -She will follow-up with me in 2 weeks if no improvement.    Patient Instructions   -Nice seeing you today!!  -Fluconazole 100 mg daily for 7 days.  -Nystatin suspension 5 ml four times a day for 7 days.   Oral Thrush, Adult Oral thrush, also  called oral candidiasis, is a fungal infection that develops in the mouth and throat and on the tongue. It causes white patches to form in the mouth and on the tongue. Many cases of thrush are mild, but this infection can also be serious. Ritta Slot can be a repeated (recurrent) problem for certain people who have a weak body defense system (immune system). The weakness can be caused by chronic illnesses, or by taking medicines that limit the body's ability to fight infection. If a person has difficulty fighting infection, the fungus that causes thrush can spread through the body. This can cause life-threatening blood or organ infections. What are the causes? This condition is caused by a fungus (yeast) called Candida albicans.  This fungus is normally present in small amounts in the mouth and on other mucous membranes. It usually causes no harm.  If conditions are present that allow the fungus to grow without control, it invades surrounding tissues and becomes an infection.  Other Candida species can also lead to thrush, though this is rare. What increases the risk? The following factors may make you more likely to develop this condition:  Having a weakened immune system.  Being an older adult.  Having diabetes, cancer, or HIV (human immunodeficiency virus).  Having dry mouth (xerostomia).  Being pregnant or breastfeeding.  Having poor dental care, especially in those who have dentures.  Using antibiotic or steroid medicines. What are the signs or symptoms? Symptoms of this condition can vary from mild and moderate to severe and persistent. Symptoms may include:  A burning feeling in the mouth and throat. This can occur at the start of a thrush infection.  White patches that stick to the mouth and tongue. The tissue around the patches may be red, raw, and painful. If rubbed (during tooth brushing, for example), the patches and the tissue of the mouth may bleed easily.  A bad taste in  the mouth or difficulty tasting foods.  A cottony feeling in the mouth.  Pain during eating and swallowing.  Poor appetite.  Cracking at the corners of the mouth.   How is this diagnosed? This condition is diagnosed based on:  A physical exam.  Your medical history. How is this treated? This condition is treated with medicines called antifungals, which prevent the growth of fungi. These medicines are either applied directly to the affected area (topical) or swallowed (oral). The treatment will depend on the severity of the condition.  Mild cases of thrush may be treated with an antifungal mouth rinse or lozenges. Treatment usually lasts about 14 days.  Moderate to severe cases of thrush can be treated with oral  antifungal medicine, if they have spread to the esophagus. A topical antifungal medicine may also be used. For some severe infections, treatment may need to continue for more than 14 days. ? Oral antifungal medicines are rarely used during pregnancy because they may be harmful to the unborn child. If you are pregnant, talk with your health care provider about options for treatment.  Persistent or recurrent thrush. For cases of thrush that do not go away or keep coming back: ? Treatment may be needed twice as long as the symptoms last. ? Treatment will include both oral and topical antifungal medicines. ? People with a weakened immune system can take an antifungal medicine on a continuous basis to prevent thrush infections. It is important to treat conditions that make a person more likely to get thrush, such as diabetes or HIV. Follow these instructions at home: Medicines  Take or use over-the-counter and prescription medicines only as told by your health care provider.  Talk with your health care provider about an over-the-counter medicine called gentian violet, which kills bacteria and fungi. Relieving soreness and discomfort To help reduce the discomfort of  thrush:  Drink cold liquids such as water or iced tea.  Try flavored ice treats or frozen juices.  Eat foods that are easy to swallow, such as gelatin, ice cream, or custard.  Try drinking from a straw if the patches in your mouth are painful.   General instructions  Eat plain, unflavored yogurt as directed by your health care provider. Check the label to make sure the yogurt contains live cultures. This yogurt can help healthy bacteria grow in the mouth and can stop the growth of the fungus that causes thrush.  If you wear dentures, remove the dentures before going to bed, brush them vigorously, and soak them in a cleaning solution as directed by your health care provider.  Rinse your mouth with a warm salt-water mixture several times a day. To make a salt-water mixture, dissolve -1 tsp (3-6 g) of salt in 1 cup (237 mL) of warm water. Contact a health care provider if:  Your symptoms are getting worse or are not improving within 7 days of starting treatment.  You have symptoms of a spreading infection, such as white patches on the skin outside of the mouth.  You are breastfeeding your baby and you have redness and pain in the nipples. Summary  Oral thrush, also called oral candidiasis, is a fungal infection that develops in the mouth and throat and on the tongue. It causes white patches to form in the mouth and on the tongue.  You are more likely to get this condition if you have a weakened immune system or an underlying condition, such as HIV, cancer, or diabetes.  This condition is treated with medicines called antifungals, which prevent the growth of fungi.  Contact a health care provider if your symptoms do not improve, or get worse, within 7 days of starting treatment. This information is not intended to replace advice given to you by your health care provider. Make sure you discuss any questions you have with your health care provider. Document Revised: 12/06/2018 Document  Reviewed: 12/06/2018 Elsevier Patient Education  2021 Santel, MD [facility]

## 2020-05-27 LAB — CULTURE, BLOOD (SINGLE): MICRO NUMBER:: 11748459

## 2020-05-27 LAB — URINE CULTURE
MICRO NUMBER:: 11748463
SPECIMEN QUALITY:: ADEQUATE

## 2020-06-01 ENCOUNTER — Other Ambulatory Visit: Payer: Self-pay | Admitting: Internal Medicine

## 2020-06-01 DIAGNOSIS — B370 Candidal stomatitis: Secondary | ICD-10-CM

## 2020-06-07 ENCOUNTER — Ambulatory Visit (HOSPITAL_COMMUNITY)
Admission: RE | Admit: 2020-06-07 | Discharge: 2020-06-07 | Disposition: A | Discharge status: Home / Self Care | Payer: 59 | Source: Ambulatory Visit | Attending: General Surgery | Admitting: General Surgery

## 2020-06-07 ENCOUNTER — Other Ambulatory Visit: Payer: Self-pay

## 2020-06-07 DIAGNOSIS — K9409 Other complications of colostomy: Secondary | ICD-10-CM | POA: Diagnosis not present

## 2020-06-07 DIAGNOSIS — Z433 Encounter for attention to colostomy: Secondary | ICD-10-CM | POA: Insufficient documentation

## 2020-06-07 NOTE — Progress Notes (Signed)
[facility]   Reason for visit:  Creasing around stoma, creating area of stool that pools and is breaking skin down.  Stoma is retracted and much smaller. New pattern required as current opening is cut too large.  HPI:  Perforated diverticulum with colostomy, retracted stoma ROS  Review of Systems  Gastrointestinal: Positive for constipation.  Skin: Positive for rash.  All other systems reviewed and are negative.  Vital signs:  BP 113/74   Pulse (!) 105   Temp 98.6 F (37 C) (Oral)   Resp 18   SpO2 97%  Exam:  Physical Exam Constitutional:      Appearance: Normal appearance.    Abdominal:     General: A surgical scar is present. The ostomy site is clean. Bowel sounds are normal.    Skin:    General: Skin is warm.     Findings: Wound present.       Neurological:     Mental Status: She is alert.  Psychiatric:        Behavior: Behavior is cooperative.     Stoma type/location:  LLQ colostomy Stomal assessment/size:  Retracted stoma  1/2" located in creasing at 3 and 9 o'clock.  Peristomal assessment:  Partial thickness denuded skin  Treatment options for stomal/peristomal skin: crusting with stoma powder and skin prep.  Barrier rings flattened into strips in creasing. Add osotmy belt. Output: soft brown stool Ostomy pouching: 2pc. 2 3/4" pouch.  With barrier ring, stoma powder and no sting skin prep  Add ostomy belt.  Education provided:  Demonstrated pouch change and explained rationale for strips in creasing, crusting skin breakdown with stoma powder and skin prep. Applied ostomy belt.     Impression/dx  Retracted stoma and creasing at 3 and 9 o;clock resulting in partial thickness tissue loss  Discussion  Protect skin with stoma powder and skin prep. Barrier ring (pulled into strips for creasing and along bottom half of peristomal skin to protect and promote healing) Plan  Will follow up to assess new process for pouching.  Will need writtten script  for new supplies.     Visit time: 70 minutes.   Domenic Moras FNP-BC

## 2020-06-07 NOTE — Discharge Instructions (Signed)
Add powder and skin prep x 3 to peristomal skin-allow to air dry Barrier ring pulled into strips at 3 and 9 o'clock crease and along bottom half to protect skin Add ostomy belt to pouch Will follow up to assess need for further visits.

## 2020-06-09 ENCOUNTER — Other Ambulatory Visit: Payer: Self-pay | Admitting: Internal Medicine

## 2020-06-09 DIAGNOSIS — B370 Candidal stomatitis: Secondary | ICD-10-CM

## 2020-06-11 ENCOUNTER — Other Ambulatory Visit: Payer: Self-pay

## 2020-06-11 ENCOUNTER — Ambulatory Visit (HOSPITAL_COMMUNITY)
Admission: RE | Admit: 2020-06-11 | Discharge: 2020-06-11 | Disposition: A | Discharge status: Home / Self Care | Payer: 59 | Source: Ambulatory Visit | Attending: General Surgery | Admitting: General Surgery

## 2020-06-11 DIAGNOSIS — K9403 Colostomy malfunction: Secondary | ICD-10-CM | POA: Insufficient documentation

## 2020-06-11 DIAGNOSIS — K9400 Colostomy complication, unspecified: Secondary | ICD-10-CM

## 2020-06-11 NOTE — Discharge Instructions (Signed)
Switch to 1 piece flexible pouch. Continue skin prep, powder and barrier ring.  COntinue ostomy belt.  Gentle warmth to pouch after application to promote seal.

## 2020-06-11 NOTE — Progress Notes (Signed)
[facility]   Reason for visit:  LMQ colostomy  Continues to leak.  Some bleeding noted from stoma, distressing to patient.  Scant and appears to be related to stool exposure.  HPI:  Diverticulitis with perforation and colostomy ROS  Review of Systems  Constitutional: Positive for unexpected weight change.  Gastrointestinal: Positive for abdominal pain.  Skin: Positive for wound.  All other systems reviewed and are negative.  Vital signs:  BP 128/77   Pulse 100   Temp 97.8 F (36.6 C)   Resp 18   SpO2 98%  Exam:  Physical Exam Vitals reviewed.  Constitutional:      Appearance: Normal appearance.  Abdominal:       Comments: LMQ colostomy with peristomal breakdown.   Skin:         Stoma type/location:  LMQ colostomy Stomal assessment/size:  1 cm recessed stoma.  Located in deep creasing.  Wearing a belt and has always preferred 2 piece system.  I think the rigid plastic of the 2 piece is causing loss of seal with position changes and have recommended switching to 1 piece soft convexity for maximum flexibility.  Peristomal assessment:  Denuded skin along lower hemisphere of peristomal skin. Creasing at 3 and 9.  Treatment options for stomal/peristomal skin: Barrier ring and barrier strip at 3 and 9 oclock crease.  Skin prep and stoma powder to denuded skin.  1 piece flexible convex pouch with belt.   Output:firm dark stool. Many times her pouch just blows off. Has filtered pouch. We discuss avoiding clothing that cinches the pouch and restricts stool fromfalling to bottom of pouch.  Ostomy pouching: 1pc.flexible convex with belt. Barrier ring , skin prep and powder to peristomal skin.  Education provided:See above.  Pouch change performed with new pouch in place.     Impression/dx  Recessed, small stoma.  Peristomal creasing.  Discussion  Switch to 1 piece flexible convex pouch and belt.  Continue skin prep and barrier ring to protect peristomal skin Plan   Sent home with 2 additional pouches.      Visit time: 55  minutes.   Domenic Moras FNP-BC

## 2020-06-16 ENCOUNTER — Telehealth: Payer: Self-pay | Admitting: Internal Medicine

## 2020-06-16 DIAGNOSIS — B370 Candidal stomatitis: Secondary | ICD-10-CM

## 2020-06-16 NOTE — Telephone Encounter (Signed)
Pt is calling in wanting to know if there is something else that she can do about the reoccurring thrush stating that when she stops taking the medication that was given to her for it it comes back.  Pt would like to have blood work done to see if there is something else going on in her body.

## 2020-06-17 NOTE — Telephone Encounter (Signed)
Attempted to call patient but the mailbox is full.

## 2020-06-18 ENCOUNTER — Ambulatory Visit (HOSPITAL_COMMUNITY)
Admission: RE | Admit: 2020-06-18 | Discharge: 2020-06-18 | Disposition: A | Discharge status: Home / Self Care | Payer: 59 | Source: Ambulatory Visit | Attending: General Surgery | Admitting: General Surgery

## 2020-06-18 ENCOUNTER — Other Ambulatory Visit: Payer: Self-pay

## 2020-06-18 ENCOUNTER — Telehealth: Payer: Self-pay | Admitting: Internal Medicine

## 2020-06-18 DIAGNOSIS — Z933 Colostomy status: Secondary | ICD-10-CM | POA: Insufficient documentation

## 2020-06-18 DIAGNOSIS — K9400 Colostomy complication, unspecified: Secondary | ICD-10-CM

## 2020-06-18 DIAGNOSIS — B370 Candidal stomatitis: Secondary | ICD-10-CM

## 2020-06-18 MED ORDER — NYSTATIN 100000 UNIT/ML MT SUSP: OROMUCOSAL | 0 refills | Status: DC

## 2020-06-18 MED ORDER — FLUCONAZOLE 100 MG PO TABS: ORAL_TABLET | ORAL | 0 refills | Status: DC

## 2020-06-18 NOTE — Telephone Encounter (Signed)
Refill sent.

## 2020-06-18 NOTE — Discharge Instructions (Signed)
Your skin around the stoma is improving!  Sill leaking at the crease, at 3:00 and 9:00- make sure to seal that area well and use hot pack.  Bleeding appears to be a linear abrasion, most likely a trauma from the barrier ring.  It stops bleeding easily with gently pressure.  Your procedure:  Remove old pouch and cleanse with mild soap and water.  Pat gently dry.  Apply Cera Plus slim barrier ring.  1 ring around entire stoma, conforming to the creasing.  Use a second ring on lower half to increase skin protection and make a larger surface for pouch to adhere to.  You are losing seal when you change position.  Cut pouch opening to 1 inch line.  Apply 1 piece flexible convex pouch Apply ostomy belt.   Apply barrier strips to outside pouching area for additional support on lower half.  Apply hot pack/heating pad/pillow or blanket to promote seal.

## 2020-06-18 NOTE — Telephone Encounter (Signed)
Patient is aware and Rx sent 

## 2020-06-18 NOTE — Telephone Encounter (Signed)
Pt is calling in needing a refill on Rx nystatin (MYCOSTATIN) 100000 UNIT/ML suspension  Pharm: [facility] on [address]

## 2020-06-18 NOTE — Addendum Note (Signed)
Addended by: Westley Hummer B on: 06/18/2020 11:55 AM   Modules accepted: Orders

## 2020-06-18 NOTE — Progress Notes (Signed)
[facility]   Reason for visit:  Ongoing peristomal breakdown.  Improving with Ceraplus barrier ring and pouch.  HPI:  Diverticulitis with perforation.  LMQ colostomy, stoma is 7/8" ROS  Review of Systems  Constitutional: Positive for unexpected weight change.  Gastrointestinal: Positive for abdominal pain.  Skin: Positive for wound.       Peristomal breakdown is improving with implementation of Ceraplus barrier ring and pouch.  Stoma has bleeding noted.  There is a nonintact, linear abrasion consistent with trauma. Gentle pressure stops bleeding.  All other systems reviewed and are negative.  Vital signs:  There were no vitals taken for this visit. Exam:  Physical Exam Constitutional:      Appearance: Normal appearance.  Abdominal:    Skin:      Neurological:     Mental Status: She is alert.     Stoma type/location:  LMQ colostomy Stomal assessment/size:  7/8" stoma, pink and slightly budded Peristomal assessment:  Deep creasing at 3:00 and 9:00  Treatment options for stomal/peristomal skin: Ceraplus SLIM barrier ring around stoma, conforming to crease.  Second barrier ring placed further out from the first layer to increase the surface area for the pouch to stick to. Pouch opening cut to 1 inch.  Stoma is round today, was previously more oval, but only slightly.  Output: thick green stool.  Ostomy pouching: 1pc.flexible convex with 2 slim barrier rings  Education provided:  We perform pouch change and I discuss the advantage of the slim barrier ring. I think stool is getting under the standard size ring when she moves due to deep creasing. We apply new pouch and belt.  Hot pack applied to promote seal.  She is going to surgeon on Monday.   .    Impression/dx  Recessed, small stoma, improving peristomal breakdown.  Discussion  COntinue 1 piece flexible pouch with belt. Switch to ceraplus SLIM barrier ring, 2 rings for more surface area protected.  Apply hot  packs to promote seal.  Belt at all times except shower.  Plan  She will call to let me know how this pouch is doing.  Sent home with 1 box barrier rings (10)     Visit time: 60 minutes.   Domenic Moras FNP-BC

## 2020-06-22 ENCOUNTER — Other Ambulatory Visit: Payer: Self-pay

## 2020-06-23 ENCOUNTER — Ambulatory Visit (INDEPENDENT_AMBULATORY_CARE_PROVIDER_SITE_OTHER): Payer: 59 | Admitting: Internal Medicine

## 2020-06-23 ENCOUNTER — Encounter: Payer: Self-pay | Admitting: Internal Medicine

## 2020-06-23 VITALS — BP 120/80 | HR 83 | Temp 99.2°F | Wt 148.3 lb

## 2020-06-23 DIAGNOSIS — B370 Candidal stomatitis: Secondary | ICD-10-CM

## 2020-06-23 DIAGNOSIS — R739 Hyperglycemia, unspecified: Secondary | ICD-10-CM | POA: Diagnosis not present

## 2020-06-23 DIAGNOSIS — J029 Acute pharyngitis, unspecified: Secondary | ICD-10-CM

## 2020-06-23 LAB — POCT GLYCOSYLATED HEMOGLOBIN (HGB A1C): Hemoglobin A1C: 5.4 % (ref 4.0–5.6)

## 2020-06-23 LAB — POCT RAPID STREP A (OFFICE): Rapid Strep A Screen: NEGATIVE

## 2020-06-23 NOTE — Progress Notes (Signed)
Established Patient Office Visit     This visit occurred during the SARS-CoV-2 public health emergency.  Safety protocols were in place, including screening questions prior to the visit, additional usage of staff PPE, and extensive cleaning of exam room while observing appropriate contact time as indicated for disinfecting solutions.    CC/Reason for Visit: Follow-up thrush  HPI: Amber Klein is a 70 y.o. female who is coming in today for the above mentioned reasons. Past Medical History is significant for: While in [place] she developed a flareup of her diverticulitis, this ended up rupturing require emergent care.  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  She was hospitalized for 33 days.  During this hospitalization she developed a right upper extremity DVT at her PICC line site.  She is currently on Eliquis for this for at least a 23-month period which would end in late August 2022.  In April she was diagnosed with oral thrush and was treated with 7 days of fluconazole.  About 2 weeks later she sent [place] a message about recurrence of her thrush.  She was prescribed 14 days of fluconazole.  She is on day 7 of this course.  She came in today because she feels like the thrush is not yet 100% better, there is some improvement but she has noted some white patches and pustules in the back of her throat.  She is having significant pain with swallowing.   Past Medical/Surgical History: Past Medical History:  Diagnosis Date  . Arthritis   . Cancer ([place])    basal cell skin cancer  . Complication of anesthesia    "I went into shock". Decreased body temperature., WILL ONLY HAVE SURGERY WITH SPINAL ANESTHESIA ONLY!!!  . Dysrhythmia   . Ear infection   . Environmental and seasonal allergies   . Femoral hernia of right side with obstruction 01/18/2018  . GERD (gastroesophageal reflux disease)    no longer having issues takes TUMS occ.  Marland Kitchen History of kidney stones    . Hypotension   . Pneumonia 02/2016   walking pneumonia   . Pre-diabetes   . RBBB   . Sinus infection     Past Surgical History:  Procedure Laterality Date  . CATARACT EXTRACTION    . CESAREAN SECTION    . CO2 LASER APPLICATION N/A 1/61/0960   Procedure: CO2 LASER APPLICATION;  Surgeon: Vanessa Kick, MD;  Location: [facility];  Service: Gynecology;  Laterality: N/A;  . COLONOSCOPY    . COLOSTOMY    . FOOT SURGERY    . INGUINAL HERNIA REPAIR Right 01/18/2018   Procedure: OPEN REPAIR INCARCERATED RIGHT FEMORAL HERNIA WITH MESH;  Surgeon: Fanny Skates, MD;  Location: [facility];  Service: General;  Laterality: Right;  GENERAL / TAP BLOCK  . KNEE SURGERY    . ORIF ELBOW FRACTURE Left 07/04/2014   Procedure: OPEN REDUCTION INTERNAL FIXATION (ORIF) ELBOW/OLECRANON FRACTURE;  Surgeon: Meredith Pel, MD;  Location: [facility];  Service: Orthopedics;  Laterality: Left;  . REFRACTIVE SURGERY    . VULVECTOMY N/A 08/10/2016   Procedure: WIDE EXCISION VULVECTOMY;  Surgeon: Vanessa Kick, MD;  Location: [facility];  Service: Gynecology;  Laterality: N/A;    Social History:  reports that she quit smoking about 5 years ago. Her smoking use included cigarettes. She has a 4.00 pack-year smoking history. She has never used smokeless tobacco. She reports current alcohol use. She reports that she does not use drugs.  Allergies:  Allergies  Allergen Reactions  . Iodinated Diagnostic Agents Anaphylaxis  . Propofol Anaphylaxis  . Shellfish Allergy Anaphylaxis  . Vancomycin Anaphylaxis  . Ciprofloxacin Hives and Swelling    Hives  . Sulfa Antibiotics Swelling and Other (See Comments)    Migraine  . Zithromax [Azithromycin] Hives  . Iodine     UNSPECIFIED REACTION   . Latex Rash    Sensitivity on too long    Family History:     Current Outpatient Medications:  .  albuterol (VENTOLIN HFA) 108 (90 Base) MCG/ACT inhaler, Inhale 2 puffs into the lungs every 6 (six) hours as needed for wheezing or shortness  of breath., Disp: 18 g, Rfl: 2 .  amoxicillin-clavulanate (AUGMENTIN) 875-125 MG tablet, Take 1 tablet by mouth every 12 (twelve) hours., Disp: 14 tablet, Rfl: 0 .  apixaban (ELIQUIS) 5 MG TABS tablet, Take 5 mg by mouth 2 (two) times daily., Disp: , Rfl:  .  Ca Phosphate-Cholecalciferol 250-500 MG-UNIT CHEW, Chew 1 tablet by mouth daily., Disp: , Rfl:  .  Cholecalciferol (VITAMIN D3) 1000 units CAPS, Take 1,000 Units by mouth daily., Disp: , Rfl:  .  estradiol (ESTRACE) 2 MG tablet, Take 1 mg by mouth daily., Disp: , Rfl:  .  ferrous sulfate 325 (65 FE) MG tablet, Take 325 mg by mouth daily with breakfast., Disp: , Rfl:  .  fexofenadine (ALLEGRA) 60 MG tablet, Take 60 mg by mouth 2 (two) times daily., Disp: , Rfl:  .  fexofenadine-pseudoephedrine (ALLEGRA-D) 60-120 MG 12 hr tablet, Take 1 tablet by mouth daily as needed (allergies)., Disp: , Rfl:  .  fluconazole (DIFLUCAN) 100 MG tablet, TAKE 1 TABLET(100 MG) BY MOUTH Daily, Disp: 14 tablet, Rfl: 0 .  HYDROcodone-acetaminophen (NORCO/VICODIN) 5-325 MG tablet, Take 1 tablet by mouth every 6 (six) hours as needed for moderate pain., Disp: 30 tablet, Rfl: 0 .  latanoprost (XALATAN) 0.005 % ophthalmic solution, Place 1 drop into both eyes daily. , Disp: , Rfl:  .  Multiple Vitamin (MULTIVITAMIN WITH MINERALS) TABS tablet, Take 1 tablet by mouth daily. , Disp: , Rfl:  .  Multiple Vitamins-Calcium (VIACTIV MULTI-VITAMIN) CHEW, Chew 1 tablet by mouth daily., Disp: , Rfl:  .  nystatin (MYCOSTATIN) 100000 UNIT/ML suspension, SHAKE LIQUID AND TAKE 5 ML BY MOUTH FOUR TIMES DAILY, Disp: 150 mL, Rfl: 0 .  pantoprazole (PROTONIX) 20 MG tablet, Take 20 mg by mouth daily., Disp: , Rfl:  .  progesterone (PROMETRIUM) 200 MG capsule, Take 200 mgs once daily for 10 days every other month, Disp: , Rfl:   Review of Systems:  Constitutional: Denies fever, chills, diaphoresis, appetite change and fatigue.  HEENT: Denies photophobia, eye pain, redness, hearing loss, ear  pain, congestion, sore throat, rhinorrhea, sneezing,  neck pain, neck stiffness and tinnitus.   Respiratory: Denies SOB, DOE, cough, chest tightness,  and wheezing.   Cardiovascular: Denies chest pain, palpitations and leg swelling.  Gastrointestinal: Denies nausea, vomiting, abdominal pain, diarrhea, constipation, blood in stool and abdominal distention.  Genitourinary: Denies dysuria, urgency, frequency, hematuria, flank pain and difficulty urinating.  Endocrine: Denies: hot or cold intolerance, sweats, changes in hair or nails, polyuria, polydipsia. Musculoskeletal: Denies myalgias, back pain, joint swelling, arthralgias and gait problem.  Skin: Denies pallor, rash and wound.  Neurological: Denies dizziness, seizures, syncope, weakness, light-headedness, numbness and headaches.  Hematological: Denies adenopathy. Easy bruising, personal or family bleeding history  Psychiatric/Behavioral: Denies suicidal ideation, mood changes, confusion, nervousness, sleep disturbance and agitation    Physical Exam:  Vitals:   06/23/20 1553  BP: 120/80  Pulse: 83  Temp: 99.2 F (37.3 C)  TempSrc: Oral  SpO2: 97%  Weight: 148 lb 4.8 oz (67.3 kg)    Body mass index is 25.06 kg/m.   Constitutional: NAD, calm, comfortable Eyes: PERRL, lids and conjunctivae normal, wears corrective lenses ENMT: Mucous membranes are moist. Posterior pharynx is erythematous, no thrush is currently evident, she does have a small pustule to her left soft palate.  Normal dentition.  Neurologic: Grossly intact and nonfocal Psychiatric: Normal judgment and insight. Alert and oriented x 3. Normal mood.    Impression and Plan:  Oral thrush/dysphagia Sore throat  -She has noticed some improvement although not 100%, I have advised her to complete out full 14 days of fluconazole. -We have done a rapid strep test today that has resulted negative. -If no improvement after 14 days, at this point she will have received 21 days  total of fluconazole, suggest referral to GI as she may need EGD, I wonder about strange infections like CMV.    Lelon Frohlich, MD [facility] at [facility]

## 2020-06-25 ENCOUNTER — Other Ambulatory Visit: Payer: Self-pay

## 2020-06-25 ENCOUNTER — Encounter (HOSPITAL_COMMUNITY): Payer: Self-pay

## 2020-06-25 ENCOUNTER — Ambulatory Visit (HOSPITAL_COMMUNITY)
Admission: RE | Admit: 2020-06-25 | Discharge: 2020-06-25 | Disposition: A | Discharge status: Home / Self Care | Payer: 59 | Source: Ambulatory Visit | Attending: Internal Medicine | Admitting: Internal Medicine

## 2020-06-25 DIAGNOSIS — L24A2 Irritant contact dermatitis due to fecal, urinary or dual incontinence: Secondary | ICD-10-CM | POA: Insufficient documentation

## 2020-06-25 DIAGNOSIS — K9403 Colostomy malfunction: Secondary | ICD-10-CM | POA: Diagnosis present

## 2020-06-25 DIAGNOSIS — L24B0 Irritant contact dermatitis related to unspecified stoma or fistula: Secondary | ICD-10-CM

## 2020-06-25 DIAGNOSIS — L24B3 Irritant contact dermatitis related to fecal or urinary stoma or fistula: Secondary | ICD-10-CM | POA: Diagnosis not present

## 2020-06-25 HISTORY — DX: Irritant contact dermatitis related to unspecified stoma or fistula: L24.B0

## 2020-06-25 NOTE — Progress Notes (Signed)
[facility]   Reason for visit:  Ongoing leaks.  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  Contact dermatitis from exposure to stool.  HPI:  Perforation with colostomy ROS  Review of Systems  Constitutional: Positive for chills.  Gastrointestinal: Positive for abdominal pain.       LMQ colostomy in place,  Stoma is retracted and in a deep crease.  The last pouch I applied lasted 2 days.  Patient is able to get a day to 36 hours wear time.  We are hoping to improve this with different pouches/techniques.   Skin: Positive for wound.       Denuded skin to peristomal area.  Tender to touch. Bleeds with cleansing.   Has new nonintact skin to outer perimeter of skin barrier from 3 to 6:00.  States she leaked at 4 AM today and had been soiled for awhile before waking.    All other systems reviewed and are negative.  Vital signs:  There were no vitals taken for this visit. Exam:  Physical Exam Abdominal:    Skin:         Stoma type/location:  LMQ colostomy Stomal assessment/size: 1" retracted stoma Peristomal assessment:  Deep creasing at 3 and 9:00 Treatment options for stomal/peristomal skin: crusted with stoma powder and skin prep x 3.   Output: Thick dark stool.  Is on iron supplements.  Ostomy pouching: 1pc.flexible convex with barrier ring and ostomy belt.  Education provided:  Patient has requested samples of [facility]len pouches  They have not arrived.  I have a box of sample pouches and we agree that the convex pouch may better fit the defect around her stoma.  We will both try and contact [facility] to determine when samples will arrive.     Impression/dx  Contact dermatitis Retracted stoma Discussion  Awaiting arrival of new pouches.  We reinforce pouch change process today.  She is given a new belt, some additional skin prep and barrier rings.   Plan  We will see back once new samples arrive.     Visit time: 75 minutes.   Domenic Moras  FNP-BC

## 2020-06-25 NOTE — Discharge Instructions (Signed)
Contact Dermatitis Dermatitis is redness, soreness, and swelling (inflammation) of the skin. Contact dermatitis is a reaction to something that touches the skin. There are two types of contact dermatitis:  Irritant contact dermatitis. This happens when something bothers (irritates) your skin, like soap.  Allergic contact dermatitis. This is caused when you are exposed to something that you are allergic to, such as poison ivy. What are the causes?  Common causes of irritant contact dermatitis include: ? Makeup. ? Soaps. ? Detergents. ? Bleaches. ? Acids. ? Metals, such as nickel.  Common causes of allergic contact dermatitis include: ? Plants. ? Chemicals. ? Jewelry. ? Latex. ? Medicines. ? Preservatives in products, such as clothing. What increases the risk?  Having a job that exposes you to things that bother your skin.  Having asthma or eczema. What are the signs or symptoms? Symptoms may happen anywhere the irritant has touched your skin. Symptoms include:  Dry or flaky skin.  Redness.  Cracks.  Itching.  Pain or a burning feeling.  Blisters.  Blood or clear fluid draining from skin cracks. With allergic contact dermatitis, swelling may occur. This may happen in places such as the eyelids, mouth, or genitals.   How is this treated?  This condition is treated by checking for the cause of the reaction and protecting your skin. Treatment may also include: ? Steroid creams, ointments, or medicines. ? Antibiotic medicines or other ointments, if you have a skin infection. ? Lotion or medicines to help with itching. ? A bandage (dressing). Follow these instructions at home: Skin care  Moisturize your skin as needed.  Put cool cloths on your skin.  Put a baking soda paste on your skin. Stir water into baking soda until it looks like a paste.  Do not scratch your skin.  Avoid having things rub up against your skin.  Avoid the use of soaps, perfumes, and  dyes. Medicines  Take or apply over-the-counter and prescription medicines only as told by your doctor.  If you were prescribed an antibiotic medicine, take or apply it as told by your doctor. Do not stop using it even if your condition starts to get better. Bathing  Take a bath with: ? Epsom salts. ? Baking soda. ? Colloidal oatmeal.  Bathe less often.  Bathe in warm water. Avoid using hot water. Bandage care  If you were given a bandage, change it as told by your health care provider.  Wash your hands with soap and water before and after you change your bandage. If soap and water are not available, use hand sanitizer. General instructions  Avoid the things that caused your reaction. If you do not know what caused it, keep a journal. Write down: ? What you eat. ? What skin products you use. ? What you drink. ? What you wear in the area that has symptoms. This includes jewelry.  Check the affected areas every day for signs of infection. Check for: ? More redness, swelling, or pain. ? More fluid or blood. ? Warmth. ? Pus or a bad smell.  Keep all follow-up visits as told by your doctor. This is important. Contact a doctor if:  You do not get better with treatment.  Your condition gets worse.  You have signs of infection, such as: ? More swelling. ? Tenderness. ? More redness. ? Soreness. ? Warmth.  You have a fever.  You have new symptoms. Get help right away if:  You have a very bad headache.  You have  neck pain.  Your neck is stiff.  You throw up (vomit).  You feel very sleepy.  You see red streaks coming from the area.  Your bone or joint near the area hurts after the skin has healed.  The area turns darker.  You have trouble breathing. Summary  Dermatitis is redness, soreness, and swelling of the skin.  Symptoms may occur where the irritant has touched you.  Treatment may include medicines and skin care.  If you do not know what caused  your reaction, keep a journal.  Contact a doctor if your condition gets worse or you have signs of infection. This information is not intended to replace advice given to you by your health care provider. Make sure you discuss any questions you have with your health care provider. Document Revised: 05/22/2018 Document Reviewed: 08/15/2017 Elsevier Patient Education  [employer].

## 2020-07-02 ENCOUNTER — Encounter: Payer: Self-pay | Admitting: Internal Medicine

## 2020-07-02 ENCOUNTER — Ambulatory Visit (HOSPITAL_COMMUNITY)
Admission: RE | Admit: 2020-07-02 | Discharge: 2020-07-02 | Disposition: A | Discharge status: Home / Self Care | Payer: 59 | Source: Ambulatory Visit | Attending: General Surgery | Admitting: General Surgery

## 2020-07-02 ENCOUNTER — Other Ambulatory Visit: Payer: Self-pay

## 2020-07-02 DIAGNOSIS — B372 Candidiasis of skin and nail: Secondary | ICD-10-CM | POA: Insufficient documentation

## 2020-07-02 DIAGNOSIS — K219 Gastro-esophageal reflux disease without esophagitis: Secondary | ICD-10-CM

## 2020-07-02 DIAGNOSIS — K9402 Colostomy infection: Secondary | ICD-10-CM | POA: Insufficient documentation

## 2020-07-02 DIAGNOSIS — B370 Candidal stomatitis: Secondary | ICD-10-CM

## 2020-07-02 DIAGNOSIS — L24B1 Irritant contact dermatitis related to digestive stoma or fistula: Secondary | ICD-10-CM

## 2020-07-02 DIAGNOSIS — K5792 Diverticulitis of intestine, part unspecified, without perforation or abscess without bleeding: Secondary | ICD-10-CM

## 2020-07-02 NOTE — Progress Notes (Signed)
[facility]   Reason for visit:  ONgoing pouch leakage.  New pain and bleeding noted in pouch.   HPI:  LMQ colostomy, recessed with deep creasing and frequent leaks.   ROS  Review of Systems Vital signs:  BP 112/79 (BP Location: Left Arm)   Pulse 75   Temp 98.9 F (37.2 C) (Oral)   Resp 17   Ht 5' 4.5" (1.638 m)   Wt 65.3 kg   SpO2 96%   BMI 24.34 kg/m  Exam:  Physical Exam  Stoma type/location:  LMQ ostomy  0.3 cm in diameter, located in deep creasing.  Our last pouch lasted 5 days. Since then, she has been getting 2 days wear time. She is being treated for thrush that has not cleared up since she was inpatient.  She states her PCP is going to schedule a GI consult.  Stomal assessment/size:  See above  Pink patent and producing stool, recessed, small in diameter Peristomal assessment:  Denuded skin to creasing and peristomal skin, extends 3 cm circumferentially and is very tender to touch. Satellite lesions noted along the periphery, consistent with fungal overgrowth Treatment options for stomal/peristomal skin: COntinue same pouching.  Needs consult to GI to get control over candidiasis.   Output: soft brown stool in pouch, no blood noted today.  Barrier ring had blood on it in areas of denuded skin.  Ostomy pouching: 1pc.convex, powder skin prep and belt.  Education provided:  Discussed that the breakdown is now more related to candidiasis than the exposure to stool as she has a longer wear time.  She will follow up with PCP for referrals mentioned.     Impression/dx  Candidiasis  To peristomal skin with breakdown noted.  Discussion   see back as needed to call office.  Plan  Call office as needed.     Visit time: 45 minutes.   Domenic Moras FNP-BC

## 2020-07-05 NOTE — Discharge Instructions (Signed)
No changes in pouching.  Use barrier strips to outside perimeter of pouch Apply gentle heat to pouch after application.  Follow up with PCP for GI referral and pain management

## 2020-07-05 NOTE — Progress Notes (Signed)
[facility]   Reason for visit:  Patient notes increased blood in stool.  IS currently being treated for a flare of thrush. Cera plus products were improving skin, but candidal overgrowth is noted today. She is on fluconazole  Pouch does not adhere with antifungal powder HPI:  Thrush, chronic unresolved ROS  Review of Systems  Gastrointestinal: Positive for blood in stool.       Blood noted in pouch is due to peristomal breakdown and bleeding.   Skin: Positive for wound.       Midline abdominal wound, healing nicely PEristomal breakdown, including fungal overgrowth is present.    Vital signs:  BP 112/79 (BP Location: Left Arm)   Pulse 75   Temp 98.9 F (37.2 C) (Oral)   Resp 17   Ht 5' 4.5" (1.638 m)   Wt 65.3 kg   SpO2 96%   BMI 24.34 kg/m  Exam:  Physical Exam Vitals reviewed.  Constitutional:      Appearance: Normal appearance.  Abdominal:     Palpations: Abdomen is soft.     Tenderness: There is abdominal tenderness.    Skin:    General: Skin is warm.     Findings: Wound present.          Stoma type/location:  LLQ colostomy, retracted Stomal assessment/size:  0.3 cm stoma visible. Located in deep creasing Peristomal assessment:  erythematous and denuded, bleeds with pouch change. Satellite lesions to peristomal skin prep Tender to touch Treatment options for stomal/peristomal skin: Crusting x 3 with powder and skin prep  Barrier ring folded and molded into creasing. Flexible convex pouch with belt  Barrier strips to perimeter.  When pouch is removed, blood is present on barrier ring, the bleeding is from skin breakdown.  Output: soft brown stool Ostomy pouching: 1pc. With barrier ring and belt.  Skin is crusted with powder and skin prep.  Education provided:  A photo is taken and downloaded into the chart today.  (Media tab) Patient is going to reach out to her PCP for GI Referral- this was suggested by PCP at the last visit and additional pain  medication.      Impression/dx  Candidiasis to peristomal skin, impacting pouch adherence and wear time.  Discussion  Crust skin, apply pouch, warm with heating pad to promote seal.  Plan  See back as needed. Follow up with PCP and initiate GI referral.     Visit time: 40 minutes.   Domenic Moras FNP-BC

## 2020-07-06 ENCOUNTER — Encounter: Payer: Self-pay | Admitting: Internal Medicine

## 2020-07-06 ENCOUNTER — Other Ambulatory Visit: Payer: Self-pay

## 2020-07-06 DIAGNOSIS — R1110 Vomiting, unspecified: Secondary | ICD-10-CM | POA: Insufficient documentation

## 2020-07-06 DIAGNOSIS — B370 Candidal stomatitis: Secondary | ICD-10-CM

## 2020-07-06 DIAGNOSIS — R0902 Hypoxemia: Secondary | ICD-10-CM | POA: Insufficient documentation

## 2020-07-07 MED ORDER — NYSTATIN 100000 UNIT/ML MT SUSP: OROMUCOSAL | 0 refills | Status: DC

## 2020-07-16 ENCOUNTER — Other Ambulatory Visit (INDEPENDENT_AMBULATORY_CARE_PROVIDER_SITE_OTHER): Payer: 59

## 2020-07-16 ENCOUNTER — Encounter: Payer: Self-pay | Admitting: Nurse Practitioner

## 2020-07-16 ENCOUNTER — Ambulatory Visit (INDEPENDENT_AMBULATORY_CARE_PROVIDER_SITE_OTHER): Payer: 59 | Admitting: Nurse Practitioner

## 2020-07-16 VITALS — BP 112/78 | HR 80 | Ht 64.5 in | Wt 154.5 lb

## 2020-07-16 DIAGNOSIS — K5792 Diverticulitis of intestine, part unspecified, without perforation or abscess without bleeding: Secondary | ICD-10-CM

## 2020-07-16 DIAGNOSIS — Z933 Colostomy status: Secondary | ICD-10-CM | POA: Diagnosis not present

## 2020-07-16 DIAGNOSIS — B370 Candidal stomatitis: Secondary | ICD-10-CM

## 2020-07-16 LAB — CBC WITH DIFFERENTIAL/PLATELET
Basophils Absolute: 0.1 10*3/uL (ref 0.0–0.1)
Basophils Relative: 0.9 % (ref 0.0–3.0)
Eosinophils Absolute: 0.3 10*3/uL (ref 0.0–0.7)
Eosinophils Relative: 4 % (ref 0.0–5.0)
HCT: 37.2 % (ref 36.0–46.0)
Hemoglobin: 12.2 g/dL (ref 12.0–15.0)
Lymphocytes Relative: 36 % (ref 12.0–46.0)
Lymphs Abs: 2.4 10*3/uL (ref 0.7–4.0)
MCHC: 32.8 g/dL (ref 30.0–36.0)
MCV: 86.4 fl (ref 78.0–100.0)
Monocytes Absolute: 0.6 10*3/uL (ref 0.1–1.0)
Monocytes Relative: 9 % (ref 3.0–12.0)
Neutro Abs: 3.3 10*3/uL (ref 1.4–7.7)
Neutrophils Relative %: 50.1 % (ref 43.0–77.0)
Platelets: 387 10*3/uL (ref 150.0–400.0)
RBC: 4.3 Mil/uL (ref 3.87–5.11)
RDW: 14.9 % (ref 11.5–15.5)
WBC: 6.6 10*3/uL (ref 4.0–10.5)

## 2020-07-16 MED ORDER — FLUCONAZOLE 200 MG PO TABS
ORAL_TABLET | ORAL | 0 refills | Status: DC
Start: 2020-07-16 — End: 2020-10-11

## 2020-07-16 NOTE — Progress Notes (Signed)
07/16/2020 Harlei Lehrmann [id number] Jul 25, 1950   CHIEF COMPLAINT:  Oral thrush, pain and tightness in throat when swallowing   HISTORY OF PRESENT ILLNESS:  Freada Bergeron 70 year old female with a past medical history of arthritis, kidney stones and diverticulitis 2017.  She developed severe abdominal pain while she was in [place], [place]. She presented to the local hospital and she was diagnosed with acute sigmoid diverticulitis with a perforation.  She went into septic shock, she was intubated and was admitted to the ICU. She underwent a sigmoid resection with the placement of a colostomy (Hartmann procedure) on 04/10/2020.  She developed a RUE DVT at the PICC line site which required Eliquis x 6 months.  She was treated with numerous antibiotics throughout her hospitalization course. She developed oral thrush which was treated with Fluconazole po. She was eventually discharged home on 05/05/2020.  She developed drainage from her ostomy surgical site and she was prescribed Augmentin for 7 to 10 days on 05/09/2020.  She then had recurrence of oral thrush in early April and she was prescribed Fluconazole 100 mg daily for 7 days by her PCP.  Within 3 to 4 days off of Fluconazole, her thrush recurred and she was prescribed Fluconazole 100 mg daily for 14 days.  She presents to our office today as referred by Dr. Isaac Bliss for further evaluation regarding recurrent oral thrush with odynophagia.  She complains of having upper throat pain with tightness when she swallows any food, liquids or tablets. She has received Fluconazole 100mg  one tab po daily for 7 days then an additional 14 days. Last week, the white coating on her tongue with white patches in the back of her throat recurred and she was prescribed Nystatin 5 mL 4 times patient spit 4 times daily for 7 days, last dose was completed 2 days ago.  Food, liquids or pills do not get stuck in the throat or esophagus and do not  pass down slowly.  She describes having upper throat pain and tightness when she swallows her own saliva, liquids, food and tablets.  She awakens in the morning and she feels she has cotton balls in the back of her mouth.  These symptoms temporarily resolve after she takes Diflucan or Nystatin but recur within 3 days of stopping these medications.  She is able to manage her secretions without any difficulty.  She is not currently on any antibiotic.  No history of diabetes or HIV.  She has mild tenderness to the stoma, no tenderness to the external stoma border.  She is passing a moderate amount of soft brown stool daily.  No obvious blood from the colostomy or black stool.  Fever, sweats or chills.  No weight loss, she has gained  8 pounds over the past few months. She reported undergoing a colonoscopy by Dr. Collene Mares in 2016 which showed diverticulosis without any polyps.  She was advised to repeat a colonoscopy in 10 years.  She was evaluated by colorectal surgeon Dr. Leighton Ruff on 8/33/8250, at that time her midline incision was nearly healed without evidence of infection and her colostomy was functioning well.  He was seen approximately 4 weeks later and her incision was well-healed at that point.  There was discussion about reversing her colostomy in 6 to 12 months.  She will possibly require a colonoscopy prior to her colostomy reversal surgery.  She is scheduled to see Dr. Marcello Moores in the next few weeks for further follow-up.  She is followed by the ostomy clinic as her stoma retracted a bit and she required new colostomy appliances which are currently working efficiently.  Past Medical History:  Diagnosis Date  . Arthritis   . Cancer ([place])    basal cell skin cancer  . Complication of anesthesia    "I went into shock". Decreased body temperature., WILL ONLY HAVE SURGERY WITH SPINAL ANESTHESIA ONLY!!!  . Dysrhythmia   . Ear infection   . Environmental and seasonal allergies   . Femoral hernia of  right side with obstruction 01/18/2018  . GERD (gastroesophageal reflux disease)    no longer having issues takes TUMS occ.  Marland Kitchen History of kidney stones   . Hypotension   . Irritant contact dermatitis associated with stoma 06/25/2020  . Pneumonia 02/2016   walking pneumonia   . Pre-diabetes   . RBBB   . Sinus infection    Past Surgical History:  Procedure Laterality Date  . CATARACT EXTRACTION    . CESAREAN SECTION    . CO2 LASER APPLICATION N/A 04/06/9796   Procedure: CO2 LASER APPLICATION;  Surgeon: Vanessa Kick, MD;  Location: [facility];  Service: Gynecology;  Laterality: N/A;  . COLON SURGERY    . COLONOSCOPY    . COLOSTOMY    . FOOT SURGERY    . INGUINAL HERNIA REPAIR Right 01/18/2018   Procedure: OPEN REPAIR INCARCERATED RIGHT FEMORAL HERNIA WITH MESH;  Surgeon: Fanny Skates, MD;  Location: [facility];  Service: General;  Laterality: Right;  GENERAL / TAP BLOCK  . KNEE SURGERY    . ORIF ELBOW FRACTURE Left 07/04/2014   Procedure: OPEN REDUCTION INTERNAL FIXATION (ORIF) ELBOW/OLECRANON FRACTURE;  Surgeon: Meredith Pel, MD;  Location: [facility];  Service: Orthopedics;  Laterality: Left;  . REFRACTIVE SURGERY    . VULVECTOMY N/A 08/10/2016   Procedure: WIDE EXCISION VULVECTOMY;  Surgeon: Vanessa Kick, MD;  Location: [facility];  Service: Gynecology;  Laterality: N/A;   Social History: She is married.  She has 2 sons.  She is employed by [employer].  She quit smoking cigarettes 5 years ago.  No alcohol use.  No drug use.  Family History: Father with history of kidney cancer.  Maternal grandmother with history of breast cancer.  No family history of esophageal, gastric or colon cancer.  Allergies  Allergen Reactions  . Iodinated Diagnostic Agents Anaphylaxis  . Propofol Anaphylaxis  . Shellfish Allergy Anaphylaxis  . Vancomycin Anaphylaxis  . Ciprofloxacin Hives and Swelling    Hives  . Sulfa Antibiotics Swelling and Other (See Comments)    Migraine  . Zithromax  [Azithromycin] Hives  . Iodine     UNSPECIFIED REACTION   . Latex Rash    Sensitivity on too long      Outpatient Encounter Medications as of 07/16/2020  Medication Sig  . albuterol (VENTOLIN HFA) 108 (90 Base) MCG/ACT inhaler Inhale 2 puffs into the lungs every 6 (six) hours as needed for wheezing or shortness of breath.  Marland Kitchen apixaban (ELIQUIS) 5 MG TABS tablet Take 5 mg by mouth 2 (two) times daily.  . Ca Phosphate-Cholecalciferol 250-500 MG-UNIT CHEW Chew 1 tablet by mouth daily.  . Cholecalciferol (VITAMIN D3) 1000 units CAPS Take 1,000 Units by mouth daily.  . ferrous sulfate 325 (65 FE) MG tablet Take 325 mg by mouth daily with breakfast.  . fexofenadine-pseudoephedrine (ALLEGRA-D) 60-120 MG 12 hr tablet Take 1 tablet by mouth daily as needed (allergies).  . latanoprost (XALATAN) 0.005 % ophthalmic solution  Place 1 drop into both eyes daily.   . Multiple Vitamin (MULTIVITAMIN WITH MINERALS) TABS tablet Take 1 tablet by mouth daily.   . pantoprazole (PROTONIX) 40 MG tablet Take 1 tablet by mouth daily.  . [DISCONTINUED] amoxicillin-clavulanate (AUGMENTIN) 875-125 MG tablet Take 1 tablet by mouth every 12 (twelve) hours. (Patient not taking: Reported on 07/16/2020)  . [DISCONTINUED] estradiol (ESTRACE) 2 MG tablet Take 1 mg by mouth daily. (Patient not taking: Reported on 07/16/2020)  . [DISCONTINUED] fexofenadine (ALLEGRA) 60 MG tablet Take 60 mg by mouth 2 (two) times daily. (Patient not taking: Reported on 07/16/2020)  . [DISCONTINUED] fluconazole (DIFLUCAN) 100 MG tablet TAKE 1 TABLET(100 MG) BY MOUTH Daily (Patient not taking: Reported on 07/16/2020)  . [DISCONTINUED] HYDROcodone-acetaminophen (NORCO/VICODIN) 5-325 MG tablet Take 1 tablet by mouth every 6 (six) hours as needed for moderate pain. (Patient not taking: Reported on 07/16/2020)  . [DISCONTINUED] Multiple Vitamins-Calcium (VIACTIV MULTI-VITAMIN) CHEW Chew 1 tablet by mouth daily. (Patient not taking: Reported on 07/16/2020)  .  [DISCONTINUED] nystatin (MYCOSTATIN) 100000 UNIT/ML suspension SHAKE LIQUID AND TAKE 5 ML BY MOUTH FOUR TIMES DAILY (Patient not taking: Reported on 07/16/2020)  . [DISCONTINUED] progesterone (PROMETRIUM) 200 MG capsule Take 200 mgs once daily for 10 days every other month (Patient not taking: Reported on 07/16/2020)   No facility-administered encounter medications on file as of 07/16/2020.    REVIEW OF SYSTEMS: All other systems reviewed and negative except where noted in the History of Present Illness.  PHYSICAL EXAM: BP 112/78 (BP Location: Left Arm, Patient Position: Sitting, Cuff Size: Normal)   Pulse 80   Ht 5' 4.5" (1.638 m)   Wt 154 lb 8 oz (70.1 kg)   SpO2 96%   BMI 26.11 kg/m  General: 70 year old female in no acute distress. Head: Normocephalic and atraumatic. Eyes:  Sclerae non-icteric, conjunctive pink. Ears: Normal auditory acuity. Mouth: Dentition intact.  Few white patches to the left posterior pharynx, mild whitish coating on tongue.  Upper palate intact without plaques or erythema. Neck: Supple, no lymphadenopathy or thyromegaly.  Lungs: Clear bilaterally to auscultation without wheezes, crackles or rhonchi. Heart: Regular rate and rhythm. No murmur, rub or gallop appreciated.  Abdomen: Soft, nontender, non distended. No masses. No hepatosplenomegaly. Normoactive bowel sounds x 4 quadrants.  Left mid abdominal colostomy site intact, small amount of brown soft stool in the ostomy bag.  Midline abdominal scar intact. Rectal: Deferred. Musculoskeletal: Symmetrical with no gross deformities. Skin: Warm and dry. No rash or lesions on visible extremities. Extremities: No edema. Neurological: Alert oriented x 4, no focal deficits.  Psychological:  Alert and cooperative. Normal mood and affect.  ASSESSMENT AND PLAN:  56.  70 year old female with recurrent oral thrush with odynophagia. Oral thrush secondary to extensive IV antibiotics during her 33 day hospital admission for  perforated diverticulitis s/p Hartmann's procedure and oral antibiotics post hospital discharge.  No dysphagia.  No heartburn. -Diflucan 200 mg 1 p.o. daily for 14 days, patient to call me on Monday 6/6 with an update -Consider ENT evaluation for oral/pharynx cultures and laryngoscopy if odynophagia and throat tightness persists -Soft foods, diet as tolerated -CBC and CMP  2. S/P Hartmann procedure 04/10/2020 secondary to acute sigmoid diverticulitis with perforation.  -Eventual colonoscopy prior to colostomy reversal -Await further recommendations from Dr. Leighton Ruff   3. RUE DVT on Eliquis until 09/2020    CC:  Isaac Bliss, [place]*

## 2020-07-16 NOTE — Patient Instructions (Addendum)
LABS:  Lab work has been ordered for you today. Our lab is located in the basement. Press "B" on the elevator. The lab is located at the first door on the left as you exit the elevator.  HEALTHCARE LAWS AND MY CHART RESULTS: Due to recent changes in healthcare laws, you may see the results of your imaging and laboratory studies on MyChart before your provider has had a chance to review them.   We understand that in some cases there may be results that are confusing or concerning to you. Not all laboratory results come back in the same time frame and the provider may be waiting for multiple results in order to interpret others.  Please give [place] 48 hours in order for your provider to thoroughly review all the results before contacting the office for clarification of your results.   MEDICATION: We have sent the following medication to your pharmacy for you to pick up at your convenience: Diflucan 200 MG tablet. Take 1 tablet daily for 14 days.  RECOMMENDATIONS: Hardin Negus Bacteria Probiotic once a day for 3-4 weeks. ENT evaluation if your symptoms worsen. Eventual colonoscopy prior to colostomy reversal. Call or send a [place] message on Monday with an update.  It was great seeing you today! Thank you for entrusting me with your care and choosing [facility].  Noralyn Pick, CRNP  The [facility] providers would like to encourage you to use [facility] to communicate with providers for non-urgent requests or questions.  Due to long hold times on the telephone, sending your provider a message by [facility] may be faster and more efficient way to get a response. Please allow 48 business hours for a response.  Please remember that this is for non-urgent requests/questions.

## 2020-07-19 LAB — COMPREHENSIVE METABOLIC PANEL
ALT: 19 U/L (ref 0–35)
AST: 20 U/L (ref 0–37)
Albumin: 3.9 g/dL (ref 3.5–5.2)
Alkaline Phosphatase: 93 U/L (ref 39–117)
BUN: 9 mg/dL (ref 6–23)
CO2: 27 mEq/L (ref 19–32)
Calcium: 9.6 mg/dL (ref 8.4–10.5)
Chloride: 101 mEq/L (ref 96–112)
Creatinine, Ser: 0.7 mg/dL (ref 0.40–1.20)
GFR: 89.17 mL/min (ref >60.00)
Glucose, Bld: 88 mg/dL (ref 70–99)
Potassium: 4 mEq/L (ref 3.5–5.1)
Sodium: 138 mEq/L (ref 135–145)
Total Bilirubin: 0.2 mg/dL (ref 0.2–1.2)
Total Protein: 6.9 g/dL (ref 6.0–8.3)

## 2020-07-19 NOTE — Progress Notes (Signed)
I agree with the above note, plan 

## 2020-07-26 ENCOUNTER — Other Ambulatory Visit: Payer: Self-pay

## 2020-07-26 ENCOUNTER — Ambulatory Visit (HOSPITAL_COMMUNITY)
Admission: RE | Admit: 2020-07-26 | Discharge: 2020-07-26 | Disposition: A | Discharge status: Home / Self Care | Payer: 59 | Source: Ambulatory Visit | Attending: Internal Medicine | Admitting: Internal Medicine

## 2020-07-26 DIAGNOSIS — L24B1 Irritant contact dermatitis related to digestive stoma or fistula: Secondary | ICD-10-CM

## 2020-07-26 DIAGNOSIS — K9400 Colostomy complication, unspecified: Secondary | ICD-10-CM | POA: Diagnosis not present

## 2020-07-26 DIAGNOSIS — B377 Candidal sepsis: Secondary | ICD-10-CM | POA: Diagnosis not present

## 2020-07-26 DIAGNOSIS — Z433 Encounter for attention to colostomy: Secondary | ICD-10-CM | POA: Insufficient documentation

## 2020-07-26 NOTE — Discharge Instructions (Signed)
See back in one month Change pouch every 5 days. Can crust skin with tap water is no sting prep is too painful.

## 2020-07-26 NOTE — Progress Notes (Signed)
[facility]   Reason for visit:  New pouching system implemented (coloplast) and needs ongoing teaching.  Pouch is leaking on arrival today.  HPI:  Has been experiencing leaks, wear time of 1-2 days.  We found a flexible convex pouch that works 4-5 days.  Coloplast soft convex with belt and barrier ring.   ROS  Review of Systems Vital signs:  BP 132/79   Pulse 66   Temp 99 F (37.2 C) (Oral)   Resp 18   SpO2 97%  Exam:  Physical Exam  Stoma type/location:  LMQ pinpoint (3/8") stoma located in deep concavity (photo in chart) denuded skin circumferentially.  When leaks occur, stool sits in this concave area.   Stomal assessment/size:  3/8" pinpoint opening Peristomal assessment:  incontinence associated skin damage (IAD) from stool leaks and fungal overgrowth chronically.  No satellite lesions noted today.  Treatment options for stomal/peristomal skin: barrier ring, coloplast pouch and belt. Has not been using barrier strips, stating she has not needed them.   Output: soft brown stool Ostomy pouching: 1pc.soft convex pouch with barrier ring and belt.  Education provided:  We perform pouch change, I crust the skin with no sting skin prep and powder.  Apply barrier ring and cut pouch to fit (5/8) opening.     Impression/dx  Contact dermatitis Discussion  Continue coloplast pouches.  Change every 5 days and if she feels burning or itching under barrier as this usually indicates leakage Plan  See back in one month. Has upcoming appointment with ENT regarding fungal overgrowth. Has been on fluconazole for an extended period of time with little improvement.     Visit time: 45 minutes.   Domenic Moras FNP-BC

## 2020-10-05 NOTE — Telephone Encounter (Signed)
Lauren, please contact patient and schedule her for an office appointment with me to review the colonoscopy procedure, benefits/risks and we will get her scheduled for a colonoscopy then and will review the bowel prep. Thx

## 2020-10-06 ENCOUNTER — Telehealth: Payer: Self-pay | Admitting: *Deleted

## 2020-10-06 NOTE — Telephone Encounter (Signed)
ATC patient, phone rings then goes to busy signal. Will attempt call again at another time.

## 2020-10-06 NOTE — Telephone Encounter (Signed)
Patient needs to be scheduled for OV and colonoscopy while at OV

## 2020-10-08 DIAGNOSIS — K5730 Diverticulosis of large intestine without perforation or abscess without bleeding: Secondary | ICD-10-CM | POA: Insufficient documentation

## 2020-10-08 NOTE — Telephone Encounter (Signed)
Pt scheduled to see Carl Best NP 10/11/20 at 2pm. Pt will be scheduled for colon at [place]. Pt aware.

## 2020-10-08 NOTE — Telephone Encounter (Signed)
Is APP clinic appt sooner? She can certainly try Dr. Kemper Durie

## 2020-10-08 NOTE — Telephone Encounter (Signed)
Spoke with patient. Patient has ostomy. First available appt is 10/10. Patient does not want to wait that long as she supposed to surgery for reversal.Per patient surgery wants her to have colon first but considering OV is pushed out and Dr Lyndel Safe wanted OV and colon she feels that it too long to wait. Patient states her last colon was from Dr Collene Mares and she wonders if that would be quicker to get in. Please Advise, thank you

## 2020-10-11 ENCOUNTER — Ambulatory Visit (INDEPENDENT_AMBULATORY_CARE_PROVIDER_SITE_OTHER): Payer: 59 | Admitting: Nurse Practitioner

## 2020-10-11 ENCOUNTER — Encounter: Payer: Self-pay | Admitting: Nurse Practitioner

## 2020-10-11 VITALS — BP 110/60 | HR 93 | Ht 64.5 in | Wt 164.0 lb

## 2020-10-11 DIAGNOSIS — Z933 Colostomy status: Secondary | ICD-10-CM

## 2020-10-11 DIAGNOSIS — Z1211 Encounter for screening for malignant neoplasm of colon: Secondary | ICD-10-CM

## 2020-10-11 NOTE — Patient Instructions (Signed)
If you are age 68 or older, your body mass index should be between 23-30. Your Body mass index is 27.72 kg/m. If this is out of the aforementioned range listed, please consider follow up with your Primary Care Provider.  The [facility] providers would like to encourage you to use [facility] to communicate with providers for non-urgent requests or questions.  Due to long hold times on the telephone, sending your provider a message by [facility] may be faster and more efficient way to get a response. Please allow 48 business hours for a response.  Please remember that this is for non-urgent requests/questions.  Our office will contact you with a hospital date for the colonoscopy once Dr. Ardis Hughs has reviewed your chart.  It was great seeing you today! Thank you for entrusting me with your care and choosing [facility].  Noralyn Pick, CRNP

## 2020-10-11 NOTE — Progress Notes (Signed)
10/12/2020 [name] [id number] [birth year]   Chief Complaint: Schedule a colonoscopy prior to colostomy reversal surgery  History of Present Illness: Amber Klein 70 year old female with a past medical history of arthritis, kidney stones and diverticulitis 2017.    I initially saw Amber Klein in the office on 07/16/2020 following her hospitalization secondary to acute sigmoid diverticulitis with a perforation and recurrent oral thrush.  As previously reviewed, she developed severe abdominal pain while she was in [place], [place]. She presented to the local hospital and she was diagnosed with acute sigmoid diverticulitis with a perforation.  She went into septic shock, she was intubated and was admitted to the ICU. She underwent a sigmoid resection with the placement of a colostomy (Hartmann procedure) on 04/10/2020.  She developed a RUE DVT at the PICC line site which required Eliquis x 6 months.  She was treated with numerous antibiotics throughout her hospitalization course. She developed oral thrush which was treated with Fluconazole po. She was eventually discharged home on 05/05/2020.  She developed drainage from her ostomy surgical site and she was prescribed Augmentin for 7 to 10 days on 05/09/2020.  She then had recurrence of oral thrush in early April and she was prescribed Fluconazole 100 mg daily for 7 days by her PCP.  Within 3 to 4 days off of Fluconazole, her thrush recurred and she was prescribed Fluconazole 100 mg daily for 14 days.  At the time of her office appointment 07/16/2020 I prescribed Diflucan 200 mg daily for 2 weeks and her oral thrush abated.  She continues to follow-up with colorectal surgeon Dr. Leighton Ruff status post sigmoid resection and colostomy placement 03/2020.  She is hoping to undergo colostomy reversal surgery within the next few months as she continues to have discomfort around the ostomy site as her stoma is retracted and  she has difficulty the colostomy appliances which causes bleeding from the stoma site.  Reported the diameter of her stoma is 3/8ths of an inch.  She was advised by Dr. Marcello Moores to undergo a colonoscopy prior to scheduling a colostomy reversal surgery.  She reported undergoing a colonoscopy by Dr. Collene Mares in 2016 which showed diverticulosis, however, she reported having hypotension during the colonoscopy with the administration of Propofol.  I requested a copy of her 2016 colonoscopy report for further review.  She also reported having hypotension, chills and prolonged sedation with Propofol which was used during her left knee meniscus surgery in 2017.  Overall, she is feeling quite well.  Her appetite is good.  She denies having any nausea or vomiting.  She is mild abdominal discomfort near the colostomy site without significant abdominal pain.  She is passing brown watery to muddy loose stools daily.  No firm stools.  She was on Eliquis due to having a RUE DVT during her hospitalization.  Her Eliquis was discontinued 1 week ago.  CBC Latest Ref Rng & Units 07/16/2020 05/21/2020 05/09/2020  WBC 4.0 - 10.5 K/uL 6.6 6.6 7.8  Hemoglobin 12.0 - 15.0 g/dL 12.2 11.2(L) 10.0(L)  Hematocrit 36.0 - 46.0 % 37.2 34.2(L) 32.4(L)  Platelets 150.0 - 400.0 K/uL 387.0 585.0(H) 545(H)     CMP Latest Ref Rng & Units 07/16/2020 05/21/2020 05/09/2020  Glucose 70 - 99 mg/dL 88 82 92  BUN 6 - 23 mg/dL '9 7 10  '$ Creatinine 0.40 - 1.20 mg/dL 0.70 0.58 0.60  Sodium 135 - 145 mEq/L 138 135 137  Potassium 3.5 -  5.1 mEq/L 4.0 3.7 3.5  Chloride 96 - 112 mEq/L 101 98 100  CO2 19 - 32 mEq/L 27 32 30  Calcium 8.4 - 10.5 mg/dL 9.6 9.7 9.0  Total Protein 6.0 - 8.3 g/dL 6.9 7.0 -  Total Bilirubin 0.2 - 1.2 mg/dL 0.2 0.3 -  Alkaline Phos 39 - 117 U/L 93 80 -  AST 0 - 37 U/L 20 18 -  ALT 0 - 35 U/L 19 14 -    Past Medical History:  Diagnosis Date   Arthritis    Cancer ([place])    basal cell skin cancer   Complication of anesthesia    "I went  into shock". Decreased body temperature., WILL ONLY HAVE SURGERY WITH SPINAL ANESTHESIA ONLY!!!   Dysrhythmia    Ear infection    Environmental and seasonal allergies    Femoral hernia of right side with obstruction 01/18/2018   GERD (gastroesophageal reflux disease)    no longer having issues takes TUMS occ.   History of kidney stones    Hypotension    Irritant contact dermatitis associated with stoma 06/25/2020   Pneumonia 02/2016   walking pneumonia    Pre-diabetes    RBBB    Sinus infection     Past Surgical History:  Procedure Laterality Date   CATARACT EXTRACTION     CESAREAN SECTION     CO2 LASER APPLICATION N/A 0000000   Procedure: CO2 LASER APPLICATION;  Surgeon: Vanessa Kick, MD;  Location: [facility];  Service: Gynecology;  Laterality: N/A;   COLON SURGERY     COLONOSCOPY     COLOSTOMY     FOOT SURGERY     INGUINAL HERNIA REPAIR Right 01/18/2018   Procedure: OPEN REPAIR INCARCERATED RIGHT FEMORAL HERNIA WITH MESH;  Surgeon: Fanny Skates, MD;  Location: [place];  Service: General;  Laterality: Right;  GENERAL / TAP BLOCK   KNEE SURGERY     ORIF ELBOW FRACTURE Left 07/04/2014   Procedure: OPEN REDUCTION INTERNAL FIXATION (ORIF) ELBOW/OLECRANON FRACTURE;  Surgeon: Meredith Pel, MD;  Location: [facility];  Service: Orthopedics;  Laterality: Left;   REFRACTIVE SURGERY     VULVECTOMY N/A 08/10/2016   Procedure: WIDE EXCISION VULVECTOMY;  Surgeon: Vanessa Kick, MD;  Location: [facility];  Service: Gynecology;  Laterality: N/A;     Current Outpatient Medications on File Prior to Visit  Medication Sig Dispense Refill   albuterol (VENTOLIN HFA) 108 (90 Base) MCG/ACT inhaler Inhale 2 puffs into the lungs every 6 (six) hours as needed for wheezing or shortness of breath. 18 g 2   Ca Phosphate-Cholecalciferol 250-500 MG-UNIT CHEW Chew 1 tablet by mouth daily.     Cholecalciferol (VITAMIN D3) 1000 units CAPS Take 1,000 Units by mouth daily.     ferrous sulfate 325 (65 FE) MG tablet  Take 325 mg by mouth every other day.     fexofenadine-pseudoephedrine (ALLEGRA-D) 60-120 MG 12 hr tablet Take 1 tablet by mouth daily as needed (allergies).     latanoprost (XALATAN) 0.005 % ophthalmic solution Place 1 drop into both eyes daily.      Multiple Vitamin (MULTIVITAMIN WITH MINERALS) TABS tablet Take 1 tablet by mouth daily.      No current facility-administered medications on file prior to visit.   Allergies  Allergen Reactions   Iodinated Diagnostic Agents Anaphylaxis   Propofol Anaphylaxis    Couldn't wake patient up   Shellfish Allergy Anaphylaxis   Vancomycin Anaphylaxis   Ciprofloxacin Hives and Swelling  Hives   Sulfa Antibiotics Swelling and Other (See Comments)    Migraine   Zithromax [Azithromycin] Hives   Iodine     UNSPECIFIED REACTION    Latex Rash    Sensitivity on too long      Current Medications, Allergies, Past Medical History, Past Surgical History, Family History and Social History were reviewed in [employer] record.   Review of Systems:   Constitutional: Negative for fever, sweats, chills or weight loss.  Respiratory: Negative for shortness of breath.   Cardiovascular: Negative for chest pain, palpitations and leg swelling.  Gastrointestinal: See HPI.  Musculoskeletal: Negative for back pain or muscle aches.  Neurological: Negative for dizziness, headaches or paresthesias.    Physical Exam: BP 110/60   Pulse 93   Ht 5' 4.5" (1.638 m)   Wt 164 lb (74.4 kg)   BMI 27.72 kg/m  General: 70 year old female in no acute distress. Head: Normocephalic and atraumatic. Eyes: No scleral icterus. Conjunctiva pink . Ears: Normal auditory acuity. Mouth: Dentition intact. No ulcers or lesions.  No further evidence of oral thrush. Lungs: Clear throughout to auscultation. Heart: Regular rate and rhythm, no murmur. Abdomen: Soft, nontender and nondistended. No masses or hepatomegaly. Normal bowel sounds x 4 quadrants.   Surrounding colostomy site intact without tenderness.  Liquid brown stool in colostomy bag.  Brown stool matter covered stoma opening.  Patient was unable to remove appliance bag for inspection and she did not have equipment to apply a clean appliance. Rectal: Deferred.  Musculoskeletal: Symmetrical with no gross deformities. Extremities: No edema. Neurological: Alert oriented x 4. No focal deficits.  Psychological: Alert and cooperative. Normal mood and affect  Assessment and Recommendations:  38) 70 year old female s/p Hartmann procedure 04/10/2020 secondary to acute sigmoid diverticulitis with perforation with plans for colostomy reversal surgery -Colonoscopy at [facility] benefits and risks discussed including risk with sedation, risk of bleeding, perforation and infection  Patient reported having hypotension with Propofol use during a colonoscopy in 2016 by Dr. Collene Mares and she had hypotension, chills and prolonged sedation during her left knee meniscus surgery in 2017. -Dr. Ardis Hughs to review and verify colonoscopy date at [facility] anesthesiologist to review past anesthesia records, adverse reaction to Propofol as noted above.  -Request copy of 2016 colonoscopy procedure and anesthesia report from Dr. Collene Mares -Request copy of last office visit from colorectal surgeon Dr. Leighton Ruff   2) RUE DVT.  Eliquis was discontinued 1 week ago.  3) Oral thrush resolved

## 2020-10-13 ENCOUNTER — Telehealth: Payer: Self-pay

## 2020-10-13 NOTE — Telephone Encounter (Signed)
Colonoscopy via anus and via colostomy at [facility] sounds reasonable.    [place] Shannahan, can you offer my first available day.  She also should be seen by anesthesia (I think they run a pre-surgery testing at [facility], can you check on that) at least several days prior. Reassure her that anesthesia has MANY options for sedation if she has had difficulty with propofol in the past.  Thanks

## 2020-10-13 NOTE — Progress Notes (Signed)
Colonoscopy via anus and via colostomy at [facility] sounds reasonable.   Patty, can you offer my first available day.  She also should be seen by anesthesia (I think they run a pre-surgery testing at [facility], can you check on that) at least several days prior. Reassure her that anesthesia has MANY options for sedation if she has had difficulty with propofol in the past.  Thanks

## 2020-10-13 NOTE — Telephone Encounter (Signed)
-----   Message from Milus Banister, MD sent at 10/13/2020  7:24 AM EDT -----    ----- Message ----- From: Noralyn Pick, NP Sent: 10/12/2020   4:21 PM EDT To: Milus Banister, MD  Dr. Ardis Hughs, please review consult note and verify when you want patient to schedule a colonoscopy with you at [facility]?  Also, what is the process to have [facility] anesthesiologist review her anesthesia records/reports of adverse response to Propofol?

## 2020-10-13 NOTE — Telephone Encounter (Signed)
Left message on machine to call back  

## 2020-10-15 ENCOUNTER — Other Ambulatory Visit: Payer: Self-pay

## 2020-10-15 DIAGNOSIS — Z933 Colostomy status: Secondary | ICD-10-CM

## 2020-10-15 DIAGNOSIS — Z1211 Encounter for screening for malignant neoplasm of colon: Secondary | ICD-10-CM

## 2020-10-15 MED ORDER — PEG 3350-KCL-NA BICARB-NACL 420 G PO SOLR: 4000.0000 mL | Freq: Once | ORAL | 0 refills | Status: AC

## 2020-10-15 NOTE — Telephone Encounter (Signed)
Colon scheduled, pt instructed and medications reviewed.  Patient instructions mailed to home.  Patient to call with any questions or concerns.  

## 2020-10-15 NOTE — Telephone Encounter (Signed)
The pt has been scheduled for colon on 11/11/20 at 930 am at [facility] with DJ.  Dr Ardis Hughs do you want her to have a full colon prep?

## 2020-10-15 NOTE — Telephone Encounter (Signed)
Left message on machine to call back  

## 2020-10-15 NOTE — Telephone Encounter (Signed)
Yes, full colonoscopy prep.  thanks

## 2020-11-11 ENCOUNTER — Encounter (HOSPITAL_COMMUNITY)
Admission: RE | Disposition: A | Discharge status: Home / Self Care | Payer: Self-pay | Source: Home / Self Care | Attending: Gastroenterology

## 2020-11-11 ENCOUNTER — Ambulatory Visit (HOSPITAL_COMMUNITY): Payer: 59 | Admitting: Certified Registered Nurse Anesthetist

## 2020-11-11 ENCOUNTER — Ambulatory Visit (HOSPITAL_COMMUNITY)
Admission: RE | Admit: 2020-11-11 | Discharge: 2020-11-11 | Disposition: A | Discharge status: Home / Self Care | Payer: 59 | Attending: Gastroenterology | Admitting: Gastroenterology

## 2020-11-11 ENCOUNTER — Encounter (HOSPITAL_COMMUNITY): Payer: Self-pay | Admitting: Gastroenterology

## 2020-11-11 ENCOUNTER — Other Ambulatory Visit: Payer: Self-pay

## 2020-11-11 DIAGNOSIS — Z91040 Latex allergy status: Secondary | ICD-10-CM | POA: Diagnosis not present

## 2020-11-11 DIAGNOSIS — Z933 Colostomy status: Secondary | ICD-10-CM | POA: Insufficient documentation

## 2020-11-11 DIAGNOSIS — Z1211 Encounter for screening for malignant neoplasm of colon: Secondary | ICD-10-CM | POA: Insufficient documentation

## 2020-11-11 DIAGNOSIS — Z881 Allergy status to other antibiotic agents status: Secondary | ICD-10-CM | POA: Diagnosis not present

## 2020-11-11 DIAGNOSIS — Z87891 Personal history of nicotine dependence: Secondary | ICD-10-CM | POA: Diagnosis not present

## 2020-11-11 DIAGNOSIS — Z882 Allergy status to sulfonamides status: Secondary | ICD-10-CM | POA: Insufficient documentation

## 2020-11-11 HISTORY — PX: COLONOSCOPY WITH PROPOFOL: SHX5780

## 2020-11-11 SURGERY — COLONOSCOPY WITH PROPOFOL
Anesthesia: Monitor Anesthesia Care

## 2020-11-11 MED ORDER — PROPOFOL 10 MG/ML IV BOLUS
INTRAVENOUS | Status: DC | PRN
  Administered 2020-11-11 (×2): 20 mg via INTRAVENOUS

## 2020-11-11 MED ORDER — PROPOFOL 500 MG/50ML IV EMUL
INTRAVENOUS | Status: DC | PRN
  Administered 2020-11-11: 150 ug/kg/min via INTRAVENOUS

## 2020-11-11 MED ORDER — LIDOCAINE 2% (20 MG/ML) 5 ML SYRINGE
INTRAMUSCULAR | Status: DC | PRN
  Administered 2020-11-11: 100 mg via INTRAVENOUS

## 2020-11-11 MED ORDER — LACTATED RINGERS IV SOLN
INTRAVENOUS | Status: DC
  Administered 2020-11-11: 1000 mL via INTRAVENOUS

## 2020-11-11 MED ORDER — PHENYLEPHRINE 40 MCG/ML (10ML) SYRINGE FOR IV PUSH (FOR BLOOD PRESSURE SUPPORT)
PREFILLED_SYRINGE | INTRAVENOUS | Status: DC | PRN
  Administered 2020-11-11: 80 ug via INTRAVENOUS
  Administered 2020-11-11 (×2): 120 ug via INTRAVENOUS

## 2020-11-11 MED ORDER — SODIUM CHLORIDE 0.9 % IV SOLN: INTRAVENOUS | Status: DC

## 2020-11-11 MED ORDER — ONDANSETRON HCL 4 MG/2ML IJ SOLN
INTRAMUSCULAR | Status: DC | PRN
  Administered 2020-11-11: 4 mg via INTRAVENOUS

## 2020-11-11 SURGICAL SUPPLY — 21 items

## 2020-11-11 NOTE — Anesthesia Preprocedure Evaluation (Signed)
Anesthesia Evaluation  Patient identified by MRN, date of birth, ID band Patient awake    Reviewed: Allergy & Precautions, NPO status , Patient's Chart, lab work & pertinent test results  History of Anesthesia Complications (+) history of anesthetic complications ("I went into shock". Decreased body temperature., WILL ONLY HAVE SURGERY WITH SPINAL ANESTHESIA ONLY!!!)  Airway Mallampati: II  TM Distance: >3 FB Neck ROM: Full    Dental  (+) Teeth Intact, Dental Advisory Given, Caps   Pulmonary former smoker,    Pulmonary exam normal breath sounds clear to auscultation       Cardiovascular Exercise Tolerance: Good (-) hypertension(-) angina(-) CAD Normal cardiovascular exam+ dysrhythmias (RBBB)  Rhythm:Regular Rate:Normal     Neuro/Psych negative neurological ROS  negative psych ROS   GI/Hepatic Neg liver ROS, GERD  ,RIH   Endo/Other  negative endocrine ROS  Renal/GU negative Renal ROS     Musculoskeletal  (+) Arthritis ,   Abdominal   Peds  Hematology negative hematology ROS (+)   Anesthesia Other Findings Day of surgery medications reviewed with the patient.  Reproductive/Obstetrics                             Anesthesia Physical  Anesthesia Plan  ASA: 2  Anesthesia Plan: MAC   Post-op Pain Management:    Induction: Intravenous  PONV Risk Score and Plan: 2 and Treatment may vary due to age or medical condition, Propofol infusion and TIVA  Airway Management Planned: Natural Airway and Nasal Cannula  Additional Equipment:   Intra-op Plan:   Post-operative Plan:   Informed Consent: I have reviewed the patients History and Physical, chart, labs and discussed the procedure including the risks, benefits and alternatives for the proposed anesthesia with the patient or authorized representative who has indicated his/her understanding and acceptance.     Dental advisory  given  Plan Discussed with: CRNA  Anesthesia Plan Comments:         Anesthesia Quick Evaluation

## 2020-11-11 NOTE — Discharge Instructions (Signed)

## 2020-11-11 NOTE — Op Note (Signed)
[facility] Patient Name: Amber Klein Procedure Date: 11/11/2020 MRN: [id number] Attending MD: Milus Banister , MD Date of Birth: Feb 24, 1950 CSN: [id number] Age: 70 Admit Type: Outpatient Procedure:                Colonoscopy Indications:              Severe diverticulitis 03/2020 s/p sigmoid resection,                            Hartman's creation, left sided colostomy. Being                            considered for ostomy takedown. Providers:                Milus Banister, MD, Particia Nearing, RN, Cherylynn Ridges, Technician, Lerry Paterson, CRNA Referring MD:             Leighton Ruff, MD Medicines:                Monitored Anesthesia Care Complications:            No immediate complications. Estimated blood loss:                            None. Estimated Blood Loss:     Estimated blood loss: none. Procedure:                Pre-Anesthesia Assessment:                           - Prior to the procedure, a History and Physical                            was performed, and patient medications and                            allergies were reviewed. The patient's tolerance of                            previous anesthesia was also reviewed. The risks                            and benefits of the procedure and the sedation                            options and risks were discussed with the patient.                            All questions were answered, and informed consent                            was obtained. Prior Anticoagulants: The patient has  taken no previous anticoagulant or antiplatelet                            agents. ASA Grade Assessment: II - A patient with                            mild systemic disease. After reviewing the risks                            and benefits, the patient was deemed in                            satisfactory condition to undergo the procedure.                            After obtaining informed consent, the colonoscope                            was passed under direct vision. Throughout the                            procedure, the patient's blood pressure, pulse, and                            oxygen saturations were monitored continuously. The                            [id number]-[id number] ([id number]) Olympus colonoscope was                            introduced through the anus and advanced to the the                            colocolonic anastomosis. The [id number]-[id number] ([id number])                            Olympus slim endoscope was introduced through the                            and advanced to the. The colonoscopy was performed                            without difficulty. The patient tolerated the                            procedure well. The quality of the bowel                            preparation was good. The rectum was photographed. Scope In: 9:37:42 AM Scope Out: 9:48:42 AM Total Procedure Duration: 0 hours 11 minutes 0 seconds  Findings:      Via anus:      1. Adult colonoscope was advanced to the proximal edge of the Hartman's       pouch and it was  normal. Surgical staples evident. There was some       solidified mucous present.      Attempt via ostomy:      1. Ostomy site was very unusual with a pinpoint (1-12mm) orifice and       granulation tissue surrounding for 1-2cm.      2. I attempted to intubate the orifice with a pediatric gastroscope       without success. Impression:               - The Hartman's pouch was normal.                           - The left sided ostomy orifice is very irregular,                            granular and pinpoint and would not allow for                            endoscopic evaluation.                           - Could consider colonoscopy during ostomy reversal                            to 'clear' the remaining colon.                           I will communicate this with Dr. Marcello Moores. Moderate Sedation:       Not Applicable - Patient had care per Anesthesia. Recommendation:           - Patient has a contact number available for                            emergencies. The signs and symptoms of potential                            delayed complications were discussed with the                            patient. Return to normal activities tomorrow.                            Written discharge instructions were provided to the                            patient.                           - Resume previous diet.                           - Continue present medications. Procedure Code(s):        --- Professional ---                           [phone], Colonoscopy, flexible; diagnostic, including  collection of specimen(s) by brushing or washing,                            when performed (separate procedure) Diagnosis Code(s):        --- Professional ---                           Z12.11, Encounter for screening for malignant                            neoplasm of colon CPT copyright 2019 American Medical Association. All rights reserved. The codes documented in this report are preliminary and upon coder review may  be revised to meet current compliance requirements. Milus Banister, MD 11/11/2020 10:01:55 AM This report has been signed electronically. Number of Addenda: 0

## 2020-11-11 NOTE — H&P (Signed)
HPI: This is a woman with left sided colostomy following sigmoid segmental resection for diverticular disease   ROS: complete GI ROS as described in HPI, all other review negative.  Constitutional:  No unintentional weight loss   Past Medical History:  Diagnosis Date   Arthritis    Cancer ([place])    basal cell skin cancer   Complication of anesthesia    "I went into shock". Decreased body temperature., WILL ONLY HAVE SURGERY WITH SPINAL ANESTHESIA ONLY!!!   Dysrhythmia    Ear infection    Environmental and seasonal allergies    Femoral hernia of right side with obstruction 01/18/2018   GERD (gastroesophageal reflux disease)    no longer having issues takes TUMS occ.   History of kidney stones    Hypotension    Irritant contact dermatitis associated with stoma 06/25/2020   Pneumonia 02/2016   walking pneumonia    Pre-diabetes    RBBB    Sinus infection     Past Surgical History:  Procedure Laterality Date   CATARACT EXTRACTION     CESAREAN SECTION     CO2 LASER APPLICATION N/A 1/93/7902   Procedure: CO2 LASER APPLICATION;  Surgeon: Vanessa Kick, MD;  Location: [facility];  Service: Gynecology;  Laterality: N/A;   COLON SURGERY     COLONOSCOPY     COLOSTOMY     FOOT SURGERY     INGUINAL HERNIA REPAIR Right 01/18/2018   Procedure: OPEN REPAIR INCARCERATED RIGHT FEMORAL HERNIA WITH MESH;  Surgeon: Fanny Skates, MD;  Location: [place];  Service: General;  Laterality: Right;  GENERAL / TAP BLOCK   KNEE SURGERY     ORIF ELBOW FRACTURE Left 07/04/2014   Procedure: OPEN REDUCTION INTERNAL FIXATION (ORIF) ELBOW/OLECRANON FRACTURE;  Surgeon: Meredith Pel, MD;  Location: [facility];  Service: Orthopedics;  Laterality: Left;   REFRACTIVE SURGERY     VULVECTOMY N/A 08/10/2016   Procedure: WIDE EXCISION VULVECTOMY;  Surgeon: Vanessa Kick, MD;  Location: [facility];  Service: Gynecology;  Laterality: N/A;    Current Facility-Administered Medications  Medication Dose Route Frequency Provider  Last Rate Last Admin   0.9 %  sodium chloride infusion   Intravenous Continuous Milus Banister, MD       lactated ringers infusion   Intravenous Continuous Milus Banister, MD        Allergies as of 10/15/2020 - Review Complete 10/11/2020  Allergen Reaction Noted   Iodinated diagnostic agents Anaphylaxis 05/06/2015   Propofol Anaphylaxis 04/23/2015   Shellfish allergy Anaphylaxis 05/06/2015   Vancomycin Anaphylaxis 05/09/2020   Ciprofloxacin Hives and Swelling 05/06/2015   Sulfa antibiotics Swelling and Other (See Comments) 05/06/2015   Zithromax [azithromycin] Hives 05/06/2015   Iodine     Latex Rash 08/03/2016    Family History  Problem Relation Age of Onset   Dementia Mother    Kidney cancer Father    Breast cancer Maternal Grandmother    Colon cancer Neg Hx    Esophageal cancer Neg Hx    Pancreatic cancer Neg Hx    Stomach cancer Neg Hx     Social History   Socioeconomic History   Marital status: Married    Spouse name: Not on file   Number of children: 2   Years of education: Not on file   Highest education level: Not on file  Occupational History   Not on file  Tobacco Use   Smoking status: Former    Packs/day: 0.10    Years: 40.00  Pack years: 4.00    Types: Cigarettes    Quit date: 08/09/2014    Years since quitting: 6.2   Smokeless tobacco: Never  Vaping Use   Vaping Use: Never used  Substance and Sexual Activity   Alcohol use: Yes    Comment: 1-2 beers per week   Drug use: No   Sexual activity: Yes    Birth control/protection: Post-menopausal  Other Topics Concern   Not on file  Social History Narrative   Not on file   Social Determinants of Health   Financial Resource Strain: Not on file  Food Insecurity: Not on file  Transportation Needs: Not on file  Physical Activity: Not on file  Stress: Not on file  Social Connections: Not on file  Intimate Partner Violence: Not on file     Physical Exam: There were no vitals taken for this  visit. Constitutional: generally well-appearing Psychiatric: alert and oriented x3 Abdomen: soft, nontender, nondistended, no obvious ascites, no peritoneal signs, normal bowel sounds No peripheral edema noted in lower extremities  Assessment and plan: 70 y.o. female with colostomy  For colon cancer screening prior to colostomy reversal  Please see the "Patient Instructions" section for addition details about the plan.  Owens Loffler, MD [facility] 11/11/2020, 8:27 AM

## 2020-11-11 NOTE — Transfer of Care (Signed)
Immediate Anesthesia Transfer of Care Note  Patient: Amber Klein  Procedure(s) Performed: COLONOSCOPY WITH PROPOFOL  Patient Location: Endoscopy Unit  Anesthesia Type:MAC  Level of Consciousness: awake  Airway & Oxygen Therapy: Patient Spontanous Breathing  Post-op Assessment: Report given to RN and Post -op Vital signs reviewed and stable  Post vital signs: Reviewed and stable  Last Vitals:  Vitals Value Taken Time  BP 95/73 11/11/20 0957  Temp    Pulse 98 11/11/20 0958  Resp 15 11/11/20 0958  SpO2 95 % 11/11/20 0958  Vitals shown include unvalidated device data.  Last Pain:  Vitals:   11/11/20 0957  TempSrc:   PainSc: 0-No pain         Complications: No notable events documented.

## 2020-11-12 ENCOUNTER — Encounter (HOSPITAL_COMMUNITY): Payer: Self-pay | Admitting: Gastroenterology

## 2020-11-12 NOTE — Anesthesia Postprocedure Evaluation (Signed)
Anesthesia Post Note  Patient: Amber Klein  Procedure(s) Performed: COLONOSCOPY WITH PROPOFOL     Patient location during evaluation: PACU Anesthesia Type: MAC Level of consciousness: awake and alert Pain management: pain level controlled Vital Signs Assessment: post-procedure vital signs reviewed and stable Respiratory status: spontaneous breathing Cardiovascular status: stable Anesthetic complications: no   No notable events documented.  Last Vitals:  Vitals:   11/11/20 1010 11/11/20 1015  BP: 120/77 106/70  Pulse: 91 87  Resp: 16 16  Temp:    SpO2: 95% 94%    Last Pain:  Vitals:   11/11/20 1010  TempSrc:   PainSc: 0-No pain                 Nolon Nations

## 2020-12-08 ENCOUNTER — Emergency Department (HOSPITAL_BASED_OUTPATIENT_CLINIC_OR_DEPARTMENT_OTHER): Payer: 59

## 2020-12-08 ENCOUNTER — Emergency Department (HOSPITAL_COMMUNITY): Payer: 59 | Admitting: Anesthesiology

## 2020-12-08 ENCOUNTER — Observation Stay (HOSPITAL_BASED_OUTPATIENT_CLINIC_OR_DEPARTMENT_OTHER)
Admission: EM | Admit: 2020-12-08 | Discharge: 2020-12-09 | Disposition: A | Discharge status: Home / Self Care | Payer: 59 | Attending: Emergency Medicine | Admitting: Emergency Medicine

## 2020-12-08 ENCOUNTER — Inpatient Hospital Stay: Admit: 2020-12-08 | Payer: 59 | Admitting: Surgery

## 2020-12-08 ENCOUNTER — Encounter (HOSPITAL_BASED_OUTPATIENT_CLINIC_OR_DEPARTMENT_OTHER): Payer: Self-pay

## 2020-12-08 ENCOUNTER — Other Ambulatory Visit: Payer: Self-pay

## 2020-12-08 ENCOUNTER — Encounter (HOSPITAL_COMMUNITY)
Admission: EM | Disposition: A | Discharge status: Home / Self Care | Payer: Self-pay | Source: Home / Self Care | Attending: Emergency Medicine

## 2020-12-08 DIAGNOSIS — K9403 Colostomy malfunction: Secondary | ICD-10-CM | POA: Diagnosis present

## 2020-12-08 DIAGNOSIS — K219 Gastro-esophageal reflux disease without esophagitis: Secondary | ICD-10-CM | POA: Diagnosis present

## 2020-12-08 DIAGNOSIS — R1031 Right lower quadrant pain: Secondary | ICD-10-CM | POA: Diagnosis present

## 2020-12-08 DIAGNOSIS — I10 Essential (primary) hypertension: Secondary | ICD-10-CM | POA: Insufficient documentation

## 2020-12-08 DIAGNOSIS — K5720 Diverticulitis of large intestine with perforation and abscess without bleeding: Secondary | ICD-10-CM | POA: Diagnosis present

## 2020-12-08 DIAGNOSIS — K56609 Unspecified intestinal obstruction, unspecified as to partial versus complete obstruction: Secondary | ICD-10-CM | POA: Diagnosis not present

## 2020-12-08 DIAGNOSIS — Z20822 Contact with and (suspected) exposure to covid-19: Secondary | ICD-10-CM | POA: Insufficient documentation

## 2020-12-08 DIAGNOSIS — Z91040 Latex allergy status: Secondary | ICD-10-CM | POA: Insufficient documentation

## 2020-12-08 DIAGNOSIS — Z8051 Family history of malignant neoplasm of kidney: Secondary | ICD-10-CM

## 2020-12-08 DIAGNOSIS — Y838 Other surgical procedures as the cause of abnormal reaction of the patient, or of later complication, without mention of misadventure at the time of the procedure: Secondary | ICD-10-CM | POA: Diagnosis present

## 2020-12-08 DIAGNOSIS — Z87442 Personal history of urinary calculi: Secondary | ICD-10-CM

## 2020-12-08 DIAGNOSIS — Z79899 Other long term (current) drug therapy: Secondary | ICD-10-CM | POA: Insufficient documentation

## 2020-12-08 DIAGNOSIS — Z433 Encounter for attention to colostomy: Principal | ICD-10-CM

## 2020-12-08 DIAGNOSIS — Z803 Family history of malignant neoplasm of breast: Secondary | ICD-10-CM

## 2020-12-08 DIAGNOSIS — Z87891 Personal history of nicotine dependence: Secondary | ICD-10-CM | POA: Insufficient documentation

## 2020-12-08 DIAGNOSIS — Z91041 Radiographic dye allergy status: Secondary | ICD-10-CM

## 2020-12-08 DIAGNOSIS — Z85828 Personal history of other malignant neoplasm of skin: Secondary | ICD-10-CM | POA: Insufficient documentation

## 2020-12-08 DIAGNOSIS — Z91013 Allergy to seafood: Secondary | ICD-10-CM

## 2020-12-08 DIAGNOSIS — M1990 Unspecified osteoarthritis, unspecified site: Secondary | ICD-10-CM | POA: Diagnosis present

## 2020-12-08 HISTORY — PX: STOMA DILATATION: SHX6101

## 2020-12-08 LAB — COMPREHENSIVE METABOLIC PANEL
ALT: 21 U/L (ref 0–44)
AST: 17 U/L (ref 15–41)
Albumin: 3.9 g/dL (ref 3.5–5.0)
Alkaline Phosphatase: 69 U/L (ref 38–126)
Anion gap: 10 (ref 5–15)
BUN: 10 mg/dL (ref 8–23)
CO2: 26 mmol/L (ref 22–32)
Calcium: 9.4 mg/dL (ref 8.9–10.3)
Chloride: 103 mmol/L (ref 98–111)
Creatinine, Ser: 0.8 mg/dL (ref 0.44–1.00)
GFR, Estimated: >60 mL/min (ref >60)
Glucose, Bld: 111 mg/dL — ABNORMAL HIGH (ref 70–99)
Potassium: 3.1 mmol/L — ABNORMAL LOW (ref 3.5–5.1)
Sodium: 139 mmol/L (ref 135–145)
Total Bilirubin: 0.4 mg/dL (ref 0.3–1.2)
Total Protein: 6.6 g/dL (ref 6.5–8.1)

## 2020-12-08 LAB — CBC
HCT: 42.4 % (ref 36.0–46.0)
Hemoglobin: 13.7 g/dL (ref 12.0–15.0)
MCH: 28.4 pg (ref 26.0–34.0)
MCHC: 32.3 g/dL (ref 30.0–36.0)
MCV: 88 fL (ref 80.0–100.0)
Platelets: 298 10*3/uL (ref 150–400)
RBC: 4.82 MIL/uL (ref 3.87–5.11)
RDW: 13.5 % (ref 11.5–15.5)
WBC: 9.1 10*3/uL (ref 4.0–10.5)
nRBC: 0 % (ref 0.0–0.2)

## 2020-12-08 LAB — RESP PANEL BY RT-PCR (FLU A&B, COVID) ARPGX2
Influenza A by PCR: NEGATIVE
Influenza B by PCR: NEGATIVE
SARS Coronavirus 2 by RT PCR: NEGATIVE

## 2020-12-08 LAB — LIPASE, BLOOD: Lipase: 18 U/L (ref 11–51)

## 2020-12-08 LAB — LACTIC ACID, PLASMA: Lactic Acid, Venous: 0.6 mmol/L (ref 0.5–1.9)

## 2020-12-08 SURGERY — STOMA DILATATION
Anesthesia: General

## 2020-12-08 MED ORDER — PROPOFOL 10 MG/ML IV BOLUS
INTRAVENOUS | Status: AC
  Filled 2020-12-08: qty 20

## 2020-12-08 MED ORDER — CHLORHEXIDINE GLUCONATE 0.12 % MT SOLN
15.0000 mL | OROMUCOSAL | Status: AC
  Administered 2020-12-08: 15 mL via OROMUCOSAL

## 2020-12-08 MED ORDER — OXYCODONE HCL 5 MG PO TABS
5.0000 mg | ORAL_TABLET | ORAL | Status: DC | PRN
  Administered 2020-12-08: 10 mg via ORAL
  Filled 2020-12-08: qty 2

## 2020-12-08 MED ORDER — ALBUTEROL SULFATE (2.5 MG/3ML) 0.083% IN NEBU: 3.0000 mL | INHALATION_SOLUTION | Freq: Four times a day (QID) | RESPIRATORY_TRACT | Status: DC | PRN

## 2020-12-08 MED ORDER — ONDANSETRON 4 MG PO TBDP: 4.0000 mg | ORAL_TABLET | Freq: Four times a day (QID) | ORAL | Status: DC | PRN

## 2020-12-08 MED ORDER — ACETAMINOPHEN 325 MG PO TABS: 325.0000 mg | ORAL_TABLET | ORAL | Status: DC | PRN

## 2020-12-08 MED ORDER — LACTATED RINGERS IV SOLN: INTRAVENOUS | Status: AC

## 2020-12-08 MED ORDER — FENTANYL CITRATE (PF) 100 MCG/2ML IJ SOLN
INTRAMUSCULAR | Status: DC | PRN
  Administered 2020-12-08 (×2): 25 ug via INTRAVENOUS

## 2020-12-08 MED ORDER — ONDANSETRON HCL 4 MG/2ML IJ SOLN
4.0000 mg | Freq: Once | INTRAMUSCULAR | Status: AC
  Administered 2020-12-08: 4 mg via INTRAVENOUS
  Filled 2020-12-08: qty 2

## 2020-12-08 MED ORDER — MORPHINE SULFATE (PF) 2 MG/ML IV SOLN
2.0000 mg | INTRAVENOUS | Status: DC | PRN
Start: 2020-12-08 — End: 2020-12-09

## 2020-12-08 MED ORDER — LATANOPROST 0.005 % OP SOLN
1.0000 [drp] | Freq: Every day | OPHTHALMIC | Status: DC
  Administered 2020-12-08: 1 [drp] via OPHTHALMIC
  Filled 2020-12-08: qty 2.5

## 2020-12-08 MED ORDER — DEXAMETHASONE SODIUM PHOSPHATE 10 MG/ML IJ SOLN
INTRAMUSCULAR | Status: DC | PRN
  Administered 2020-12-08: 10 mg via INTRAVENOUS

## 2020-12-08 MED ORDER — KCL IN DEXTROSE-NACL 40-5-0.45 MEQ/L-%-% IV SOLN
INTRAVENOUS | Status: DC
  Filled 2020-12-08 (×2): qty 1000

## 2020-12-08 MED ORDER — LIDOCAINE 2% (20 MG/ML) 5 ML SYRINGE
INTRAMUSCULAR | Status: DC | PRN
  Administered 2020-12-08: 100 mg via INTRAVENOUS

## 2020-12-08 MED ORDER — METOPROLOL TARTRATE 5 MG/5ML IV SOLN
5.0000 mg | Freq: Four times a day (QID) | INTRAVENOUS | Status: DC | PRN
Start: 2020-12-08 — End: 2020-12-09

## 2020-12-08 MED ORDER — MORPHINE SULFATE (PF) 4 MG/ML IV SOLN
4.0000 mg | Freq: Once | INTRAVENOUS | Status: AC
  Administered 2020-12-08: 4 mg via INTRAVENOUS
  Filled 2020-12-08: qty 1

## 2020-12-08 MED ORDER — MEPERIDINE HCL 50 MG/ML IJ SOLN: 6.2500 mg | INTRAMUSCULAR | Status: DC | PRN

## 2020-12-08 MED ORDER — PHENYLEPHRINE 40 MCG/ML (10ML) SYRINGE FOR IV PUSH (FOR BLOOD PRESSURE SUPPORT)
PREFILLED_SYRINGE | INTRAVENOUS | Status: DC | PRN
  Administered 2020-12-08 (×3): 80 ug via INTRAVENOUS

## 2020-12-08 MED ORDER — ONDANSETRON HCL 4 MG/2ML IJ SOLN: 4.0000 mg | Freq: Once | INTRAMUSCULAR | Status: DC | PRN

## 2020-12-08 MED ORDER — SODIUM CHLORIDE 0.9 % IV SOLN
2.0000 g | INTRAVENOUS | Status: DC
  Filled 2020-12-08 (×2): qty 2

## 2020-12-08 MED ORDER — FENTANYL CITRATE PF 50 MCG/ML IJ SOSY: 25.0000 ug | PREFILLED_SYRINGE | INTRAMUSCULAR | Status: DC | PRN

## 2020-12-08 MED ORDER — OXYCODONE HCL 5 MG/5ML PO SOLN
5.0000 mg | Freq: Once | ORAL | Status: DC | PRN
Start: 2020-12-08 — End: 2020-12-08

## 2020-12-08 MED ORDER — PROPOFOL 10 MG/ML IV BOLUS
INTRAVENOUS | Status: DC | PRN
  Administered 2020-12-08: 120 mg via INTRAVENOUS

## 2020-12-08 MED ORDER — ENOXAPARIN SODIUM 40 MG/0.4ML IJ SOSY
40.0000 mg | PREFILLED_SYRINGE | INTRAMUSCULAR | Status: DC
  Administered 2020-12-09: 40 mg via SUBCUTANEOUS
  Filled 2020-12-08: qty 0.4

## 2020-12-08 MED ORDER — DIPHENHYDRAMINE HCL 50 MG/ML IJ SOLN: 12.5000 mg | Freq: Four times a day (QID) | INTRAMUSCULAR | Status: DC | PRN

## 2020-12-08 MED ORDER — ACETAMINOPHEN 325 MG PO TABS: 650.0000 mg | ORAL_TABLET | Freq: Four times a day (QID) | ORAL | Status: DC | PRN

## 2020-12-08 MED ORDER — MORPHINE SULFATE (PF) 4 MG/ML IV SOLN
4.0000 mg | INTRAVENOUS | Status: DC | PRN
  Administered 2020-12-08: 4 mg via INTRAVENOUS
  Filled 2020-12-08: qty 1

## 2020-12-08 MED ORDER — ONDANSETRON HCL 4 MG/2ML IJ SOLN: 4.0000 mg | Freq: Four times a day (QID) | INTRAMUSCULAR | Status: DC | PRN

## 2020-12-08 MED ORDER — FENTANYL CITRATE (PF) 100 MCG/2ML IJ SOLN
INTRAMUSCULAR | Status: AC
  Filled 2020-12-08: qty 2

## 2020-12-08 MED ORDER — LACTATED RINGERS IV SOLN: INTRAVENOUS | Status: DC

## 2020-12-08 MED ORDER — ACETAMINOPHEN 160 MG/5ML PO SOLN: 325.0000 mg | ORAL | Status: DC | PRN

## 2020-12-08 MED ORDER — OXYCODONE HCL 5 MG PO TABS: 5.0000 mg | ORAL_TABLET | Freq: Once | ORAL | Status: DC | PRN

## 2020-12-08 MED ORDER — DIPHENHYDRAMINE HCL 12.5 MG/5ML PO ELIX: 12.5000 mg | ORAL_SOLUTION | Freq: Four times a day (QID) | ORAL | Status: DC | PRN

## 2020-12-08 MED ORDER — ONDANSETRON HCL 4 MG/2ML IJ SOLN
INTRAMUSCULAR | Status: DC | PRN
  Administered 2020-12-08: 4 mg via INTRAVENOUS

## 2020-12-08 MED ORDER — 0.9 % SODIUM CHLORIDE (POUR BTL) OPTIME
TOPICAL | Status: DC | PRN
  Administered 2020-12-08: 1000 mL

## 2020-12-08 MED ORDER — SODIUM CHLORIDE 0.9 % IV BOLUS
1000.0000 mL | Freq: Once | INTRAVENOUS | Status: AC
  Administered 2020-12-08: 1000 mL via INTRAVENOUS

## 2020-12-08 MED ORDER — ACETAMINOPHEN 650 MG RE SUPP: 650.0000 mg | Freq: Four times a day (QID) | RECTAL | Status: DC | PRN

## 2020-12-08 SURGICAL SUPPLY — 8 items
GAUZE SPONGE 4X4 12PLY STRL (GAUZE/BANDAGES/DRESSINGS) ×2 IMPLANT
HANDLE SUCTION POOLE (INSTRUMENTS) ×1 IMPLANT
SOL PREP PROV IODINE SCRUB 4OZ (MISCELLANEOUS) ×2 IMPLANT
SPONGE T-LAP 18X18 ~~LOC~~+RFID (SPONGE) ×2 IMPLANT
SUCTION POOLE HANDLE (INSTRUMENTS) ×2
SUT CHROMIC 3 0 PS 2 (SUTURE) ×2 IMPLANT
TOWEL OR 17X26 10 PK STRL BLUE (TOWEL DISPOSABLE) ×2 IMPLANT
YANKAUER SUCT BULB TIP NO VENT (SUCTIONS) ×2 IMPLANT

## 2020-12-08 NOTE — Op Note (Signed)
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   Location Surgery: [facility] OR 1 Complications: None noted  EBL:   minimal cc  Drains: none  Description of Procedure:  The patient was taken to OR 1 .  After anesthesia was administered and the patient was prepped  with betadine around the stoma  and a timeout was performed.  The stoma opening was about 2 mm.  This was cannulated with the small end of a Poole sucker.  The Kelly clamp was used to fracture the graulation tissue that produced the obstruction.  This created a superior flap and I pexed with flap back with three sutures of 3-0 chromic using vertical mattress technique.  Opposite this flap, I tacked the lower flap with a single 3-0 chromic.    The patient will be kept overnight for observation.   The patient tolerated the procedure well and was taken to the PACU in stable condition.     Matt B. Hassell Done, [place], [facility], [place]

## 2020-12-08 NOTE — Anesthesia Postprocedure Evaluation (Signed)
Anesthesia Post Note  Patient: Amber Klein  Procedure(s) Performed: [place] STOMAPLASTY     Patient location during evaluation: PACU Anesthesia Type: General Level of consciousness: awake and alert Pain management: pain level controlled Vital Signs Assessment: post-procedure vital signs reviewed and stable Respiratory status: spontaneous breathing, nonlabored ventilation, respiratory function stable and patient connected to nasal cannula oxygen Cardiovascular status: blood pressure returned to baseline and stable Postop Assessment: no apparent nausea or vomiting Anesthetic complications: no   No notable events documented.  Last Vitals:  Vitals:   12/08/20 1409 12/08/20 1517  BP: (!) 127/97 (!) 107/58  Pulse: 96 77  Resp: 20 11  Temp: 37.2 C 36.8 C  SpO2: 94% 100%    Last Pain:  Vitals:   12/08/20 1517  TempSrc:   PainSc: 4                  Danetra Glock

## 2020-12-08 NOTE — Anesthesia Procedure Notes (Signed)
Procedure Name: LMA Insertion Date/Time: 12/08/2020 2:43 PM Performed by: Lind Covert, CRNA Pre-anesthesia Checklist: Patient identified, Emergency Drugs available, Suction available, Patient being monitored and Timeout performed Patient Re-evaluated:Patient Re-evaluated prior to induction Oxygen Delivery Method: Circle system utilized Preoxygenation: Pre-oxygenation with 100% oxygen Induction Type: IV induction Ventilation: Mask ventilation without difficulty LMA: LMA inserted LMA Size: 4.0 Number of attempts: 1 Placement Confirmation: positive ETCO2 and breath sounds checked- equal and bilateral Tube secured with: Tape Dental Injury: Teeth and Oropharynx as per pre-operative assessment

## 2020-12-08 NOTE — H&P (Addendum)
H&P Note  Amber Klein [birth year]  [id number].    Requesting MD: Sherwood Gambler, MD Chief Complaint/Reason for Consult: Abdominal pain HPI:  Patient is a 70 year old female who presented to [facility] earlier today with abdominal pain that started yesterday. Pain is located in RLQ and intermittent, cramping in character. She reports associated low grade fever of 100, vomiting, low colostomy output and distention. Patient underwent Hartmann's procedure on 04/10/20 for perforated sigmoid diverticulitis with septic shock. She was in [place], [place] and presented to the local hospital there. She was discharged home 05/05/20. She has been followed by Dr. Leighton Ruff in our office and was planning colostomy reversal. Prior to this a colonoscopy was recommended. Colonoscopy was attempted 11/11/20 but stoma was unable to be traversed by scope.   PMH otherwise significant for GERD, Hx of RBBB, Hx of skin cancer, pre-diabetes. Past abdominal surgeries other than above include cesarean section, open right inguinal hernia repair. Patient has allergies to iodinated contrast agents, vancomycin, cipro, sulfa abx, azithromycin and latex. She is not on any blood thinning medications.  ROS: Review of Systems  Constitutional:  Positive for fever. Negative for chills.  Respiratory:  Negative for shortness of breath and wheezing.   Cardiovascular:  Negative for chest pain and palpitations.  Gastrointestinal:  Positive for abdominal pain, constipation, nausea and vomiting. Negative for diarrhea.  All other systems reviewed and are negative.  Family History  Problem Relation Age of Onset   Dementia Mother    Kidney cancer Father    Breast cancer Maternal Grandmother    Colon cancer Neg Hx    Esophageal cancer Neg Hx    Pancreatic cancer Neg Hx    Stomach cancer Neg Hx     Past Medical History:  Diagnosis Date   Arthritis    Cancer ([place])    basal cell skin cancer   Complication of anesthesia     "I went into shock". Decreased body temperature., WILL ONLY HAVE SURGERY WITH SPINAL ANESTHESIA ONLY!!!   Dysrhythmia    Ear infection    Environmental and seasonal allergies    Femoral hernia of right side with obstruction 01/18/2018   GERD (gastroesophageal reflux disease)    no longer having issues takes TUMS occ.   History of kidney stones    Hypotension    Irritant contact dermatitis associated with stoma 06/25/2020   Pneumonia 02/2016   walking pneumonia    Pre-diabetes    RBBB    Sinus infection     Past Surgical History:  Procedure Laterality Date   CATARACT EXTRACTION     CESAREAN SECTION     CO2 LASER APPLICATION N/A 3/66/4403   Procedure: CO2 LASER APPLICATION;  Surgeon: Vanessa Kick, MD;  Location: [facility];  Service: Gynecology;  Laterality: N/A;   COLON SURGERY     COLONOSCOPY     COLONOSCOPY WITH PROPOFOL N/A 11/11/2020   Procedure: COLONOSCOPY WITH PROPOFOL;  Surgeon: Milus Banister, MD;  Location: [facility];  Service: Endoscopy;  Laterality: N/A;  NEEDS PREVISIT WITH ANESTHESIA   COLOSTOMY     FOOT SURGERY     INGUINAL HERNIA REPAIR Right 01/18/2018   Procedure: OPEN REPAIR INCARCERATED RIGHT FEMORAL HERNIA WITH MESH;  Surgeon: Fanny Skates, MD;  Location: [facility];  Service: General;  Laterality: Right;  GENERAL / TAP BLOCK   KNEE SURGERY     ORIF ELBOW FRACTURE Left 07/04/2014   Procedure: OPEN REDUCTION INTERNAL FIXATION (ORIF) ELBOW/OLECRANON FRACTURE;  Surgeon: Meredith Pel, MD;  Location: [facility];  Service: Orthopedics;  Laterality: Left;   REFRACTIVE SURGERY     VULVECTOMY N/A 08/10/2016   Procedure: WIDE EXCISION VULVECTOMY;  Surgeon: Vanessa Kick, MD;  Location: [facility];  Service: Gynecology;  Laterality: N/A;    Social History:  reports that she quit smoking about 6 years ago. Her smoking use included cigarettes. She has a 4.00 pack-year smoking history. She has never used smokeless tobacco. She reports current alcohol use. She reports that she  does not use drugs.  Allergies:  Allergies  Allergen Reactions   Iodinated Diagnostic Agents Anaphylaxis   Propofol     Couldn't wake patient up   Shellfish Allergy Anaphylaxis   Vancomycin Anaphylaxis   Ciprofloxacin Hives and Swelling    Hives   Sulfa Antibiotics Swelling and Other (See Comments)    Migraine   Zithromax [Azithromycin] Hives   Iodine     UNSPECIFIED REACTION    Latex Rash    Sensitivity on too long    (Not in a hospital admission)   Blood pressure 122/76, pulse 97, temperature 100.2 F (37.9 C), temperature source Oral, resp. rate 15, height 5\' 4"  (1.626 m), weight 71.2 kg, SpO2 95 %. Physical Exam:  General: pleasant, WD, WN female who is laying in bed in NAD HEENT: head is normocephalic, atraumatic.  Sclera are noninjected. Ears and nose without any masses or lesions.  Mouth is pink and moist Heart: regular, rate, and rhythm. Palpable radial and pedal pulses bilaterally Lungs: No audible wheezing Respiratory effort nonlabored Abd: soft, mild generalized ttp, moderate distention, midline surgical scar well healed, stoma in LLQ with soft brown stool in ostomy appliance MS: all 4 extremities are symmetrical with no cyanosis, clubbing, or edema. Skin: warm and dry with no masses, lesions, or rashes Neuro: Cranial nerves 2-12 grossly intact, sensation is normal throughout Psych: A&Ox3 with an appropriate affect.   Results for orders placed or performed during the hospital encounter of 12/08/20 (from the past 48 hour(s))  Lipase, blood     Status: None   Collection Time: 12/08/20  9:23 AM  Result Value Ref Range   Lipase 18 11 - 51 U/L    Comment: Performed at [facility], [place], [place] [address]  Comprehensive metabolic panel     Status: Abnormal   Collection Time: 12/08/20  9:23 AM  Result Value Ref Range   Sodium 139 135 - 145 mmol/L   Potassium 3.1 (L) 3.5 - 5.1 mmol/L   Chloride 103 98 - 111 mmol/L   CO2 26  22 - 32 mmol/L   Glucose, Bld 111 (H) 70 - 99 mg/dL    Comment: Glucose reference range applies only to samples taken after fasting for at least 8 hours.   BUN 10 8 - 23 mg/dL   Creatinine, Ser 0.80 0.44 - 1.00 mg/dL   Calcium 9.4 8.9 - 10.3 mg/dL   Total Protein 6.6 6.5 - 8.1 g/dL   Albumin 3.9 3.5 - 5.0 g/dL   AST 17 15 - 41 U/L   ALT 21 0 - 44 U/L   Alkaline Phosphatase 69 38 - 126 U/L   Total Bilirubin 0.4 0.3 - 1.2 mg/dL   GFR, Estimated >60 >60 mL/min    Comment: (NOTE) Calculated using the CKD-EPI Creatinine Equation (2021)    Anion gap 10 5 - 15    Comment: Performed at [facility], [address], [place], [place] [address]  CBC     Status: None   Collection Time: 12/08/20  9:23 AM  Result Value Ref Range   WBC 9.1 4.0 - 10.5 K/uL   RBC 4.82 3.87 - 5.11 MIL/uL   Hemoglobin 13.7 12.0 - 15.0 g/dL   HCT 42.4 36.0 - 46.0 %   MCV 88.0 80.0 - 100.0 fL   MCH 28.4 26.0 - 34.0 pg   MCHC 32.3 30.0 - 36.0 g/dL   RDW 13.5 11.5 - 15.5 %   Platelets 298 150 - 400 K/uL   nRBC 0.0 0.0 - 0.2 %    Comment: Performed at [facility], [address], [place], [place] [address]  Resp Panel by RT-PCR (Flu A&B, Covid) Nasopharyngeal Swab     Status: None   Collection Time: 12/08/20  9:59 AM   Specimen: Nasopharyngeal Swab; Nasopharyngeal(NP) swabs in vial transport medium  Result Value Ref Range   SARS Coronavirus 2 by RT PCR NEGATIVE NEGATIVE    Comment: (NOTE) SARS-CoV-2 target nucleic acids are NOT DETECTED.  The SARS-CoV-2 RNA is generally detectable in upper respiratory specimens during the acute phase of infection. The lowest concentration of SARS-CoV-2 viral copies this assay can detect is 138 copies/mL. A negative result does not preclude SARS-Cov-2 infection and should not be used as the sole basis for treatment or other patient management decisions. A negative result may occur with  improper specimen collection/handling, submission of  specimen other than nasopharyngeal swab, presence of viral mutation(s) within the areas targeted by this assay, and inadequate number of viral copies(<138 copies/mL). A negative result must be combined with clinical observations, patient history, and epidemiological information. The expected result is Negative.  Fact Sheet for Patients:  EntrepreneurPulse.com.au  Fact Sheet for Healthcare Providers:  IncredibleEmployment.be  This test is no t yet approved or cleared by the [place] FDA and  has been authorized for detection and/or diagnosis of SARS-CoV-2 by FDA under an Emergency Use Authorization (EUA). This EUA will remain  in effect (meaning this test can be used) for the duration of the COVID-19 declaration under Section 564(b)(1) of the Act, 21 U.S.C.section 360bbb-3(b)(1), unless the authorization is terminated  or revoked sooner.       Influenza A by PCR NEGATIVE NEGATIVE   Influenza B by PCR NEGATIVE NEGATIVE    Comment: (NOTE) The Xpert Xpress SARS-CoV-2/FLU/RSV plus assay is intended as an aid in the diagnosis of influenza from Nasopharyngeal swab specimens and should not be used as a sole basis for treatment. Nasal washings and aspirates are unacceptable for Xpert Xpress SARS-CoV-2/FLU/RSV testing.  Fact Sheet for Patients: EntrepreneurPulse.com.au  Fact Sheet for Healthcare Providers: IncredibleEmployment.be  This test is not yet approved or cleared by the [place] FDA and has been authorized for detection and/or diagnosis of SARS-CoV-2 by FDA under an Emergency Use Authorization (EUA). This EUA will remain in effect (meaning this test can be used) for the duration of the COVID-19 declaration under Section 564(b)(1) of the Act, 21 U.S.C. section 360bbb-3(b)(1), unless the authorization is terminated or revoked.  Performed at [facility], [address], [place], [place] [address]    CT ABDOMEN PELVIS WO CONTRAST  Result Date: 12/08/2020 CLINICAL DATA:  Acute generalized abdominal pain. EXAM: CT ABDOMEN AND PELVIS WITHOUT CONTRAST TECHNIQUE: Multidetector CT imaging of the abdomen and pelvis was performed following the standard protocol without IV contrast. COMPARISON:  January 14, 2018. FINDINGS: Lower chest: No acute abnormality. Hepatobiliary: No cholelithiasis is noted. Left and right  hepatic pneumobilia is noted, and some degree of pneumobilia was present on prior exam. However, the possibility of portal venous gas cannot be excluded as there does appear to be more air compared to prior exam. No hepatic parenchymal abnormality is noted on these unenhanced images. Pancreas: Unremarkable. No pancreatic ductal dilatation or surrounding inflammatory changes. Spleen: Normal in size without focal abnormality. Adrenals/Urinary Tract: Adrenal glands are unremarkable. Kidneys are normal, without renal calculi, focal lesion, or hydronephrosis. Bladder is unremarkable. Stomach/Bowel: The stomach is unremarkable. Large duodenal diverticulum is noted. No small bowel dilatation is noted. Colostomy is noted in left lower quadrant. There is moderate to severe dilatation of the more proximal colon, particularly the cecum. There appears to be some degree of pneumatosis involving the cecum. The appendix appears normal. Vascular/Lymphatic: Aortic atherosclerosis. No enlarged abdominal or pelvic lymph nodes. Reproductive: Uterus and bilateral adnexa are unremarkable. Other: No ascites is noted.  Small periumbilical hernia is noted. Musculoskeletal: No acute or significant osseous findings. 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 There is moderate to severe dilatation of the more proximal colon, particularly the cecum. No definite transition zone is noted to suggest obstruction. There is noted possible intramural gas involving a  portion of the cecum. While there was some degree pneumobilia present on the prior exam, there is an increased amount of air, including the non dependent portion of the left hepatic lobe, and the possibility of portal venous gas cannot can not be excluded. Therefore, the possibility of ischemic bowel cannot be excluded. Critical Value/emergent results were called by telephone at the time of interpretation on 12/08/2020 at 11:04 am to provider Sherwood Gambler , who verbally acknowledged these results. Aortic Atherosclerosis (ICD10-I70.0). Electronically Signed   By: Marijo Conception M.D.   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 No definite transition zone is noted to suggest obstruction. There is noted possible intramural gas involving a portion of the cecum - no leukocytosis and VSS - abdominal exam without peritonitis but distended  - to OR to attempt to dilate stoma and decompress colon  FEN: NPO, IVF VTE: SCDs ID: [email or web address] pre-op  Admit to inpatient post-operatively. If patient unable to tolerate PO intake after stomal dilation, will need to consider colostomy reversal vs revision on a more urgent/emergent basis.   Norm Parcel, [facility] 12/08/2020, 11:28 AM Please see Amion for pager number during day hours 7:00am-4:30pm

## 2020-12-08 NOTE — ED Triage Notes (Signed)
Pt presents with generalized abd discomfort x1 day, starts with "gas pressure, gurgling and sharp pains" pt has a colostomy since 04/10/20, has a scheduled appt for a reversal next week. Pt reports decrease stool from colonoscopy x1 month. Pt had an attempted colonoscopy September 2022, unable to complete, advised to schedule appt for reversal.  Pt has not eaten x1 day, able to keep liquids down. Pt reports decrease PO intake today d/t nausea

## 2020-12-08 NOTE — ED Provider Notes (Addendum)
[facility] DE[facility] Provider Note   CSN: [id number] Arrival date & time: 12/08/20  3244     History Chief Complaint  Patient presents with   Abdominal Pain    Amber Klein is a 70 y.o. female.  HPI 70 year old female presents with abdominal pain.  Acutely started yesterday and was severe upon onset.  Is mostly right-sided and lower.  It moves in waves and comes like severe cramping.  She has had a low-grade fever of 100 at home.  She has been having multiple episodes of vomiting.  She is had minimal output out of her colostomy.  She is due for a revision and a couple weeks ago tried to have a colonoscopy by Dr. Ardis Hughs but the scope would not pass.  She feels like her abdomen is distended.  No back pain.  Past Medical History:  Diagnosis Date   Arthritis    Cancer ([place])    basal cell skin cancer   Complication of anesthesia    "I went into shock". Decreased body temperature., WILL ONLY HAVE SURGERY WITH SPINAL ANESTHESIA ONLY!!!   Dysrhythmia    Ear infection    Environmental and seasonal allergies    Femoral hernia of right side with obstruction 01/18/2018   GERD (gastroesophageal reflux disease)    no longer having issues takes TUMS occ.   History of kidney stones    Hypotension    Irritant contact dermatitis associated with stoma 06/25/2020   Pneumonia 02/2016   walking pneumonia    Pre-diabetes    RBBB    Sinus infection     Patient Active Problem List   Diagnosis Date Noted   Colonic obstruction ([place]) 12/08/2020   Diverticulosis of colon 10/08/2020   Intractable vomiting 07/06/2020   Hypoxia 07/06/2020   Irritant contact dermatitis associated with stoma 06/25/2020   Diverticulitis 05/21/2020   GERD (gastroesophageal reflux disease)    Acute suppurative peritonitis ([place]) 04/11/2020   Perforated sigmoid colon ([place]) 04/10/2020   Other constipation 04/06/2020   Femoral hernia of right side with obstruction 01/18/2018   Elbow  fracture 07/04/2014    Past Surgical History:  Procedure Laterality Date   CATARACT EXTRACTION     CESAREAN SECTION     CO2 LASER APPLICATION N/A 0/11/2723   Procedure: CO2 LASER APPLICATION;  Surgeon: Vanessa Kick, MD;  Location: [facility];  Service: Gynecology;  Laterality: N/A;   COLON SURGERY     COLONOSCOPY     COLONOSCOPY WITH PROPOFOL N/A 11/11/2020   Procedure: COLONOSCOPY WITH PROPOFOL;  Surgeon: Milus Banister, MD;  Location: [facility];  Service: Endoscopy;  Laterality: N/A;  NEEDS PREVISIT WITH ANESTHESIA   COLOSTOMY     FOOT SURGERY     INGUINAL HERNIA REPAIR Right 01/18/2018   Procedure: OPEN REPAIR INCARCERATED RIGHT FEMORAL HERNIA WITH MESH;  Surgeon: Fanny Skates, MD;  Location: [facility];  Service: General;  Laterality: Right;  GENERAL / TAP BLOCK   KNEE SURGERY     ORIF ELBOW FRACTURE Left 07/04/2014   Procedure: OPEN REDUCTION INTERNAL FIXATION (ORIF) ELBOW/OLECRANON FRACTURE;  Surgeon: Meredith Pel, MD;  Location: [facility];  Service: Orthopedics;  Laterality: Left;   REFRACTIVE SURGERY     VULVECTOMY N/A 08/10/2016   Procedure: WIDE EXCISION VULVECTOMY;  Surgeon: Vanessa Kick, MD;  Location: [facility];  Service: Gynecology;  Laterality: N/A;     OB History     Gravida  4   Para  2   Term  2  Preterm      AB  2   Living         SAB  1   IAB      Ectopic  1   Multiple      Live Births  2           Family History  Problem Relation Age of Onset   Dementia Mother    Kidney cancer Father    Breast cancer Maternal Grandmother    Colon cancer Neg Hx    Esophageal cancer Neg Hx    Pancreatic cancer Neg Hx    Stomach cancer Neg Hx     Social History   Tobacco Use   Smoking status: Former    Packs/day: 0.10    Years: 40.00    Pack years: 4.00    Types: Cigarettes    Quit date: 08/09/2014    Years since quitting: 6.3   Smokeless tobacco: Never  Vaping Use   Vaping Use: Never used  Substance Use Topics   Alcohol use: Yes     Comment: 1-2 beers per week   Drug use: No    Home Medications Prior to Admission medications   Medication Sig Start Date End Date Taking? Authorizing Provider  albuterol (VENTOLIN HFA) 108 (90 Base) MCG/ACT inhaler Inhale 2 puffs into the lungs every 6 (six) hours as needed for wheezing or shortness of breath. 05/21/20   Isaac Bliss, Rayford Halsted, MD  ferrous sulfate 325 (65 FE) MG tablet Take 325 mg by mouth every other day.    [provider]  fexofenadine-pseudoephedrine (ALLEGRA-D) 60-120 MG 12 hr tablet Take 1 tablet by mouth daily as needed (allergies).    [provider]  latanoprost (XALATAN) 0.005 % ophthalmic solution Place 1 drop into both eyes at bedtime.    [provider]  Multiple Vitamin (MULTIVITAMIN WITH MINERALS) TABS tablet Take 1 tablet by mouth daily.     [provider]  Probiotic Product ([place]) Take 1 capsule by mouth daily.    [provider]    Allergies    Iodinated diagnostic agents, Propofol, Shellfish allergy, Vancomycin, Ciprofloxacin, Sulfa antibiotics, Zithromax [azithromycin], Iodine, and Latex  Review of Systems   Review of Systems  Constitutional:  Positive for fever.  Gastrointestinal:  Positive for abdominal pain, constipation, nausea and vomiting. Negative for diarrhea.  All other systems reviewed and are negative.  Physical Exam Updated Vital Signs BP 124/82   Pulse 97   Temp 100.2 F (37.9 C) (Oral)   Resp 14   Ht 5\' 4"  (1.626 m)   Wt 71.2 kg   SpO2 94%   BMI 26.95 kg/m   Physical Exam Vitals and nursing note reviewed.  Constitutional:      Appearance: She is well-developed.  HENT:     Head: Normocephalic and atraumatic.     Right Ear: External ear normal.     Left Ear: External ear normal.     Nose: Nose normal.  Eyes:     General:        Right eye: No discharge.        Left eye: No discharge.  Cardiovascular:     Rate and Rhythm: Regular rhythm. Tachycardia  present.     Heart sounds: Normal heart sounds.     Comments: HR low 100s Pulmonary:     Effort: Pulmonary effort is normal.     Breath sounds: Normal breath sounds.  Abdominal:  Palpations: Abdomen is soft.     Tenderness: There is abdominal tenderness in the right upper quadrant, right lower quadrant, periumbilical area and left lower quadrant.  Skin:    General: Skin is warm and dry.  Neurological:     Mental Status: She is alert.  Psychiatric:        Mood and Affect: Mood is not anxious.    ED Results / Procedures / Treatments   Labs (all labs ordered are listed, but only abnormal results are displayed) Labs Reviewed  COMPREHENSIVE METABOLIC PANEL - Abnormal; Notable for the following components:      Result Value   Potassium 3.1 (*)    Glucose, Bld 111 (*)    All other components within normal limits  RESP PANEL BY RT-PCR (FLU A&B, COVID) ARPGX2  LIPASE, BLOOD  CBC  URINALYSIS, ROUTINE W REFLEX MICROSCOPIC  LACTIC ACID, PLASMA  LACTIC ACID, PLASMA    EKG EKG Interpretation  Date/Time:  Wednesday December 08 2020 09:14:47 EDT Ventricular Rate:  100 PR Interval:  163 QRS Duration: 137 QT Interval:  401 QTC Calculation: 518 R Axis:   -74 Text Interpretation: Sinus tachycardia Consider right atrial enlargement Right bundle branch block overall similar to 2017 besides no PVCs Confirmed by Sherwood Gambler [phone]) on 12/08/2020 9:27:04 AM  Radiology CT ABDOMEN PELVIS WO CONTRAST  Result Date: 12/08/2020 CLINICAL DATA:  Acute generalized abdominal pain. EXAM: CT ABDOMEN AND PELVIS WITHOUT CONTRAST TECHNIQUE: Multidetector CT imaging of the abdomen and pelvis was performed following the standard protocol without IV contrast. COMPARISON:  January 14, 2018. FINDINGS: Lower chest: No acute abnormality. Hepatobiliary: No cholelithiasis is noted. Left and right hepatic pneumobilia is noted, and some degree of pneumobilia was present on prior exam. However, the possibility  of portal venous gas cannot be excluded as there does appear to be more air compared to prior exam. No hepatic parenchymal abnormality is noted on these unenhanced images. Pancreas: Unremarkable. No pancreatic ductal dilatation or surrounding inflammatory changes. Spleen: Normal in size without focal abnormality. Adrenals/Urinary Tract: Adrenal glands are unremarkable. Kidneys are normal, without renal calculi, focal lesion, or hydronephrosis. Bladder is unremarkable. Stomach/Bowel: The stomach is unremarkable. Large duodenal diverticulum is noted. No small bowel dilatation is noted. Colostomy is noted in left lower quadrant. There is moderate to severe dilatation of the more proximal colon, particularly the cecum. There appears to be some degree of pneumatosis involving the cecum. The appendix appears normal. Vascular/Lymphatic: Aortic atherosclerosis. No enlarged abdominal or pelvic lymph nodes. Reproductive: Uterus and bilateral adnexa are unremarkable. Other: No ascites is noted.  Small periumbilical hernia is noted. Musculoskeletal: No acute or significant osseous findings. 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 There is moderate to severe dilatation of the more proximal colon, particularly the cecum. No definite transition zone is noted to suggest obstruction. There is noted possible intramural gas involving a portion of the cecum. While there was some degree pneumobilia present on the prior exam, there is an increased amount of air, including the non dependent portion of the left hepatic lobe, and the possibility of portal venous gas cannot can not be excluded. Therefore, the possibility of ischemic bowel cannot be excluded. Critical Value/emergent results were called by telephone at the time of interpretation on 12/08/2020 at 11:04 am to provider Sherwood Gambler , who verbally acknowledged these results. Aortic Atherosclerosis (ICD10-I70.0). Electronically Signed    By: Marijo Conception M.D.   On: 12/08/2020 11:04  Procedures Procedures   Medications Ordered in ED Medications  lactated ringers infusion ( Intravenous New Bag/Given 12/08/20 1137)  sodium chloride 0.9 % bolus 1,000 mL (0 mLs Intravenous Stopped 12/08/20 1131)  morphine 4 MG/ML injection 4 mg (4 mg Intravenous Given 12/08/20 0955)  ondansetron (ZOFRAN) injection 4 mg (4 mg Intravenous Given 12/08/20 0955)    ED Course  I have reviewed the triage vital signs and the nursing notes.  Pertinent labs & imaging results that were available during my care of the patient were reviewed by me and considered in my medical decision making (see chart for details).    MDM Rules/Calculators/A&P                           Patient is feeling better with treatment.  She is not vomiting in the emergency department.  CT shows dilated cecum and there is concern for possible ischemic bowel.  We will add on lactate.  Discussed with general surgery, who recommends admission to [facility] rather than [facility] and admit to the medical surgical floor.  No NG for now unless she vomits.  No antibiotics for now.  We will continue IV fluids.  1:30 PM Update: patient will go to OR at [facility]. [facility] is transferring Final Clinical Impression(s) / ED Diagnoses Final diagnoses:  Colonic obstruction [facility])    Rx / DC Orders ED Discharge Orders     None        Sherwood Gambler, MD 12/08/20 1152    Sherwood Gambler, MD 12/08/20 1330

## 2020-12-08 NOTE — Transfer of Care (Signed)
Immediate Anesthesia Transfer of Care Note  Patient: Amber Klein  Procedure(s) Performed: Joylene Igo DILATATION WITH STOMAPLASTY  Patient Location: PACU  Anesthesia Type:General  Level of Consciousness: awake and alert   Airway & Oxygen Therapy: Patient Spontanous Breathing and Patient connected to face mask oxygen  Post-op Assessment: Report given to RN and Post -op Vital signs reviewed and stable  Post vital signs: Reviewed and stable  Last Vitals:  Vitals Value Taken Time  BP 107/58 12/08/20 1517  Temp    Pulse 78 12/08/20 1519  Resp 11 12/08/20 1519  SpO2 100 % 12/08/20 1519  Vitals shown include unvalidated device data.  Last Pain:  Vitals:   12/08/20 1409  TempSrc: Oral  PainSc: 5          Complications: No notable events documented.

## 2020-12-08 NOTE — ED Notes (Signed)
Patient transported to CT 

## 2020-12-08 NOTE — ED Notes (Signed)
All belongings and paperwork sent w/ [facility].

## 2020-12-08 NOTE — Anesthesia Preprocedure Evaluation (Addendum)
Anesthesia Evaluation  Patient identified by MRN, date of birth, ID band Patient awake    Reviewed: Allergy & Precautions, NPO status , Patient's Chart, lab work & pertinent test results  History of Anesthesia Complications (+) PROLONGED EMERGENCE and history of anesthetic complications ("I went into shock". Decreased body temperature., WILL ONLY HAVE SURGERY WITH SPINAL ANESTHESIA ONLY!!!)  Airway Mallampati: I  TM Distance: >3 FB Neck ROM: Full    Dental  (+) Teeth Intact, Dental Advisory Given, Caps, Partial Upper   Pulmonary former smoker,    Pulmonary exam normal breath sounds clear to auscultation       Cardiovascular Exercise Tolerance: Good hypertension, Normal cardiovascular exam+ dysrhythmias (RBBB)  Rhythm:Regular Rate:Normal     Neuro/Psych negative neurological ROS  negative psych ROS   GI/Hepatic Neg liver ROS, GERD  Medicated,RIH   Endo/Other  negative endocrine ROS  Renal/GU negative Renal ROS     Musculoskeletal  (+) Arthritis , Osteoarthritis,    Abdominal   Peds  Hematology negative hematology ROS (+)   Anesthesia Other Findings Day of surgery medications reviewed with the patient.  Reproductive/Obstetrics                          Anesthesia Physical  Anesthesia Plan  ASA: 3 and emergent  Anesthesia Plan: General   Post-op Pain Management:    Induction: Intravenous  PONV Risk Score and Plan: 3 and Ondansetron, Dexamethasone, Treatment may vary due to age or medical condition and TIVA  Airway Management Planned: Oral ETT and LMA  Additional Equipment: None  Intra-op Plan:   Post-operative Plan: Extubation in OR  Informed Consent: I have reviewed the patients History and Physical, chart, labs and discussed the procedure including the risks, benefits and alternatives for the proposed anesthesia with the patient or authorized representative who has indicated  his/her understanding and acceptance.     Dental advisory given  Plan Discussed with: CRNA and Anesthesiologist  Anesthesia Plan Comments: ( )     Anesthesia Quick Evaluation

## 2020-12-09 ENCOUNTER — Inpatient Hospital Stay (HOSPITAL_COMMUNITY): Payer: 59

## 2020-12-09 ENCOUNTER — Encounter (HOSPITAL_COMMUNITY): Payer: Self-pay | Admitting: Surgery

## 2020-12-09 ENCOUNTER — Ambulatory Visit: Payer: Self-pay | Admitting: General Surgery

## 2020-12-09 LAB — BASIC METABOLIC PANEL
Anion gap: 3 — ABNORMAL LOW (ref 5–15)
BUN: 8 mg/dL (ref 8–23)
CO2: 28 mmol/L (ref 22–32)
Calcium: 8.5 mg/dL — ABNORMAL LOW (ref 8.9–10.3)
Chloride: 103 mmol/L (ref 98–111)
Creatinine, Ser: 0.64 mg/dL (ref 0.44–1.00)
GFR, Estimated: >60 mL/min (ref >60)
Glucose, Bld: 144 mg/dL — ABNORMAL HIGH (ref 70–99)
Potassium: 3.6 mmol/L (ref 3.5–5.1)
Sodium: 134 mmol/L — ABNORMAL LOW (ref 135–145)

## 2020-12-09 LAB — CBC
HCT: 34.6 % — ABNORMAL LOW (ref 36.0–46.0)
Hemoglobin: 11 g/dL — ABNORMAL LOW (ref 12.0–15.0)
MCH: 29.2 pg (ref 26.0–34.0)
MCHC: 31.8 g/dL (ref 30.0–36.0)
MCV: 91.8 fL (ref 80.0–100.0)
Platelets: 244 10*3/uL (ref 150–400)
RBC: 3.77 MIL/uL — ABNORMAL LOW (ref 3.87–5.11)
RDW: 13.9 % (ref 11.5–15.5)
WBC: 7.4 10*3/uL (ref 4.0–10.5)
nRBC: 0 % (ref 0.0–0.2)

## 2020-12-09 MED ORDER — ACETAMINOPHEN 325 MG PO TABS: 650.0000 mg | ORAL_TABLET | Freq: Four times a day (QID) | ORAL | Status: DC | PRN

## 2020-12-09 MED ORDER — OXYCODONE HCL 5 MG PO TABS: 5.0000 mg | ORAL_TABLET | Freq: Four times a day (QID) | ORAL | 0 refills | Status: DC | PRN

## 2020-12-09 MED ORDER — POTASSIUM CHLORIDE CRYS ER 20 MEQ PO TBCR
40.0000 meq | EXTENDED_RELEASE_TABLET | Freq: Once | ORAL | Status: AC
  Administered 2020-12-09: 40 meq via ORAL
  Filled 2020-12-09: qty 2

## 2020-12-09 MED ORDER — POLYETHYLENE GLYCOL 3350 17 G PO PACK
17.0000 g | PACK | Freq: Two times a day (BID) | ORAL | 0 refills | Status: DC | PRN
Start: 2020-12-09 — End: 2021-01-27

## 2020-12-09 NOTE — Discharge Summary (Signed)
[facility] Discharge Summary   Patient ID: Amber Klein MRN: [id number] DOB/AGE: March 21, 1950 70 y.o.  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 EXAM: CT ABDOMEN AND PELVIS WITHOUT CONTRAST TECHNIQUE: Multidetector CT imaging of the abdomen and pelvis was performed following the standard protocol without IV contrast. COMPARISON:  January 14, 2018. FINDINGS: Lower chest: No acute abnormality. Hepatobiliary: No cholelithiasis is noted. Left and right hepatic pneumobilia is noted, and some degree of pneumobilia was present on prior exam. However, the possibility of portal venous gas cannot be excluded as there does appear to be more air compared to prior exam. No hepatic parenchymal abnormality is noted on these unenhanced images. Pancreas: Unremarkable. No pancreatic ductal dilatation or surrounding inflammatory changes. Spleen: Normal in size without focal abnormality. Adrenals/Urinary Tract: Adrenal glands are unremarkable. Kidneys are normal, without renal calculi, focal lesion, or hydronephrosis. Bladder is unremarkable. Stomach/Bowel: The stomach is unremarkable. Large duodenal diverticulum is noted. No small bowel dilatation is noted. Colostomy is noted in left lower quadrant. There is moderate to severe dilatation of the more proximal colon, particularly the cecum. There appears to be some degree of pneumatosis involving the cecum. The appendix appears normal. Vascular/Lymphatic: Aortic atherosclerosis. No enlarged abdominal or pelvic lymph nodes. Reproductive: Uterus and bilateral adnexa are unremarkable. Other: No ascites is noted.  Small periumbilical hernia is noted. Musculoskeletal: No acute or  significant osseous findings. 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 There is moderate to severe dilatation of the more proximal colon, particularly the cecum. No definite transition zone is noted to suggest obstruction. There is noted possible intramural gas involving a portion of the cecum. While there was some degree pneumobilia present on the prior exam, there is an increased amount of air, including the non dependent portion of the left hepatic lobe, and the possibility of portal venous gas cannot can not be excluded. Therefore, the possibility of ischemic bowel cannot be excluded. Critical Value/emergent results were called by telephone at the time of interpretation on 12/08/2020 at 11:04 am to provider Sherwood Gambler , who verbally acknowledged these results. Aortic Atherosclerosis (ICD10-I70.0). Electronically Signed   By: Marijo Conception M.D.   On: 12/08/2020 11:04   DG Abd 1 View  Result Date: 12/09/2020 CLINICAL DATA:  Colonic obstruction. EXAM: ABDOMEN - 1 VIEW COMPARISON:  12/08/2020 FINDINGS: Colonic distension within the left hemiabdomen. Persistent gaseous distension of the ascending colon which measures up to 5.9 cm, compared with 7.4 cm previously. Pneumobilia is again identified. IMPRESSION: 1. Improving colonic obstruction pattern. 2. Pneumobilia. Electronically Signed   By: Kerby Moors M.D.   On: 12/09/2020 08:10    Procedures Dr. Johnathan Hausen (12/08/20) - stomal dilation with evacuation and stomaplasty  Hospital Course:  Patient is a 70 year old female who presented to [facility] with abdominal pain and decreased colostomy output.  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 She has been followed by Dr. Leighton Ruff in our office for consideration of colostomy reversal. Workup showed colonic obstruction.  Patient was admitted and underwent procedure listed above.  Tolerated procedure well  and was transferred to the floor.  Diet was advanced as tolerated to liquids.  On POD1,  the patient was voiding well, tolerating diet, ambulating well, pain well controlled, vital signs stable and felt stable for discharge home.  Patient will follow up with Dr. Marcello Moores for colostomy reversal and our office is working on getting this scheduled.   I or a member of my team have reviewed this patient in the Controlled Substance Database.   Allergies as of 12/09/2020       Reactions   Iodinated Diagnostic Agents Anaphylaxis   Shellfish Allergy Anaphylaxis   Vancomycin Anaphylaxis   Ciprofloxacin Hives, Swelling   Hives   Sulfa Antibiotics Swelling, Other (See Comments)   Migraine   Zithromax [azithromycin] Hives   Iodine    UNSPECIFIED REACTION    Latex Rash   Sensitivity on too long   Propofol    Couldn't wake patient up - needs lower doses         Medication List     TAKE these medications    acetaminophen 325 MG tablet Commonly known as: TYLENOL Take 2 tablets (650 mg total) by mouth every 6 (six) hours as needed for mild pain (or temp > 100).   albuterol 108 (90 Base) MCG/ACT inhaler Commonly known as: VENTOLIN HFA Inhale 2 puffs into the lungs every 6 (six) hours as needed for wheezing or shortness of breath.   fexofenadine-pseudoephedrine 60-120 MG 12 hr tablet Commonly known as: ALLEGRA-D Take 1 tablet by mouth daily as needed (allergies).   latanoprost 0.005 % ophthalmic solution Commonly known as: XALATAN Place 1 drop into both eyes daily.   multivitamin with minerals Tabs tablet Take 1 tablet by mouth daily.   oxyCODONE 5 MG immediate release tablet Commonly known as: Oxy IR/ROXICODONE Take 1 tablet (5 mg total) by mouth every 6 (six) hours as needed for moderate pain.   PHILLIPS COLON HEALTH PO Take 1 capsule by mouth daily.   polyethylene glycol 17 g packet Commonly known as: MiraLax Take 17 g by mouth 2 (two) times daily as needed for mild  constipation.          Follow-up Information     Leighton Ruff, MD Follow up.   Specialties: General Surgery, Colon and Rectal Surgery Why: The office is working to schedule your surgery. Please let us know if you are having more obstructive symptoms prior to this. Contact information: [address] [place] [address] [phone]                 Signed: Norm Parcel , [facility] 12/09/2020, 10:44 AM Please see Amion for pager number during day hours 7:00am-4:30pm

## 2020-12-09 NOTE — Progress Notes (Signed)
Progress Note  1 Day Post-Op  Subjective: Pt reports she is feeling much improved this AM. She has had a lot of gas from stoma as well as stool. She is tolerating liquids without nausea or vomiting. She denies abdominal pain.   Objective: Vital signs in last 24 hours: Temp:  [98.2 F (36.8 C)-99.5 F (37.5 C)] 98.6 F (37 C) (10/27 0932) Pulse Rate:  [65-102] 65 (10/27 0932) Resp:  [10-20] 18 (10/27 0932) BP: (87-133)/(43-97) 113/63 (10/27 0932) SpO2:  [90 %-100 %] 95 % (10/27 0932) Weight:  [71.2 kg] 71.2 kg (10/26 1409) Last BM Date: 12/08/20  Intake/Output from previous day: 10/26 0701 - 10/27 0700 In: 3250 [P.O.:580; I.V.:1670; IV Piggyback:1000] Out: 600 [Stool:600] Intake/Output this shift: No intake/output data recorded.  PE: General: pleasant, WD, WN female who is laying in bed in NAD Heart: regular, rate, and rhythm.  Abd: soft, mild ttp right around stoma, abdomen otherwise NT, no distention, midline surgical scar well healed, stoma in LLQ with soft brown stool and gas in ostomy appliance MS: all 4 extremities are symmetrical with no cyanosis, clubbing, or edema. Skin: warm and dry with no masses, lesions, or rashes Neuro: Cranial nerves 2-12 grossly intact, sensation is normal throughout Psych: A&Ox3 with an appropriate affect.   Lab Results:  Recent Labs    12/08/20 0923 12/09/20 0430  WBC 9.1 7.4  HGB 13.7 11.0*  HCT 42.4 34.6*  PLT 298 244   BMET Recent Labs    12/08/20 0923 12/09/20 0430  NA 139 134*  K 3.1* 3.6  CL 103 103  CO2 26 28  GLUCOSE 111* 144*  BUN 10 8  CREATININE 0.80 0.64  CALCIUM 9.4 8.5*   PT/INR No results for input(s): LABPROT, INR in the last 72 hours. CMP     Component Value Date/Time   NA 134 (L) 12/09/2020 0430   K 3.6 12/09/2020 0430   CL 103 12/09/2020 0430   CO2 28 12/09/2020 0430   GLUCOSE 144 (H) 12/09/2020 0430   BUN 8 12/09/2020 0430   CREATININE 0.64 12/09/2020 0430   CALCIUM 8.5 (L) 12/09/2020  0430   PROT 6.6 12/08/2020 0923   ALBUMIN 3.9 12/08/2020 0923   AST 17 12/08/2020 0923   ALT 21 12/08/2020 0923   ALKPHOS 69 12/08/2020 0923   BILITOT 0.4 12/08/2020 0923   GFRNONAA >60 12/09/2020 0430   GFRAA >60 01/18/2018 1116   Lipase     Component Value Date/Time   LIPASE 18 12/08/2020 0923       Studies/Results: CT ABDOMEN PELVIS WO CONTRAST  Result Date: 12/08/2020 CLINICAL DATA:  Acute generalized abdominal pain. EXAM: CT ABDOMEN AND PELVIS WITHOUT CONTRAST TECHNIQUE: Multidetector CT imaging of the abdomen and pelvis was performed following the standard protocol without IV contrast. COMPARISON:  January 14, 2018. FINDINGS: Lower chest: No acute abnormality. Hepatobiliary: No cholelithiasis is noted. Left and right hepatic pneumobilia is noted, and some degree of pneumobilia was present on prior exam. However, the possibility of portal venous gas cannot be excluded as there does appear to be more air compared to prior exam. No hepatic parenchymal abnormality is noted on these unenhanced images. Pancreas: Unremarkable. No pancreatic ductal dilatation or surrounding inflammatory changes. Spleen: Normal in size without focal abnormality. Adrenals/Urinary Tract: Adrenal glands are unremarkable. Kidneys are normal, without renal calculi, focal lesion, or hydronephrosis. Bladder is unremarkable. Stomach/Bowel: The stomach is unremarkable. Large duodenal diverticulum is noted. No small bowel dilatation is noted. Colostomy is noted in  left lower quadrant. There is moderate to severe dilatation of the more proximal colon, particularly the cecum. There appears to be some degree of pneumatosis involving the cecum. The appendix appears normal. Vascular/Lymphatic: Aortic atherosclerosis. No enlarged abdominal or pelvic lymph nodes. Reproductive: Uterus and bilateral adnexa are unremarkable. Other: No ascites is noted.  Small periumbilical hernia is noted. Musculoskeletal: No acute or significant  osseous findings. 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 There is moderate to severe dilatation of the more proximal colon, particularly the cecum. No definite transition zone is noted to suggest obstruction. There is noted possible intramural gas involving a portion of the cecum. While there was some degree pneumobilia present on the prior exam, there is an increased amount of air, including the non dependent portion of the left hepatic lobe, and the possibility of portal venous gas cannot can not be excluded. Therefore, the possibility of ischemic bowel cannot be excluded. Critical Value/emergent results were called by telephone at the time of interpretation on 12/08/2020 at 11:04 am to provider Sherwood Gambler , who verbally acknowledged these results. Aortic Atherosclerosis (ICD10-I70.0). Electronically Signed   By: Marijo Conception M.D.   On: 12/08/2020 11:04   DG Abd 1 View  Result Date: 12/09/2020 CLINICAL DATA:  Colonic obstruction. EXAM: ABDOMEN - 1 VIEW COMPARISON:  12/08/2020 FINDINGS: Colonic distension within the left hemiabdomen. Persistent gaseous distension of the ascending colon which measures up to 5.9 cm, compared with 7.4 cm previously. Pneumobilia is again identified. IMPRESSION: 1. Improving colonic obstruction pattern. 2. Pneumobilia. Electronically Signed   By: Kerby Moors M.D.   On: 12/09/2020 08:10    Anti-infectives: Anti-infectives (From admission, onward)    Start     Dose/Rate Route Frequency Ordered Stop   12/08/20 1315  cefoTEtan (CEFOTAN) 2 g in sodium chloride 0.9 % 100 mL IVPB  Status:  Discontinued        2 g 200 mL/hr over 30 Minutes Intravenous On call to O.R. 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 No definite transition zone is noted to suggest obstruction. There is noted possible intramural gas involving a portion of the cecum POD1 stomal dilation and evacuation with stomaplasty 11/28/20 Dr. Hassell Done - patient has had increase in gas and stool from ostomy pouch overnight and is feeling much better this AM - she is tolerating CLD - Dr. Marcello Moores to see this afternoon and determine operative plan   FEN: CLD, IVF to [facility] VTE: SCDs, LMWH ID: [email or web address] pre-op  LOS: 1 day    Norm Parcel, [facility] Surgery 12/09/2020, 9:34 AM Please see Amion for pager number during day hours 7:00am-4:30pm

## 2020-12-14 ENCOUNTER — Telehealth: Payer: Self-pay

## 2020-12-14 NOTE — Telephone Encounter (Signed)
Transition Care Management Unsuccessful Follow-up Telephone Call  Date of discharge and from where:  12/09/20 from [facility]  Attempts:  1st Attempt  Reason for unsuccessful TCM follow-up call:  Voice mail full   Thea Silversmith, RN, MSN, BSN, [place] Management Coordinator [phone]

## 2020-12-15 ENCOUNTER — Other Ambulatory Visit: Payer: Self-pay | Admitting: Internal Medicine

## 2020-12-15 DIAGNOSIS — K219 Gastro-esophageal reflux disease without esophagitis: Secondary | ICD-10-CM

## 2020-12-16 ENCOUNTER — Telehealth: Payer: Self-pay

## 2020-12-16 NOTE — Telephone Encounter (Signed)
Transition Care Management Follow-up Telephone Call Date of discharge and from where: 12/09/2020  [facility] How have you been since you were released from the hospital? "Doing great, back to work" Any questions or concerns? Yes  Patient had questions about sutures and if they were dissolvable. Encouraged patient to call surgeon.  Items Reviewed: Did the pt receive and understand the discharge instructions provided? Yes  Medications obtained and verified? Yes  Other? No  Any new allergies since your discharge? No  Dietary orders reviewed? Yes Do you have support at home? Yes   Home Care and Equipment/Supplies: Were home health services ordered? no If so, what is the name of the agency?  Has the agency set up a time to come to the patient's home?  Were any new equipment or medical supplies ordered?  What is the name of the medical supply agency?  Were you able to get the supplies/equipment?  Do you have any questions related to the use of the equipment or supplies?   Functional Questionnaire: (I = Independent and D = Dependent) ADLs: I  Bathing/Dressing- I  Meal Prep- I  Eating- I  Maintaining continence- I  Transferring/Ambulation- I  Managing Meds- I  Follow up appointments reviewed:  PCP Hospital f/u appt confirmed? No  Specialist Hospital f/u appt confirmed? No  Patient is working with scheduler for schedule surgery Are transportation arrangements needed? No  If their condition worsens, is the pt aware to call PCP or go to the Emergency Dept.? Yes Was the patient provided with contact information for the PCP's office or ED? Yes Was to pt encouraged to call back with questions or concerns? Yes  Tomasa Rand, RN, BSN, CEN [facility] [employer] [phone]

## 2020-12-29 ENCOUNTER — Encounter (HOSPITAL_COMMUNITY): Payer: Self-pay

## 2020-12-29 ENCOUNTER — Encounter (HOSPITAL_COMMUNITY)
Admission: RE | Admit: 2020-12-29 | Discharge: 2020-12-29 | Disposition: A | Discharge status: Home / Self Care | Payer: 59 | Source: Ambulatory Visit | Attending: General Surgery | Admitting: General Surgery

## 2020-12-29 ENCOUNTER — Other Ambulatory Visit: Payer: Self-pay

## 2020-12-29 VITALS — BP 131/77 | HR 87 | Temp 98.8°F | Resp 18 | Ht 64.5 in | Wt 161.0 lb

## 2020-12-29 DIAGNOSIS — R7303 Prediabetes: Secondary | ICD-10-CM | POA: Insufficient documentation

## 2020-12-29 DIAGNOSIS — Z01812 Encounter for preprocedural laboratory examination: Secondary | ICD-10-CM | POA: Diagnosis present

## 2020-12-29 LAB — CBC
HCT: 45.9 % (ref 36.0–46.0)
Hemoglobin: 14.2 g/dL (ref 12.0–15.0)
MCH: 28.6 pg (ref 26.0–34.0)
MCHC: 30.9 g/dL (ref 30.0–36.0)
MCV: 92.4 fL (ref 80.0–100.0)
Platelets: 209 10*3/uL (ref 150–400)
RBC: 4.97 MIL/uL (ref 3.87–5.11)
RDW: 14.1 % (ref 11.5–15.5)
WBC: 4.9 10*3/uL (ref 4.0–10.5)
nRBC: 0 % (ref 0.0–0.2)

## 2020-12-29 LAB — BASIC METABOLIC PANEL
Anion gap: 7 (ref 5–15)
BUN: 19 mg/dL (ref 8–23)
CO2: 30 mmol/L (ref 22–32)
Calcium: 10 mg/dL (ref 8.9–10.3)
Chloride: 99 mmol/L (ref 98–111)
Creatinine, Ser: 0.81 mg/dL (ref 0.44–1.00)
GFR, Estimated: >60 mL/min (ref >60)
Glucose, Bld: 104 mg/dL — ABNORMAL HIGH (ref 70–99)
Potassium: 4.8 mmol/L (ref 3.5–5.1)
Sodium: 136 mmol/L (ref 135–145)

## 2020-12-29 NOTE — Patient Instructions (Signed)
DUE TO COVID-19 ONLY ONE VISITOR IS ALLOWED TO COME WITH YOU AND STAY IN THE WAITING ROOM ONLY DURING PRE OP AND PROCEDURE DAY OF SURGERY IF YOU ARE GOING HOME AFTER SURGERY. IF YOU ARE SPENDING THE NIGHT 2 PEOPLE MAY VISIT WITH YOU IN YOUR PRIVATE ROOM AFTER SURGERY UNTIL VISITING  HOURS ARE OVER AT 8:00 PM AND 1  VISITOR  MAY  SPEND THE NIGHT.   YOU NEED TO HAVE A COVID 19 TEST ON_11/21__ THIS TEST MUST BE DONE BEFORE SURGERY,  COVID TESTING SITE  IS LOCATED AT [place]. REMAIN IN YOUR CAR THIS IS A DRIVE UP TEST. AFTER YOUR COVID TEST PLEASE WEAR A MASK OUT IN PUBLIC AND SOCIAL DISTANCE AND [place] YOUR HANDS FREQUENTLY, ALSO ASK ALL YOUR CLOSE CONTACT PERSONS TO WEAR A MASK AND SOCIAL DISTANCE AND [place] THEIR HANDS FREQUENTLY ALSO.               Amber Klein     Your procedure is scheduled on: 01/05/21   Report to [facility] Main  Entrance   Report to admitting at  6:15 AM     Call this number if you have problems the morning of surgery [phone]    DRINK 2 PRESURGERY ENSURE DRINKS THE NIGHT BEFORE SURGERY AT 10:00 PM  NO SOLIDS AFTER MIDNIGHT THE DAY PRIOR TO THE SURGERY.   NOTHING BY MOUTH EXCEPT CLEAR LIQUIDS UNTIL THREE HOURS PRIOR TO SCHEDULED SURGERY. PLEASE FINISH PRESURGERY ENSURE DRINK PER SURGEON ORDER 3 HOURS PRIOR TO SCHEDULED SURGERY TIME WHICH NEEDS TO BE COMPLETED AT ___5:30______.     CLEAR LIQUID DIET   Foods Allowed                                                                     Foods Excluded  Coffee and tea, regular and decaf                             liquids that you cannot  Plain Jell-O any favor except red or purple                                           see through such as: Fruit ices (not with fruit pulp)                                     milk, soups, orange juice  Iced Popsicles                                    All solid food Carbonated beverages, regular and diet                                     Cranberry, grape and apple juices Sports drinks like Gatorade Lightly seasoned clear broth or consume(fat free) Sugar  Sample Menu  Breakfast                                Lunch                                     Supper Cranberry juice                    Beef broth                            Chicken broth Jell-O                                     Grape juice                           Apple juice Coffee or tea                        Jell-O                                      Popsicle                                                Coffee or tea                        Coffee or tea  _____________________________________________________________________   BRUSH YOUR TEETH MORNING OF SURGERY AND RINSE YOUR MOUTH OUT, NO CHEWING GUM CANDY OR MINTS.     Take these medicines the morning of surgery with A SIP OF WATER: Inhaler, eye drops                                You may not have any metal on your body including hair pins and              piercings  Do not wear jewelry, make-up, lotions, powders or perfumes, deodorant             Do not wear nail polish on your fingernails.  Do not shave  48 hours prior to surgery.                Do not bring valuables to the hospital. [place].  Contacts, dentures or bridgework may not be worn into surgery.  Leave suitcase in the car. After surgery it may be brought to your room.               [facility] - Preparing for Surgery Before surgery, you can play an important role.  Because skin is not sterile, your skin needs to be as free of germs as possible.  You can reduce the number of germs on your skin by washing with CHG (chlorahexidine gluconate)  soap before surgery.  CHG is an antiseptic cleaner which kills germs and bonds with the skin to continue killing germs even after washing. Please DO NOT use if you have an allergy to CHG or antibacterial soaps.  If your skin becomes reddened/irritated  stop using the CHG and inform your nurse when you arrive at Short Stay. Do not shave (including legs and underarms) for at least 48 hours prior to the first CHG shower.    Please follow these instructions carefully:  1.  Shower with CHG Soap the night before surgery and the  morning of Surgery.  2.  If you choose to wash your hair, wash your hair first as usual with your  normal  shampoo.  3.  After you shampoo, rinse your hair and body thoroughly to remove the  shampoo.                            4.  Use CHG as you would any other liquid soap.  You can apply chg directly  to the skin and wash                       Gently with a scrungie or clean washcloth.  5.  Apply the CHG Soap to your body ONLY FROM THE NECK DOWN.   Do not use on face/ open                           Wound or open sores. Avoid contact with eyes, ears mouth and genitals (private parts).                       Wash face,  Genitals (private parts) with your normal soap.             6.  Wash thoroughly, paying special attention to the area where your surgery  will be performed.  7.  Thoroughly rinse your body with warm water from the neck down.  8.  DO NOT shower/wash with your normal soap after using and rinsing off  the CHG Soap.                9.  Pat yourself dry with a clean towel.            10.  Wear clean pajamas.            11.  Place clean sheets on your bed the night of your first shower and do not  sleep with pets. Day of Surgery : Do not apply any lotions/deodorants the morning of surgery.  Please wear clean clothes to the hospital/surgery center.  FAILURE TO FOLLOW THESE INSTRUCTIONS MAY RESULT IN THE CANCELLATION OF YOUR SURGERY PATIENT SIGNATURE_________________________________  NURSE SIGNATURE__________________________________  ________________________________________________________________________   Amber Klein  An incentive spirometer is a tool that can help keep your lungs clear and active.  This tool measures how well you are filling your lungs with each breath. Taking long deep breaths may help reverse or decrease the chance of developing breathing (pulmonary) problems (especially infection) following: A long period of time when you are unable to move or be active. BEFORE THE PROCEDURE  If the spirometer includes an indicator to show your best effort, your nurse or respiratory therapist will set it to a desired goal. If possible, sit up straight or lean slightly forward. Try not to slouch. Hold  the incentive spirometer in an upright position. INSTRUCTIONS FOR USE  Sit on the edge of your bed if possible, or sit up as far as you can in bed or on a chair. Hold the incentive spirometer in an upright position. Breathe out normally. Place the mouthpiece in your mouth and seal your lips tightly around it. Breathe in slowly and as deeply as possible, raising the piston or the ball toward the top of the column. Hold your breath for 3-5 seconds or for as long as possible. Allow the piston or ball to fall to the bottom of the column. Remove the mouthpiece from your mouth and breathe out normally. Rest for a few seconds and repeat Steps 1 through 7 at least 10 times every 1-2 hours when you are awake. Take your time and take a few normal breaths between deep breaths. The spirometer may include an indicator to show your best effort. Use the indicator as a goal to work toward during each repetition. After each set of 10 deep breaths, practice coughing to be sure your lungs are clear. If you have an incision (the cut made at the time of surgery), support your incision when coughing by placing a pillow or rolled up towels firmly against it. Once you are able to get out of bed, walk around indoors and cough well. You may stop using the incentive spirometer when instructed by your caregiver.  RISKS AND COMPLICATIONS Take your time so you do not get dizzy or light-headed. If you are in pain, you may  need to take or ask for pain medication before doing incentive spirometry. It is harder to take a deep breath if you are having pain. AFTER USE Rest and breathe slowly and easily. It can be helpful to keep track of a log of your progress. Your caregiver can provide you with a simple table to help with this. If you are using the spirometer at home, follow these instructions: [place] IF:  You are having difficultly using the spirometer. You have trouble using the spirometer as often as instructed. Your pain medication is not giving enough relief while using the spirometer. You develop fever of 100.5 F (38.1 C) or higher. SEEK IMMEDIATE MEDICAL CARE IF:  You cough up bloody sputum that had not been present before. You develop fever of 102 F (38.9 C) or greater. You develop worsening pain at or near the incision site. MAKE SURE YOU:  Understand these instructions. Will watch your condition. Will get help right away if you are not doing well or get worse. Document Released: 06/12/2006 Document Revised: 04/24/2011 Document Reviewed: 08/13/2006 [facility] Patient Information 2014 [place], [place].   ________________________________________________________________________

## 2020-12-29 NOTE — Progress Notes (Signed)
COVID test- 11/21   PCP - Dr. Ethlyn Gallery Cardiologist - none  Chest x-ray - 04/27/20 - CE EKG - 12/08/20-epic Stress Test - no ECHO - no Cardiac Cath - NA Pacemaker/ICD device last checked:NA  Sleep Study - no CPAP -   Fasting Blood Sugar - Pt reports that her Dr. Michela Pitcher that she is not pre-diabetic Checks Blood Sugar _____ times a day  Blood Thinner Instructions:NA Aspirin Instructions: Last Dose:  Anesthesia review: no  Patient denies shortness of breath, fever, cough and chest pain at PAT appointment Pt has no SOB with any activities  Patient verbalized understanding of instructions that were given to them at the PAT appointment. Patient was also instructed that they will need to review over the PAT instructions again at home before surgery. yes

## 2021-01-03 ENCOUNTER — Other Ambulatory Visit: Payer: Self-pay | Admitting: General Surgery

## 2021-01-03 LAB — SARS CORONAVIRUS 2 (TAT 6-24 HRS): SARS Coronavirus 2: NEGATIVE

## 2021-01-04 NOTE — Anesthesia Preprocedure Evaluation (Addendum)
Anesthesia Evaluation    Reviewed: Allergy & Precautions, Patient's Chart, lab work & pertinent test results  History of Anesthesia Complications (+) PROLONGED EMERGENCE and history of anesthetic complications ( Decreased body temperature.)  Airway Mallampati: I  TM Distance: >3 FB Neck ROM: Full    Dental  (+) Teeth Intact, Dental Advisory Given, Caps, Partial Upper   Pulmonary former smoker,    Pulmonary exam normal breath sounds clear to auscultation       Cardiovascular Exercise Tolerance: Good hypertension, Pt. on medications Normal cardiovascular exam+ dysrhythmias (RBBB)  Rhythm:Regular Rate:Normal     Neuro/Psych negative neurological ROS  negative psych ROS   GI/Hepatic Neg liver ROS, GERD  Medicated,RIH   Endo/Other  negative endocrine ROS  Renal/GU negative Renal ROS     Musculoskeletal  (+) Arthritis , Osteoarthritis,    Abdominal   Peds  Hematology negative hematology ROS (+)   Anesthesia Other Findings Day of surgery medications reviewed with the patient.  Reproductive/Obstetrics                          Anesthesia Physical  Anesthesia Plan  ASA: 3  Anesthesia Plan: General   Post-op Pain Management: Ofirmev IV (intra-op)   Induction: Intravenous  PONV Risk Score and Plan: 3 and Ondansetron, Dexamethasone and Treatment may vary due to age or medical condition  Airway Management Planned: Oral ETT  Additional Equipment: None  Intra-op Plan:   Post-operative Plan: Extubation in OR  Informed Consent: I have reviewed the patients History and Physical, chart, labs and discussed the procedure including the risks, benefits and alternatives for the proposed anesthesia with the patient or authorized representative who has indicated his/her understanding and acceptance.     Dental advisory given  Plan Discussed with: CRNA and Anesthesiologist  Anesthesia Plan  Comments: ( )       Anesthesia Quick Evaluation

## 2021-01-05 ENCOUNTER — Encounter (HOSPITAL_COMMUNITY)
Admission: RE | Disposition: A | Discharge status: Home / Self Care | Payer: Self-pay | Source: Home / Self Care | Attending: General Surgery

## 2021-01-05 ENCOUNTER — Inpatient Hospital Stay (HOSPITAL_COMMUNITY): Payer: 59 | Admitting: Certified Registered Nurse Anesthetist

## 2021-01-05 ENCOUNTER — Inpatient Hospital Stay (HOSPITAL_COMMUNITY)
Admission: RE | Admit: 2021-01-05 | Discharge: 2021-01-09 | DRG: 337 | Disposition: A | Discharge status: Home / Self Care | Payer: 59 | Attending: Surgery | Admitting: Surgery

## 2021-01-05 ENCOUNTER — Other Ambulatory Visit: Payer: Self-pay

## 2021-01-05 ENCOUNTER — Encounter (HOSPITAL_COMMUNITY): Payer: Self-pay | Admitting: General Surgery

## 2021-01-05 DIAGNOSIS — Z888 Allergy status to other drugs, medicaments and biological substances status: Secondary | ICD-10-CM

## 2021-01-05 DIAGNOSIS — Z91040 Latex allergy status: Secondary | ICD-10-CM

## 2021-01-05 DIAGNOSIS — Z87442 Personal history of urinary calculi: Secondary | ICD-10-CM

## 2021-01-05 DIAGNOSIS — K432 Incisional hernia without obstruction or gangrene: Secondary | ICD-10-CM | POA: Diagnosis present

## 2021-01-05 DIAGNOSIS — Z933 Colostomy status: Secondary | ICD-10-CM

## 2021-01-05 DIAGNOSIS — Z881 Allergy status to other antibiotic agents status: Secondary | ICD-10-CM

## 2021-01-05 DIAGNOSIS — I4510 Unspecified right bundle-branch block: Secondary | ICD-10-CM | POA: Diagnosis present

## 2021-01-05 DIAGNOSIS — I10 Essential (primary) hypertension: Secondary | ICD-10-CM | POA: Diagnosis present

## 2021-01-05 DIAGNOSIS — Z79899 Other long term (current) drug therapy: Secondary | ICD-10-CM

## 2021-01-05 DIAGNOSIS — Z433 Encounter for attention to colostomy: Principal | ICD-10-CM

## 2021-01-05 DIAGNOSIS — Z91041 Radiographic dye allergy status: Secondary | ICD-10-CM | POA: Diagnosis not present

## 2021-01-05 DIAGNOSIS — Z87891 Personal history of nicotine dependence: Secondary | ICD-10-CM

## 2021-01-05 DIAGNOSIS — Z8051 Family history of malignant neoplasm of kidney: Secondary | ICD-10-CM | POA: Diagnosis not present

## 2021-01-05 DIAGNOSIS — K219 Gastro-esophageal reflux disease without esophagitis: Secondary | ICD-10-CM | POA: Diagnosis present

## 2021-01-05 DIAGNOSIS — Z91013 Allergy to seafood: Secondary | ICD-10-CM

## 2021-01-05 DIAGNOSIS — Z85828 Personal history of other malignant neoplasm of skin: Secondary | ICD-10-CM | POA: Diagnosis not present

## 2021-01-05 DIAGNOSIS — K660 Peritoneal adhesions (postprocedural) (postinfection): Secondary | ICD-10-CM | POA: Diagnosis present

## 2021-01-05 DIAGNOSIS — Z803 Family history of malignant neoplasm of breast: Secondary | ICD-10-CM

## 2021-01-05 DIAGNOSIS — R7303 Prediabetes: Secondary | ICD-10-CM | POA: Diagnosis present

## 2021-01-05 HISTORY — PX: XI ROBOTIC ASSISTED COLOSTOMY TAKEDOWN: SHX6828

## 2021-01-05 SURGERY — CLOSURE, COLOSTOMY, ROBOT-ASSISTED
Anesthesia: General | Site: Abdomen

## 2021-01-05 MED ORDER — ALUM & MAG HYDROXIDE-SIMETH 200-200-20 MG/5ML PO SUSP: 30.0000 mL | Freq: Four times a day (QID) | ORAL | Status: DC | PRN

## 2021-01-05 MED ORDER — ALVIMOPAN 12 MG PO CAPS
12.0000 mg | ORAL_CAPSULE | ORAL | Status: AC
  Administered 2021-01-05: 12 mg via ORAL
  Filled 2021-01-05: qty 1

## 2021-01-05 MED ORDER — 0.9 % SODIUM CHLORIDE (POUR BTL) OPTIME
TOPICAL | Status: DC | PRN
  Administered 2021-01-05: 2000 mL

## 2021-01-05 MED ORDER — PROPOFOL 10 MG/ML IV BOLUS
INTRAVENOUS | Status: DC | PRN
  Administered 2021-01-05: 120 mg via INTRAVENOUS

## 2021-01-05 MED ORDER — ROCURONIUM BROMIDE 10 MG/ML (PF) SYRINGE
PREFILLED_SYRINGE | INTRAVENOUS | Status: AC
  Filled 2021-01-05: qty 10

## 2021-01-05 MED ORDER — ACETAMINOPHEN 160 MG/5ML PO SOLN: 325.0000 mg | ORAL | Status: DC | PRN

## 2021-01-05 MED ORDER — HEPARIN SODIUM (PORCINE) 5000 UNIT/ML IJ SOLN
5000.0000 [IU] | Freq: Once | INTRAMUSCULAR | Status: AC
  Administered 2021-01-05: 5000 [IU] via SUBCUTANEOUS
  Filled 2021-01-05: qty 1

## 2021-01-05 MED ORDER — OXYCODONE HCL 5 MG PO TABS
5.0000 mg | ORAL_TABLET | Freq: Once | ORAL | Status: AC | PRN
  Administered 2021-01-05: 5 mg via ORAL

## 2021-01-05 MED ORDER — FENTANYL CITRATE PF 50 MCG/ML IJ SOSY
PREFILLED_SYRINGE | INTRAMUSCULAR | Status: AC
  Filled 2021-01-05: qty 2

## 2021-01-05 MED ORDER — ENSURE PRE-SURGERY PO LIQD: 296.0000 mL | Freq: Once | ORAL | Status: DC

## 2021-01-05 MED ORDER — LACTATED RINGERS IV SOLN: INTRAVENOUS | Status: DC

## 2021-01-05 MED ORDER — BUPIVACAINE-EPINEPHRINE (PF) 0.25% -1:200000 IJ SOLN
INTRAMUSCULAR | Status: AC
  Filled 2021-01-05: qty 30

## 2021-01-05 MED ORDER — PHENYLEPHRINE HCL-NACL 20-0.9 MG/250ML-% IV SOLN
INTRAVENOUS | Status: DC | PRN
  Administered 2021-01-05: 40 ug/min via INTRAVENOUS

## 2021-01-05 MED ORDER — ENSURE SURGERY PO LIQD
237.0000 mL | Freq: Two times a day (BID) | ORAL | Status: DC
  Administered 2021-01-05 – 2021-01-09 (×8): 237 mL via ORAL

## 2021-01-05 MED ORDER — ONDANSETRON HCL 4 MG/2ML IJ SOLN
INTRAMUSCULAR | Status: DC | PRN
  Administered 2021-01-05: 4 mg via INTRAVENOUS

## 2021-01-05 MED ORDER — LATANOPROST 0.005 % OP SOLN
1.0000 [drp] | Freq: Every day | OPHTHALMIC | Status: DC
  Administered 2021-01-06 – 2021-01-09 (×4): 1 [drp] via OPHTHALMIC
  Filled 2021-01-05: qty 2.5

## 2021-01-05 MED ORDER — SUGAMMADEX SODIUM 200 MG/2ML IV SOLN
INTRAVENOUS | Status: DC | PRN
  Administered 2021-01-05: 200 mg via INTRAVENOUS

## 2021-01-05 MED ORDER — ONDANSETRON HCL 4 MG/2ML IJ SOLN: 4.0000 mg | Freq: Once | INTRAMUSCULAR | Status: DC | PRN

## 2021-01-05 MED ORDER — PHENYLEPHRINE 40 MCG/ML (10ML) SYRINGE FOR IV PUSH (FOR BLOOD PRESSURE SUPPORT)
PREFILLED_SYRINGE | INTRAVENOUS | Status: AC
  Filled 2021-01-05: qty 10

## 2021-01-05 MED ORDER — FENTANYL CITRATE (PF) 250 MCG/5ML IJ SOLN
INTRAMUSCULAR | Status: AC
  Filled 2021-01-05: qty 5

## 2021-01-05 MED ORDER — ALVIMOPAN 12 MG PO CAPS
12.0000 mg | ORAL_CAPSULE | Freq: Two times a day (BID) | ORAL | Status: DC
  Administered 2021-01-06: 12 mg via ORAL
  Filled 2021-01-05: qty 1

## 2021-01-05 MED ORDER — SODIUM CHLORIDE 0.9 % IV SOLN
2.0000 g | Freq: Two times a day (BID) | INTRAVENOUS | Status: AC
  Administered 2021-01-05: 2 g via INTRAVENOUS
  Filled 2021-01-05: qty 2

## 2021-01-05 MED ORDER — BUPIVACAINE LIPOSOME 1.3 % IJ SUSP
INTRAMUSCULAR | Status: AC
  Filled 2021-01-05: qty 20

## 2021-01-05 MED ORDER — FENTANYL CITRATE (PF) 100 MCG/2ML IJ SOLN
INTRAMUSCULAR | Status: AC
  Filled 2021-01-05: qty 2

## 2021-01-05 MED ORDER — SACCHAROMYCES BOULARDII 250 MG PO CAPS
250.0000 mg | ORAL_CAPSULE | Freq: Two times a day (BID) | ORAL | Status: DC
  Administered 2021-01-05 – 2021-01-09 (×8): 250 mg via ORAL
  Filled 2021-01-05 (×8): qty 1

## 2021-01-05 MED ORDER — SIMETHICONE 80 MG PO CHEW: 40.0000 mg | CHEWABLE_TABLET | Freq: Four times a day (QID) | ORAL | Status: DC | PRN

## 2021-01-05 MED ORDER — PHENYLEPHRINE 40 MCG/ML (10ML) SYRINGE FOR IV PUSH (FOR BLOOD PRESSURE SUPPORT)
PREFILLED_SYRINGE | INTRAVENOUS | Status: DC | PRN
  Administered 2021-01-05: 120 ug via INTRAVENOUS
  Administered 2021-01-05: 80 ug via INTRAVENOUS

## 2021-01-05 MED ORDER — OXYCODONE HCL 5 MG/5ML PO SOLN: 5.0000 mg | Freq: Once | ORAL | Status: AC | PRN

## 2021-01-05 MED ORDER — SODIUM CHLORIDE 0.9 % IV SOLN
2.0000 g | INTRAVENOUS | Status: AC
  Administered 2021-01-05: 2 g via INTRAVENOUS
  Filled 2021-01-05: qty 2

## 2021-01-05 MED ORDER — DEXAMETHASONE SODIUM PHOSPHATE 10 MG/ML IJ SOLN
INTRAMUSCULAR | Status: AC
  Filled 2021-01-05: qty 1

## 2021-01-05 MED ORDER — DIPHENHYDRAMINE HCL 50 MG/ML IJ SOLN: 12.5000 mg | Freq: Four times a day (QID) | INTRAMUSCULAR | Status: DC | PRN

## 2021-01-05 MED ORDER — KCL IN DEXTROSE-NACL 20-5-0.45 MEQ/L-%-% IV SOLN
INTRAVENOUS | Status: DC
  Filled 2021-01-05: qty 1000

## 2021-01-05 MED ORDER — BUPIVACAINE-EPINEPHRINE 0.25% -1:200000 IJ SOLN
INTRAMUSCULAR | Status: DC | PRN
  Administered 2021-01-05: 30 mL

## 2021-01-05 MED ORDER — TRAMADOL HCL 50 MG PO TABS
50.0000 mg | ORAL_TABLET | Freq: Four times a day (QID) | ORAL | Status: DC | PRN
  Administered 2021-01-05 – 2021-01-09 (×12): 50 mg via ORAL
  Filled 2021-01-05 (×12): qty 1

## 2021-01-05 MED ORDER — LIDOCAINE 2% (20 MG/ML) 5 ML SYRINGE
INTRAMUSCULAR | Status: DC | PRN
  Administered 2021-01-05: 50 mg via INTRAVENOUS

## 2021-01-05 MED ORDER — ONDANSETRON HCL 4 MG PO TABS: 4.0000 mg | ORAL_TABLET | Freq: Four times a day (QID) | ORAL | Status: DC | PRN

## 2021-01-05 MED ORDER — GABAPENTIN 300 MG PO CAPS
300.0000 mg | ORAL_CAPSULE | ORAL | Status: AC
  Administered 2021-01-05: 300 mg via ORAL
  Filled 2021-01-05: qty 1

## 2021-01-05 MED ORDER — DIPHENHYDRAMINE HCL 12.5 MG/5ML PO ELIX: 12.5000 mg | ORAL_SOLUTION | Freq: Four times a day (QID) | ORAL | Status: DC | PRN

## 2021-01-05 MED ORDER — CHLORHEXIDINE GLUCONATE 0.12 % MT SOLN
15.0000 mL | Freq: Once | OROMUCOSAL | Status: AC
  Administered 2021-01-05: 15 mL via OROMUCOSAL

## 2021-01-05 MED ORDER — LIDOCAINE HCL (PF) 2 % IJ SOLN
INTRAMUSCULAR | Status: AC
  Filled 2021-01-05: qty 5

## 2021-01-05 MED ORDER — FENTANYL CITRATE (PF) 100 MCG/2ML IJ SOLN
INTRAMUSCULAR | Status: DC | PRN
  Administered 2021-01-05 (×7): 50 ug via INTRAVENOUS

## 2021-01-05 MED ORDER — MEPERIDINE HCL 50 MG/ML IJ SOLN
6.2500 mg | INTRAMUSCULAR | Status: DC | PRN
Start: 2021-01-05 — End: 2021-01-05

## 2021-01-05 MED ORDER — ROCURONIUM BROMIDE 10 MG/ML (PF) SYRINGE
PREFILLED_SYRINGE | INTRAVENOUS | Status: DC | PRN
  Administered 2021-01-05: 100 mg via INTRAVENOUS

## 2021-01-05 MED ORDER — FENTANYL CITRATE PF 50 MCG/ML IJ SOSY: 25.0000 ug | PREFILLED_SYRINGE | INTRAMUSCULAR | Status: DC | PRN

## 2021-01-05 MED ORDER — GABAPENTIN 300 MG PO CAPS
300.0000 mg | ORAL_CAPSULE | Freq: Two times a day (BID) | ORAL | Status: DC
  Administered 2021-01-05 – 2021-01-09 (×8): 300 mg via ORAL
  Filled 2021-01-05 (×8): qty 1

## 2021-01-05 MED ORDER — PHENYLEPHRINE HCL (PRESSORS) 10 MG/ML IV SOLN
INTRAVENOUS | Status: AC
  Filled 2021-01-05: qty 2

## 2021-01-05 MED ORDER — ACETAMINOPHEN 500 MG PO TABS
1000.0000 mg | ORAL_TABLET | Freq: Four times a day (QID) | ORAL | Status: DC
  Administered 2021-01-05 – 2021-01-09 (×15): 1000 mg via ORAL
  Filled 2021-01-05 (×14): qty 2

## 2021-01-05 MED ORDER — BUPIVACAINE LIPOSOME 1.3 % IJ SUSP: 20.0000 mL | Freq: Once | INTRAMUSCULAR | Status: DC

## 2021-01-05 MED ORDER — DEXAMETHASONE SODIUM PHOSPHATE 4 MG/ML IJ SOLN
INTRAMUSCULAR | Status: DC | PRN
  Administered 2021-01-05: 5 mg via INTRAVENOUS

## 2021-01-05 MED ORDER — PROPOFOL 10 MG/ML IV BOLUS
INTRAVENOUS | Status: AC
  Filled 2021-01-05: qty 20

## 2021-01-05 MED ORDER — ALBUTEROL SULFATE (2.5 MG/3ML) 0.083% IN NEBU: 3.0000 mL | INHALATION_SOLUTION | Freq: Four times a day (QID) | RESPIRATORY_TRACT | Status: DC | PRN

## 2021-01-05 MED ORDER — ONDANSETRON HCL 4 MG/2ML IJ SOLN
INTRAMUSCULAR | Status: AC
  Filled 2021-01-05: qty 2

## 2021-01-05 MED ORDER — ACETAMINOPHEN 325 MG PO TABS: 325.0000 mg | ORAL_TABLET | ORAL | Status: DC | PRN

## 2021-01-05 MED ORDER — BUPIVACAINE LIPOSOME 1.3 % IJ SUSP
INTRAMUSCULAR | Status: DC | PRN
  Administered 2021-01-05: 20 mL

## 2021-01-05 MED ORDER — HYDROMORPHONE HCL 1 MG/ML IJ SOLN: 0.5000 mg | INTRAMUSCULAR | Status: DC | PRN

## 2021-01-05 MED ORDER — LACTATED RINGERS IR SOLN
Status: DC | PRN
  Administered 2021-01-05: 1000 mL

## 2021-01-05 MED ORDER — ENOXAPARIN SODIUM 40 MG/0.4ML IJ SOSY
40.0000 mg | PREFILLED_SYRINGE | INTRAMUSCULAR | Status: DC
  Administered 2021-01-06 – 2021-01-09 (×4): 40 mg via SUBCUTANEOUS
  Filled 2021-01-05 (×4): qty 0.4

## 2021-01-05 MED ORDER — ENSURE PRE-SURGERY PO LIQD: 592.0000 mL | Freq: Once | ORAL | Status: DC

## 2021-01-05 MED ORDER — ONDANSETRON HCL 4 MG/2ML IJ SOLN
4.0000 mg | Freq: Four times a day (QID) | INTRAMUSCULAR | Status: DC | PRN
  Administered 2021-01-05 (×2): 4 mg via INTRAVENOUS
  Filled 2021-01-05 (×2): qty 2

## 2021-01-05 MED ORDER — OXYCODONE HCL 5 MG PO TABS
ORAL_TABLET | ORAL | Status: AC
  Filled 2021-01-05: qty 1

## 2021-01-05 MED ORDER — ACETAMINOPHEN 500 MG PO TABS
1000.0000 mg | ORAL_TABLET | ORAL | Status: AC
  Administered 2021-01-05: 1000 mg via ORAL
  Filled 2021-01-05: qty 2

## 2021-01-05 MED ORDER — ORAL CARE MOUTH RINSE: 15.0000 mL | Freq: Once | OROMUCOSAL | Status: AC

## 2021-01-05 SURGICAL SUPPLY — 96 items
BAG COUNTER SPONGE SURGICOUNT (BAG) ×2 IMPLANT
BAG SURGICOUNT SPONGE COUNTING (BAG) ×1
BLADE EXTENDED COATED 6.5IN (ELECTRODE) IMPLANT
CANNULA REDUC XI 12-8 STAPL (CANNULA)
CANNULA REDUC XI 12-8MM STAPL (CANNULA)
CANNULA REDUCER 12-8 DVNC XI (CANNULA) IMPLANT
CELLS DAT CNTRL 66122 CELL SVR (MISCELLANEOUS) IMPLANT
COVER SURGICAL LIGHT HANDLE (MISCELLANEOUS) ×6 IMPLANT
COVER TIP SHEARS 8 DVNC (MISCELLANEOUS) ×1 IMPLANT
COVER TIP SHEARS 8MM DA VINCI (MISCELLANEOUS) ×3
DECANTER SPIKE VIAL GLASS SM (MISCELLANEOUS) IMPLANT
DRAIN CHANNEL 19F RND (DRAIN) IMPLANT
DRAPE ARM DVNC X/XI (DISPOSABLE) ×4 IMPLANT
DRAPE COLUMN DVNC XI (DISPOSABLE) ×1 IMPLANT
DRAPE DA VINCI XI ARM (DISPOSABLE) ×12
DRAPE DA VINCI XI COLUMN (DISPOSABLE) ×3
DRAPE SURG IRRIG POUCH 19X23 (DRAPES) ×3 IMPLANT
DRSG OPSITE POSTOP 4X10 (GAUZE/BANDAGES/DRESSINGS) IMPLANT
DRSG OPSITE POSTOP 4X6 (GAUZE/BANDAGES/DRESSINGS) IMPLANT
DRSG OPSITE POSTOP 4X8 (GAUZE/BANDAGES/DRESSINGS) IMPLANT
DRSG TELFA 3X8 NADH (GAUZE/BANDAGES/DRESSINGS) ×3 IMPLANT
ELECT PENCIL ROCKER SW 15FT (MISCELLANEOUS) ×3 IMPLANT
ELECT REM PT RETURN 15FT ADLT (MISCELLANEOUS) ×3 IMPLANT
ENDOLOOP SUT PDS II  0 18 (SUTURE)
ENDOLOOP SUT PDS II 0 18 (SUTURE) IMPLANT
EVACUATOR SILICONE 100CC (DRAIN) IMPLANT
GAUZE SPONGE 4X4 12PLY STRL (GAUZE/BANDAGES/DRESSINGS) ×3 IMPLANT
GLOVE SURG ENC MOIS LTX SZ6.5 (GLOVE) IMPLANT
GLOVE SURG UNDER POLY LF SZ7 (GLOVE) ×9 IMPLANT
GOWN STRL REUS W/TWL XL LVL3 (GOWN DISPOSABLE) ×9 IMPLANT
GRASPER SUT TROCAR 14GX15 (MISCELLANEOUS) IMPLANT
HOLDER FOLEY CATH W/STRAP (MISCELLANEOUS) ×3 IMPLANT
IRRIG SUCT STRYKERFLOW 2 WTIP (MISCELLANEOUS) ×3
IRRIGATION SUCT STRKRFLW 2 WTP (MISCELLANEOUS) ×1 IMPLANT
KIT PROCEDURE DA VINCI SI (MISCELLANEOUS)
KIT PROCEDURE DVNC SI (MISCELLANEOUS) IMPLANT
KIT TURNOVER KIT A (KITS) ×3 IMPLANT
NEEDLE INSUFFLATION 14GA 120MM (NEEDLE) ×3 IMPLANT
PACK CARDIOVASCULAR III (CUSTOM PROCEDURE TRAY) ×3 IMPLANT
PACK COLON (CUSTOM PROCEDURE TRAY) ×3 IMPLANT
PAD POSITIONING PINK XL (MISCELLANEOUS) ×3 IMPLANT
RELOAD STAPLER 3.5X60 BLU DVNC (STAPLE) IMPLANT
RELOAD STAPLER 4.3X60 GRN DVNC (STAPLE) IMPLANT
RELOAD STAPLER GREEN 60MM (STAPLE) ×2 IMPLANT
RTRCTR WOUND ALEXIS 18CM MED (MISCELLANEOUS)
SCISSORS LAP 5X35 DISP (ENDOMECHANICALS) ×3 IMPLANT
SEAL CANN UNIV 5-8 DVNC XI (MISCELLANEOUS) ×3 IMPLANT
SEAL XI 5MM-8MM UNIVERSAL (MISCELLANEOUS) ×9
SEALER VESSEL DA VINCI XI (MISCELLANEOUS) ×3
SEALER VESSEL EXT DVNC XI (MISCELLANEOUS) ×1 IMPLANT
SOLUTION ELECTROLUBE (MISCELLANEOUS) ×3 IMPLANT
STAPLE ECHEON FLEX 60 POW ENDO (STAPLE) ×3 IMPLANT
STAPLER 60 DA VINCI SURE FORM (STAPLE)
STAPLER 60 SUREFORM DVNC (STAPLE) IMPLANT
STAPLER CANNULA SEAL DVNC XI (STAPLE) IMPLANT
STAPLER CANNULA SEAL XI (STAPLE)
STAPLER ECHELON POWER CIR 29 (STAPLE) ×3 IMPLANT
STAPLER ECHELON POWER CIR 31 (STAPLE) IMPLANT
STAPLER RELOAD 3.5X60 BLU DVNC (STAPLE)
STAPLER RELOAD 3.5X60 BLUE (STAPLE)
STAPLER RELOAD 4.3X60 GREEN (STAPLE)
STAPLER RELOAD 4.3X60 GRN DVNC (STAPLE)
STAPLER RELOAD GREEN 60MM (STAPLE) ×6
STOPCOCK 4 WAY LG BORE MALE ST (IV SETS) IMPLANT
SUT ETHILON 2 0 PS N (SUTURE) IMPLANT
SUT NOVA NAB DX-16 0-1 5-0 T12 (SUTURE) ×6 IMPLANT
SUT PROLENE 2 0 KS (SUTURE) ×3 IMPLANT
SUT SILK 2 0 (SUTURE) ×3
SUT SILK 2 0 SH CR/8 (SUTURE) IMPLANT
SUT SILK 2-0 18XBRD TIE 12 (SUTURE) ×1 IMPLANT
SUT SILK 3 0 (SUTURE)
SUT SILK 3 0 SH CR/8 (SUTURE) ×3 IMPLANT
SUT SILK 3-0 18XBRD TIE 12 (SUTURE) IMPLANT
SUT V-LOC BARB 180 2/0GR6 GS22 (SUTURE)
SUT VIC AB 2-0 SH 18 (SUTURE) IMPLANT
SUT VIC AB 2-0 SH 27 (SUTURE)
SUT VIC AB 2-0 SH 27X BRD (SUTURE) IMPLANT
SUT VIC AB 3-0 SH 18 (SUTURE) IMPLANT
SUT VIC AB 4-0 PS2 27 (SUTURE) ×6 IMPLANT
SUT VICRYL 0 UR6 27IN ABS (SUTURE) ×3 IMPLANT
SUTURE V-LC BRB 180 2/0GR6GS22 (SUTURE) IMPLANT
SYR 10ML ECCENTRIC (SYRINGE) ×3 IMPLANT
SYS LAPSCP GELPORT 120MM (MISCELLANEOUS)
SYS WOUND ALEXIS 18CM MED (MISCELLANEOUS) ×3
SYSTEM LAPSCP GELPORT 120MM (MISCELLANEOUS) IMPLANT
SYSTEM WOUND ALEXIS 18CM MED (MISCELLANEOUS) ×1 IMPLANT
TAPE PAPER 3X10 WHT MICROPORE (GAUZE/BANDAGES/DRESSINGS) ×3 IMPLANT
TOWEL OR 17X26 10 PK STRL BLUE (TOWEL DISPOSABLE) IMPLANT
TOWEL OR NON WOVEN STRL DISP B (DISPOSABLE) ×3 IMPLANT
TRAY FOL W/BAG SLVR 16FR STRL (SET/KITS/TRAYS/PACK) ×1 IMPLANT
TRAY FOLEY MTR SLVR 16FR STAT (SET/KITS/TRAYS/PACK) IMPLANT
TRAY FOLEY W/BAG SLVR 16FR LF (SET/KITS/TRAYS/PACK) ×3
TROCAR ADV FIXATION 5X100MM (TROCAR) ×3 IMPLANT
TUBING CONNECTING 10 (TUBING) ×4 IMPLANT
TUBING CONNECTING 10' (TUBING) ×2
TUBING INSUFFLATION 10FT LAP (TUBING) ×3 IMPLANT

## 2021-01-05 NOTE — Anesthesia Procedure Notes (Signed)
Procedure Name: Intubation Date/Time: 01/05/2021 8:20 AM Performed by: Claudia Desanctis, CRNA Pre-anesthesia Checklist: Patient identified, Emergency Drugs available, Suction available and Patient being monitored Patient Re-evaluated:Patient Re-evaluated prior to induction Oxygen Delivery Method: Circle system utilized Preoxygenation: Pre-oxygenation with 100% oxygen Induction Type: IV induction Ventilation: Mask ventilation without difficulty Laryngoscope Size: 2 and Miller Grade View: Grade I Tube type: Oral Number of attempts: 1 Airway Equipment and Method: Stylet Placement Confirmation: ETT inserted through vocal cords under direct vision, positive ETCO2 and breath sounds checked- equal and bilateral Secured at: 22 cm Tube secured with: Tape Dental Injury: Teeth and Oropharynx as per pre-operative assessment

## 2021-01-05 NOTE — Progress Notes (Signed)
   01/05/21 1400  Oxygen Therapy  SpO2 97 %  O2 Device Room Air  Patient Activity (if Appropriate) Ambulating  Mobility  Activity Ambulated in hall  Level of Assistance Standby assist, set-up cues, supervision of patient - no hands on  Assistive Device Front wheel walker  Distance Ambulated (ft) 120 ft  Mobility Ambulated with assistance in hallway  Mobility Response Tolerated well  Mobility performed by Mobility specialist  $Mobility charge 1 Mobility   Pt agreeable to mobilizing this afternoon. Ambulated about 175ft in hall with RW, tolerated well. She did note some pain at her surgical site when she returned to the room. Otherwise no complaints. Instructed pt on log roll technique to get into bed. Left pt in bed with call bell at side and family present.   [place] Specialist Acute Rehab Services Office: [phone]

## 2021-01-05 NOTE — Discharge Instructions (Signed)
SURGERY: POST OP INSTRUCTIONS (Surgery for small bowel obstruction, colon resection, etc)   ######################################################################  EAT Gradually transition to a high fiber diet with a fiber supplement over the next few days after discharge  WALK Walk an hour a day.  Control your pain to do that.    CONTROL PAIN Control pain so that you can walk, sleep, tolerate sneezing/coughing, go up/down stairs.  HAVE A BOWEL MOVEMENT DAILY Keep your bowels regular to avoid problems.  OK to try a laxative to override constipation.  OK to use an antidairrheal to slow down diarrhea.  Call if not better after 2 tries  CALL IF YOU HAVE PROBLEMS/CONCERNS Call if you are still struggling despite following these instructions. Call if you have concerns not answered by these instructions  ######################################################################   DIET Follow a light diet the first few days at home.  Start with a bland diet such as soups, liquids, starchy foods, low fat foods, etc.  If you feel full, bloated, or constipated, stay on a ful liquid or pureed/blenderized diet for a few days until you feel better and no longer constipated. Be sure to drink plenty of fluids every day to avoid getting dehydrated (feeling dizzy, not urinating, etc.). Gradually add a fiber supplement to your diet over the next week.  Gradually get back to a regular solid diet.  Avoid fast food or heavy meals the first week as you are more likely to get nauseated. It is expected for your digestive tract to need a few months to get back to normal.  It is common for your bowel movements and stools to be irregular.  You will have occasional bloating and cramping that should eventually fade away.  Until you are eating solid food normally, off all pain medications, and back to regular activities; your bowels will not be normal. Focus on eating a low-fat, high fiber diet the rest of your life  (See Getting to Good Bowel Health, below).  CARE of your INCISION or WOUND  It is good for closed incisions and even open wounds to be washed every day.  Shower every day.  Short baths are fine.  Wash the incisions and wounds clean with soap & water.    You may leave closed incisions open to air if it is dry.   You may cover the incision with clean gauze & replace it after your daily shower for comfort.  STAPLES: You have skin staples.  Leave them in place & set up an appointment for them to be removed by a surgery office nurse ~10 days after surgery. = 1st week of January 2024    ACTIVITIES as tolerated Start light daily activities --- self-care, walking, climbing stairs-- beginning the day after surgery.  Gradually increase activities as tolerated.  Control your pain to be active.  Stop when you are tired.  Ideally, walk several times a day, eventually an hour a day.   Most people are back to most day-to-day activities in a few weeks.  It takes 4-8 weeks to get back to unrestricted, intense activity. If you can walk 30 minutes without difficulty, it is safe to try more intense activity such as jogging, treadmill, bicycling, low-impact aerobics, swimming, etc. Save the most intensive and strenuous activity for last (Usually 4-8 weeks after surgery) such as sit-ups, heavy lifting, contact sports, etc.  Refrain from any intense heavy lifting or straining until you are off narcotics for pain control.  You will have off days, but things should improve   week-by-week. DO NOT PUSH THROUGH PAIN.  Let pain be your guide: If it hurts to do something, don't do it.  Pain is your body warning you to avoid that activity for another week until the pain goes down. You may drive when you are no longer taking narcotic prescription pain medication, you can comfortably wear a seatbelt, and you can safely make sudden turns/stops to protect yourself without hesitating due to pain. You may have sexual intercourse when it  is comfortable. If it hurts to do something, stop.  MEDICATIONS Take your usually prescribed home medications unless otherwise directed.   Blood thinners:  Usually you can restart any strong blood thinners after the second postoperative day.  It is OK to take aspirin right away.     If you are on strong blood thinners (warfarin/Coumadin, Plavix, Xerelto, Eliquis, Pradaxa, etc), discuss with your surgeon, medicine PCP, and/or cardiologist for instructions on when to restart the blood thinner & if blood monitoring is needed (PT/INR blood check, etc).     PAIN CONTROL Pain after surgery or related to activity is often due to strain/injury to muscle, tendon, nerves and/or incisions.  This pain is usually short-term and will improve in a few months.  To help speed the process of healing and to get back to regular activity more quickly, DO THE FOLLOWING THINGS TOGETHER: Increase activity gradually.  DO NOT PUSH THROUGH PAIN Use Ice and/or Heat Try Gentle Massage and/or Stretching Take over the counter pain medication Take Narcotic prescription pain medication for more severe pain  Good pain control = faster recovery.  It is better to take more medicine to be more active than to stay in bed all day to avoid medications.  Increase activity gradually Avoid heavy lifting at first, then increase to lifting as tolerated over the next 6 weeks. Do not "push through" the pain.  Listen to your body and avoid positions and maneuvers than reproduce the pain.  Wait a few days before trying something more intense Walking an hour a day is encouraged to help your body recover faster and more safely.  Start slowly and stop when getting sore.  If you can walk 30 minutes without stopping or pain, you can try more intense activity (running, jogging, aerobics, cycling, swimming, treadmill, sex, sports, weightlifting, etc.) Remember: If it hurts to do it, then don't do it! Use Ice and/or Heat You will have swelling and  bruising around the incisions.  This will take several weeks to resolve. Ice packs or heating pads (6-8 times a day, 30-60 minutes at a time) will help sooth soreness & bruising. Some people prefer to use ice alone, heat alone, or alternate between ice & heat.  Experiment and see what works best for you.  Consider trying ice for the first few days to help decrease swelling and bruising; then, switch to heat to help relax sore spots and speed recovery. Shower every day.  Short baths are fine.  It feels good!  Keep the incisions and wounds clean with soap & water.   Try Gentle Massage and/or Stretching Massage at the area of pain many times a day Stop if you feel pain - do not overdo it Take over the counter pain medication This helps the muscle and nerve tissues become less irritable and calm down faster Choose ONE of the following over-the-counter anti-inflammatory medications: Acetaminophen 500mg tabs (Tylenol) 1-2 pills with every meal and just before bedtime (avoid if you have liver problems or if you have   acetaminophen in you narcotic prescription) Naproxen 220mg tabs (ex. Aleve, Naprosyn) 1-2 pills twice a day (avoid if you have kidney, stomach, IBD, or bleeding problems) Ibuprofen 200mg tabs (ex. Advil, Motrin) 3-4 pills with every meal and just before bedtime (avoid if you have kidney, stomach, IBD, or bleeding problems) Take with food/snack several times a day as directed for at least 2 weeks to help keep pain / soreness down & more manageable. Take Narcotic prescription pain medication for more severe pain A prescription for strong pain control is often given to you upon discharge (for example: oxycodone/Percocet, hydrocodone/Norco/Vicodin, or tramadol/Ultram) Take your pain medication as prescribed. Be mindful that most narcotic prescriptions contain Tylenol (acetaminophen) as well - avoid taking too much Tylenol. If you are having problems/concerns with the prescription medicine (does  not control pain, nausea, vomiting, rash, itching, etc.), please call us [phone] to see if we need to switch you to a different pain medicine that will work better for you and/or control your side effects better. If you need a refill on your pain medication, you must call the office before 4 pm and on weekdays only.  By federal law, prescriptions for narcotics cannot be called into a pharmacy.  They must be filled out on paper & picked up from our office by the patient or authorized caretaker.  Prescriptions cannot be filled after 4 pm nor on weekends.    WHEN TO CALL US [phone] Severe uncontrolled or worsening pain  Fever over 101 F (38.5 C) Concerns with the incision: Worsening pain, redness, rash/hives, swelling, bleeding, or drainage Reactions / problems with new medications (itching, rash, hives, nausea, etc.) Nausea and/or vomiting Difficulty urinating Difficulty breathing Worsening fatigue, dizziness, lightheadedness, blurred vision Other concerns If you are not getting better after two weeks or are noticing you are getting worse, contact our office [phone] for further advice.  We may need to adjust your medications, re-evaluate you in the office, send you to the emergency room, or see what other things we can do to help. The clinic staff is available to answer your questions during regular business hours (8:30am-5pm).  Please don't hesitate to call and ask to speak to one of our nurses for clinical concerns.    A surgeon from [facility] is always on call at the hospitals 24 hours/day If you have a medical emergency, go to the nearest emergency room or call 911.  FOLLOW UP in our office One the day of your discharge from the hospital (or the next business weekday), please call [facility] to set up or confirm an appointment to see your surgeon in the office for a follow-up appointment.  Usually it is 2-3 weeks after your surgery.   If you  have skin staples at your incision(s), let the office know so we can set up a time in the office for the nurse to remove them (usually around 10 days after surgery). Make sure that you call for appointments the day of discharge (or the next business weekday) from the hospital to ensure a convenient appointment time. IF YOU HAVE DISABILITY OR FAMILY LEAVE FORMS, BRING THEM TO THE OFFICE FOR PROCESSING.  DO NOT GIVE THEM TO YOUR DOCTOR.  [facility] [address], [address], [place], [place]  [address] ? [phone] - Main [phone] - Toll Free,  [phone] - Fax [email or web address]    GETTING TO GOOD BOWEL HEALTH. It is expected for your digestive tract to   need a few months to get back to normal.  It is common for your bowel movements and stools to be irregular.  You will have occasional bloating and cramping that should eventually fade away.  Until you are eating solid food normally, off all pain medications, and back to regular activities; your bowels will not be normal.   Avoiding constipation The goal: ONE SOFT BOWEL MOVEMENT A DAY!    Drink plenty of fluids.  Choose water first. TAKE A FIBER SUPPLEMENT EVERY DAY THE REST OF YOUR LIFE During your first week back home, gradually add back a fiber supplement every day Experiment which form you can tolerate.   There are many forms such as powders, tablets, wafers, gummies, etc Psyllium bran (Metamucil), methylcellulose (Citrucel), Miralax or Glycolax, Benefiber, Flax Seed.  Adjust the dose week-by-week (1/2 dose/day to 6 doses a day) until you are moving your bowels 1-2 times a day.  Cut back the dose or try a different fiber product if it is giving you problems such as diarrhea or bloating. Sometimes a laxative is needed to help jump-start bowels if constipated until the fiber supplement can help regulate your bowels.  If you are tolerating eating & you are farting, it is okay to try a gentle  laxative such as double dose MiraLax, prune juice, or Milk of Magnesia.  Avoid using laxatives too often. Stool softeners can sometimes help counteract the constipating effects of narcotic pain medicines.  It can also cause diarrhea, so avoid using for too long. If you are still constipated despite taking fiber daily, eating solids, and a few doses of laxatives, call our office. Controlling diarrhea Try drinking liquids and eating bland foods for a few days to avoid stressing your intestines further. Avoid dairy products (especially milk & ice cream) for a short time.  The intestines often can lose the ability to digest lactose when stressed. Avoid foods that cause gassiness or bloating.  Typical foods include beans and other legumes, cabbage, broccoli, and dairy foods.  Avoid greasy, spicy, fast foods.  Every person has some sensitivity to other foods, so listen to your body and avoid those foods that trigger problems for you. Probiotics (such as active yogurt, Align, etc) may help repopulate the intestines and colon with normal bacteria and calm down a sensitive digestive tract Adding a fiber supplement gradually can help thicken stools by absorbing excess fluid and retrain the intestines to act more normally.  Slowly increase the dose over a few weeks.  Too much fiber too soon can backfire and cause cramping & bloating. It is okay to try and slow down diarrhea with a few doses of antidiarrheal medicines.   Bismuth subsalicylate (ex. Kayopectate, Pepto Bismol) for a few doses can help control diarrhea.  Avoid if pregnant.   Loperamide (Imodium) can slow down diarrhea.  Start with one tablet (2mg) first.  Avoid if you are having fevers or severe pain.  ILEOSTOMY PATIENTS WILL HAVE CHRONIC DIARRHEA since their colon is not in use.    Drink plenty of liquids.  You will need to drink even more glasses of water/liquid a day to avoid getting dehydrated. Record output from your ileostomy.  Expect to empty  the bag every 3-4 hours at first.  Most people with a permanent ileostomy empty their bag 4-6 times at the least.   Use antidiarrheal medicine (especially Imodium) several times a day to avoid getting dehydrated.  Start with a dose at bedtime & breakfast.  Adjust up or   down as needed.  Increase antidiarrheal medications as directed to avoid emptying the bag more than 8 times a day (every 3 hours). Work with your wound ostomy nurse to learn care for your ostomy.  See ostomy care instructions. TROUBLESHOOTING IRREGULAR BOWELS 1) Start with a soft & bland diet. No spicy, greasy, or fried foods.  2) Avoid gluten/wheat or dairy products from diet to see if symptoms improve. 3) Miralax 17gm or flax seed mixed in 8oz. water or juice-daily. May use 2-4 times a day as needed. 4) Gas-X, Phazyme, etc. as needed for gas & bloating.  5) Prilosec (omeprazole) over-the-counter as needed 6)  Consider probiotics (Align, Activa, etc) to help calm the bowels down  Call your doctor if you are getting worse or not getting better.  Sometimes further testing (cultures, endoscopy, X-ray studies, CT scans, bloodwork, etc.) may be needed to help diagnose and treat the cause of the diarrhea. [facility] [address], [address], [place], [place]  [address] [phone] - Main.    [phone]  - Toll Free.   [phone] - Fax [email or web address]   ###############################   #######################################################  Ostomy Support Information  You've heard that people get along just fine with only one of their eyes, or one of their lungs, or one of their kidneys. But you also know that you have only one intestine and only one bladder, and that leaves you feeling awfully empty, both physically and emotionally: You think no other people go around without part of their intestine with the ends of their intestines sticking out through their abdominal walls.    YOU ARE NOT ALONE.  There are nearly three quarters of a million people in the [place] who have an ostomy; people who have had surgery to remove all or part of their colons or bladders.   There is even a national association, the [employer] with over 350 local affiliated support groups that are organized by volunteers who provide peer support and counseling. [employer] has a toll free telephone num-ber, 800-826-0826 and an educational, interactive website, www.ostomy.org   An ostomy is an opening in the belly (abdominal wall) made by surgery. Ostomates are people who have had this procedure. The opening (stoma) allows the kidney or bowel to grdischarge waste. An external pouch covers the stoma to collect waste. Pouches are are a simple bag and are odor free. Different companies have disposable or reusable pouches to fit one's lifestyle. An ostomy can either be temporary or permanent.   THERE ARE THREE MAIN TYPES OF OSTOMIES Colostomy. A colostomy is a surgically created opening in the large intestine (colon). Ileostomy. An ileostomy is a surgically created opening in the small intestine. Urostomy. A urostomy is a surgically created opening to divert urine away from the bladder.  OSTOMY Care  The following guidelines will make care of your colostomy easier. Keep this information close by for quick reference.  Helpful DIET hints Eat a well-balanced diet including vegetables and fresh fruits. Eat on a regular schedule.  Drink at least 6 to 8 glasses of fluids daily. Eat slowly in a relaxed atmosphere. Chew your food thoroughly. Avoid chewing gum, smoking, and drinking from a straw. This will help decrease the amount of air you swallow, which may help reduce gas. Eating yogurt or drinking buttermilk may help reduce gas.  To control gas at night, do not eat after 8 p.m. This will give your bowel time to quiet down before you go   to bed.  If gas is a problem, you can purchase  Beano. Sprinkle Beano on the first bite of food before eating to reduce gas. It has no flavor and should not change the taste of your food. You can buy Beano over the counter at your local drugstore.  Foods like fish, onions, garlic, broccoli, asparagus, and cabbage produce odor. Although your pouch is odor-proof, if you eat these foods you may notice a stronger odor when emptying your pouch. If this is a concern, you may want to limit these foods in your diet.  If you have an ileostomy, you will have chronic diarrhea & need to drink more liquids to avoid getting dehydrated.  Consider antidiarrheal medicine like imodium (loperamide) or Lomotil to help slow down bowel movements / diarrhea into your ileostomy bag.  GETTING TO GOOD BOWEL HEALTH WITH AN ILEOSTOMY    With the colon bypassed & not in use, you will have small bowel diarrhea.   It is important to thicken & slow your bowel movements down.   The goal: 4-6 small BOWEL MOVEMENTS A DAY It is important to drink plenty of liquids to avoid getting dehydrated  CONTROLLING ILEOSTOMY DIARRHEA  TAKE A FIBER SUPPLEMENT (FiberCon or Benefiner soluble fiber) twice a day - to thicken stools by absorbing excess fluid and retrain the intestines to act more normally.  Slowly increase the dose over a few weeks.  Too much fiber too soon can backfire and cause cramping & bloating.  TAKE AN IRON SUPPLEMENT twice a day to naturally constipate your bowels.  Usually ferrous sulfate 325mg twice a day)  TAKE ANTI-DIARRHEAL MEDICINES: Loperamide (Imodium) can slow down diarrhea.  Start with two tablets (= 4mg) first and then try one tablet every 6 hours.  Can go up to 2 pills four times day (8 pills of 2mg max) Avoid if you are having fevers or severe pain.  If you are not better or start feeling worse, stop all medicines and call your doctor for advice LoMotil (Diphenoxylate / Atropine) is another medicine that can constipate & slow down bowel moevements Pepto  Bismol (bismuth) can gently thicken bowels as well  If diarrhea is worse,: drink plenty of liquids and try simpler foods for a few days to avoid stressing your intestines further. Avoid dairy products (especially milk & ice cream) for a short time.  The intestines often can lose the ability to digest lactose when stressed. Avoid foods that cause gassiness or bloating.  Typical foods include beans and other legumes, cabbage, broccoli, and dairy foods.  Every person has some sensitivity to other foods, so listen to our body and avoid those foods that trigger problems for you.Call your doctor if you are getting worse or not better.  Sometimes further testing (cultures, endoscopy, X-ray studies, bloodwork, etc) may be needed to help diagnose and treat the cause of the diarrhea. Take extra anti-diarrheal medicines (maximum is 8 pills of 2mg loperamide a day)   Tips for POUCHING an OSTOMY   Changing Your Pouch The best time to change your pouch is in the morning, before eating or drinking anything. Your stoma can function at any time, but it will function more after eating or drinking.   Applying the pouching system  Place all your equipment close at hand before removing your pouch.  Wash your hands.  Stand or sit in front of a mirror. Use the position that works best for you. Remember that you must keep the skin around the stoma   wrinkle-free for a good seal.  Gently remove the used pouch (1-piece system) or the pouch and old wafer (2-piece system). Empty the pouch into the toilet. Save the closure clip to use again.  Wash the stoma itself and the skin around the stoma. Your stoma may bleed a little when being washed. This is normal. Rinse and pat dry. You may use a wash cloth or soft paper towels (like Bounty), mild soap (like Dial, Safeguard, or Ivory), and water. Avoid soaps that contain perfumes or lotions.  For a new pouch (1-piece system) or a new wafer (2-piece system), measure your  stoma using the stoma guide in each box of supplies.  Trace the shape of your stoma onto the back of the new pouch or the back of the new wafer. Cut out the opening. Remove the paper backing and set it aside.  Optional: Apply a skin barrier powder to surrounding skin if it is irritated (bare or weeping), and dust off the excess. Optional: Apply a skin-prep wipe (such as Skin Prep or All-Kare) to the skin around the stoma, and let it dry. Do not apply this solution if the skin is irritated (red, tender, or broken) or if you have shaved around the stoma. Optional: Apply a skin barrier paste (such as Stomahesive, Coloplast, or Premium) around the opening cut in the back of the pouch or wafer. Allow it to dry for 30 to 60 seconds.  Hold the pouch (1-piece system) or wafer (2-piece system) with the sticky side toward your body. Make sure the skin around the stoma is wrinkle-free. Center the opening on the stoma, then press firmly to your abdomen (Fig. 4). Look in the mirror to check if you are placing the pouch, or wafer, in the right position. For a 2-piece system, snap the pouch onto the wafer. Make sure it snaps into place securely.  Place your hand over the stoma and the pouch or wafer for about 30 seconds. The heat from your hand can help the pouch or wafer stick to your skin.  Add deodorant (such as Super Banish or Nullo) to your pouch. Other options include food extracts such as vanilla oil and peppermint extract. Add about 10 drops of the deodorant to the pouch. Then apply the closure clamp. Note: Do not use toxic  chemicals or commercial cleaning agents in your pouch. These substances may harm the stoma.  Optional: For extra seal, apply tape to all 4 sides around the pouch or wafer, as if you were framing a picture. You may use any brand of medical adhesive tape. Change your pouch every 5 to 7 days. Change it immediately if a leak occurs.  Wash your hands afterwards.  If you are wearing a  2-piece system, you may use 2 new pouches per week and alternate them. Rinse the pouch with mild soap and warm water and hang it to dry for the next day. Apply the fresh pouch. Alternate the 2 pouches like this for a week. After a week, change the wafer and begin with 2 new pouches. Place the old pouches in a plastic bag, and put them in the trash.   LIVING WITH AN OSTOMY  Emptying Your Pouch Empty your pouch when it is one-third full (of urine, stool, and/or gas). If you wait until your pouch is fuller than this, it will be more difficult to empty and more noticeable. When you empty your pouch, either put toilet paper in the toilet bowl first, or flush the   toilet while you empty the pouch. This will reduce splashing. You can empty the pouch between your legs or to one side while sitting, or while standing or stooping. If you have a 2-piece system, you can snap off the pouch to empty it. Remember that your stoma may function during this time. If you wish to rinse your pouch after you empty it, a turkey baster can be helpful. When using a baster, squirt water up into the pouch through the opening at the bottom. With a 2-piece system, you can snap off the pouch to rinse it. After rinsing  your pouch, empty it into the toilet. When rinsing your pouch at home, put a few granules of Dreft soap in the rinse water. This helps lubricate and freshen your pouch. The inside of your pouch can be sprayed with non-stick cooking oil (Pam spray). This may help reduce stool sticking to the inside of the pouch.  Bathing You may shower or bathe with your pouch on or off. Remember that your stoma may function during this time.  The materials you use to wash your stoma and the skin around it should be clean, but they do not need to be sterile.  Wearing Your Pouch During hot weather, or if you perspire a lot in general, wear a cover over your pouch. This may prevent a rash on your skin under the pouch. Pouch covers are  sold at ostomy supply stores. Wear the pouch inside your underwear for better support. Watch your weight. Any gain or loss of 10 to 15 pounds or more can change the way your pouch fits.  Going Away From Home A collapsible cup (like those that come in travel kits) or a soft plastic squirt bottle with a pull-up top (like a travel bottle for shampoo) can be used for rinsing your pouch when you are away from home. Tilt the opening of the pouch at an upward angle when using a cup to rinse.  Carry wet wipes or extra tissues to use in public bathrooms.  Carry an extra pouching system with you at all times.  Never keep ostomy supplies in the glove compartment of your car. Extreme heat or cold can damage the skin barriers and adhesive wafers on the pouch.  When you travel, carry your ostomy supplies with you at all times. Keep them within easy reach. Do not pack ostomy supplies in baggage that will be checked or otherwise separated from you, because your baggage might be lost. If you're traveling out of the country, it is helpful to have a letter stating that you are carrying ostomy supplies as a medical necessity.  If you need ostomy supplies while traveling, look in the yellow pages of the telephone book under "Surgical Supplies." Or call the local ostomy organization to find out where supplies are available.  Do not let your ostomy supplies get low. Always order new pouches before you use the last one.  Reducing Odor Limit foods such as broccoli, cabbage, onions, fish, and garlic in your diet to help reduce odor. Each time you empty your pouch, carefully clean the opening of the pouch, both inside and outside, with toilet paper. Rinse your pouch 1 or 2 times daily after you empty it (see directions for emptying your pouch and going away from home). Add deodorant (such as Super Banish or Nullo) to your pouch. Use air deodorizers in your bathroom. Do not add aspirin to your pouch. Even though  aspirin can help prevent odor, it   could cause ulcers on your stoma.  When to call the doctor Call the doctor if you have any of the following symptoms: Purple, black, or white stoma Severe cramps lasting more than 6 hours Severe watery discharge from the stoma lasting more than 6 hours No output from the colostomy for 3 days Excessive bleeding from your stoma Swelling of your stoma to more than 1/2-inch larger than usual Pulling inward of your stoma below skin level Severe skin irritation or deep ulcers Bulging or other changes in your abdomen  When to call your ostomy nurse Call your ostomy/enterostomal therapy (WOCN) nurse if any of the following occurs: Frequent leaking of your pouching system Change in size or appearance of your stoma, causing discomfort or problems with your pouch Skin rash or rawness Weight gain or loss that causes problems with your pouch     FREQUENTLY ASKED QUESTIONS   Why haven't you met any of these folks who have an ostomy?  Well, maybe you have! You just did not recognize them because an ostomy doesn't show. It can be kept secret if you wish. Why, maybe some of your best friends, office associates or neighbors have an ostomy ... you never can tell. People facing ostomy surgery have many quality-of-life questions like: Will you bulge? Smell? Make noises? Will you feel waste leaving your body? Will you be a captive of the toilet? Will you starve? Be a social outcast? Get/stay married? Have babies? Easily bathe, go swimming, bend over?  OK, let's look at what you can expect:   Will you bulge?  Remember, without part of the intestine or bladder, and its contents, you should have a flatter tummy than before. You can expect to wear, with little exception, what you wore before surgery ... and this in-cludes tight clothing and bathing suits.   Will you smell?  Today, thanks to modern odor proof pouching systems, you can walk into an ostomy support group  meeting and not smell anything that is foul or offensive. And, for those with an ileostomy or colostomy who are concerned about odor when emptying their pouch, there are in-pouch deodorants that can be used to eliminate any waste odors that may exist.   Will you make noises?  Everyone produces gas, especially if they are an air-swallower. But intestinal sounds that occur from time to time are no differ-ent than a gurgling tummy, and quite often your clothing will muffle any sounds.   Will you feel the waste discharges?  For those with a colostomy or ileostomy there might be a slight pressure when waste leaves your body, but understand that the intestines have no nerve endings, so there will be no unpleasant sensations. Those with a urostomy will probably be unaware of any kidney drainage.   Will you be a captive of the toilet?  Immediately post-op you will spend more time in the bathroom than you will after your body recovers from surgery. Every person is different, but on average those with an ileostomy or urostomy may empty their pouches 4 to 6 times a day; a little  less if you have a colostomy. The average wear time between pouch system changes is 3 to 5 days and the changing process should take less than 30 minutes.   Will I need to be on a special diet? Most people return to their normal diet when they have recovered from surgery. Be sure to chew your food well, eat a well-balanced diet and drink plenty of fluids. If   you experience problems with a certain food, wait a couple of weeks and try it again.  Will there be odor and noises? Pouching systems are designed to be odor-proof or odor-resistant. There are deodorants that can be used in the pouch. Medications are also available to help reduce odor. Limit gas-producing foods and carbonated beverages. You will experience less gas and fewer noises as you heal from surgery.  How much time will it take to care for my ostomy? At first, you may  spend a lot of time learning about your ostomy and how to take care of it. As you become more comfortable and skilled at changing the pouching system, it will take very little time to care for it.   Will I be able to return to work? People with ostomies can perform most jobs. As soon as you have healed from surgery, you should be able to return to work. Heavy lifting (more than 10 pounds) may be discouraged.   What about intimacy? Sexual relationships and intimacy are important and fulfilling aspects of your life. They should continue after ostomy surgery. Intimacy-related concerns should be discussed openly between you and your partner.   Can I wear regular clothing? You do not need to wear special clothing. Ostomy pouches are fairly flat and barely noticeable. Elastic undergarments will not hurt the stoma or prevent the ostomy from functioning.   Can I participate in sports? An ostomy should not limit your involvement in sports. Many people with ostomies are runners, skiers, swimmers or participate in other active lifestyles. Talk with your caregiver first before doing heavy physical activity.  Will you starve?  Not if you follow doctor's orders at each stage of your post-op adjustment. There is no such thing as an "ostomy diet". Some people with an ostomy will be able to eat and tolerate anything; others may find diffi-culty with some foods. Each person is an individual and must determine, by trial, what is best for them. A good practice for all is to drink plenty of water.   Will you be a social outcast?  Have you met anyone who has an ostomy and is a social outcast? Why should you be the first? Only your attitude and self image will effect how you are treated. No confi-dent person is an outcast.    PROFESSIONAL HELP   Resources are available if you need help or have questions about your ostomy.   Specially trained nurses called Wound, Ostomy Continence Nurses (WOCN) are available for  consultation in most major medical centers.  Consider getting an ostomy consult at an outpatient ostomy clinic.   [facility] has an [facility] Clinic run by an WOCN ostomy nurse at the [facility].  [phone]. [facility] can help set up an appointment   The [employer] Ost[employer] Association (UOA) is a group made up of many local chapters throughout the [place]. These local groups hold meetings and provide support to prospective and existing ostomates. They sponsor educational events and have qualified visitors to make personal or telephone visits. Contact the UOA for the chapter nearest you and for other educational publications.  More detailed information can be found in Colostomy Guide, a publication of the [employer] Ostomy Association (UOA). Contact UOA at 1-800-826-0826 or visit their web site at www.uoaa.org. The website contains links to other sites, suppliers and resources.  [facility] Secure Start Services: Start at the website to enlist for support.  Your Wound Ostomy (WOCN) nurse may have started this   process. [email or web address]/securestart Secure Start services are designed to support people as they live their lives with an ostomy or neurogenic bladder. Enrolling is easy and at no cost to the patient. We realize that each person's needs and life journey are different. Through Secure Start services, we want to help people live their life, their way.  #######################################################  

## 2021-01-05 NOTE — Transfer of Care (Signed)
Immediate Anesthesia Transfer of Care Note  Patient: Amber Klein  Procedure(s) Performed: ROBOTIC COLOSTOMY REVERSAL WITH LAPAROSCOPIC LYSIS OF ADHESIONS (Abdomen)  Patient Location: PACU  Anesthesia Type:General  Level of Consciousness: awake, alert , oriented and patient cooperative  Airway & Oxygen Therapy: Patient Spontanous Breathing and Patient connected to face mask  Post-op Assessment: Report given to RN and Post -op Vital signs reviewed and stable  Post vital signs: Reviewed and stable  Last Vitals:  Vitals Value Taken Time  BP 138/90 01/05/21 1127  Temp    Pulse 92 01/05/21 1128  Resp 17 01/05/21 1128  SpO2 95 % 01/05/21 1128  Vitals shown include unvalidated device data.  Last Pain:  Vitals:   01/05/21 0634  TempSrc:   PainSc: 0-No pain         Complications: No notable events documented.

## 2021-01-05 NOTE — Op Note (Signed)
01/05/2021  11:19 AM  PATIENT:  Amber Klein  70 y.o. female  Patient Care Team: Isaac Bliss, Rayford Halsted, MD as PCP - General (Internal Medicine)  PRE-OPERATIVE DIAGNOSIS:  COLOSTOMY IN PLACE  POST-OPERATIVE DIAGNOSIS:  COLOSTOMY IN PLACE  PROCEDURE:  ROBOTIC COLOSTOMY REVERSAL WITH LAPAROSCOPIC LYSIS OF ADHESIONS   Surgeon(s): Leighton Ruff, MD Ileana Roup, MD  ASSISTANT: Dr Dema Severin   ANESTHESIA:   local and general  EBL:  Total I/O In: 1300 [I.V.:1200; IV Piggyback:100] Out: 200 [Blood:200]  Delay start of Pharmacological VTE agent (>24hrs) due to surgical blood loss or risk of bleeding:  no  DRAINS: none   SPECIMEN:  Source of Specimen:  none  DISPOSITION OF SPECIMEN:  PATHOLOGY  COUNTS:  YES  PLAN OF CARE: Admit to inpatient   PATIENT DISPOSITION:  PACU - hemodynamically stable.  INDICATION:    70 y.o. F s/p Hartman's procedure for perforated sigmoid colon ~6 months ago.  The anatomy & physiology of the digestive tract was discussed.  The pathophysiology was discussed.  Natural history risks without surgery was discussed.   I worked to give an overview of the disease and the frequent need to have multispecialty involvement.  I feel the risks of no intervention will lead to serious problems that outweigh the operative risks; therefore, I recommended a partial colectomy to remove the pathology.  Laparoscopic & open techniques were discussed.   Risks such as bleeding, infection, abscess, leak, reoperation, possible ostomy, hernia, heart attack, death, and other risks were discussed.  I noted a good likelihood this will help address the problem.   Goals of post-operative recovery were discussed as well.    The patient expressed understanding & wished to proceed with surgery.  OR FINDINGS:   Patient had moderate sized incisional hernia with a wide mouth.  Significant midline adhesions were noted.  The anastomosis rests ~9 cm from the anal  verge by rigid proctoscopy.  DESCRIPTION:   Informed consent was confirmed.  The patient underwent general anaesthesia without difficulty.  The patient was positioned appropriately.  VTE prevention in place.  The patient's abdomen was clipped, prepped, & draped in a sterile fashion.  Surgical timeout confirmed our plan.  The patient was positioned in reverse Trendelenburg.  Abdominal entry was gained using a Varies needle in the LUQ.  Entry was clean.  I induced carbon dioxide insufflation.  An 61mm robotic port was placed in the RUQ.  Camera inspection revealed no injury.  Extra ports were carefully placed under direct laparoscopic visualization.  There were significant small bowel adhesions to the abdominal midline.  These were taken down using blunt dissection and laparoscopic scissors.  The omental adhesions were divided with scissors as well.  I continue to lyse small bowel adhesions to allow for trocar movement within the abdomen.  I laparoscopically reflected the greater omentum and the upper abdomen the small bowel in the upper abdomen. The patient was appropriately positioned and the robot was docked to the patient's left side.  Instruments were placed under direct visualization.   I continue to lyse interloop adhesions starting at the terminal ileum and working my way back towards the ligament of Treitz.  This took approximately 30 to 40 minutes.  This was done using robotic scissors.  Once all the small bowel was mobilized this was placed into the right upper quadrant and the rectal stump was evaluated.  I dissected bluntly underneath the mesentery to the presacral plane.  I divided peritoneum along the  right and left side to allow for mobilization of the rectal stump.  There appeared to be only rectum left with no remaining sigmoid colon.  I divided the mesentery of the edge of the rectum to get back to nice fresh healthy tissue.  I then mobilized the colostomy up the white line of Toldt to the  level of the splenic flexure to allow for mobilization into the pelvis.  At this point the robot was undocked.  The remaining colostomy attachments to the subcutaneous tissue were taken down using blunt dissection and electrocautery.  Hemostasis was obtained as we went.  There was a small rectus muscle bleeding vessel that was controlled using electrocautery.  Once the entire colostomy was free the end of the colostomy was separated over a pursestring device.  A 2-0 Prolene pursestring was placed.  A 29 mm EEA anvil was placed into the colon and the pursestring was tied tightly around this.  This was placed back into the abdomen.  It easily reached into the pelvis.  I then exteriorized the small bowel from the terminal ileum to the ligament of Treitz to evaluate for any injury during lysis of adhesions.  There was a small serosal defect that was closed using interrupted 3-0 silk suture.  The remaining small bowel appeared without injury.  This was then placed back into the abdomen.  The Alexis wound protector and cap were placed and the abdomen was reinsufflated.  An anastomosis was created using a 29 mm EEA stapler through the end of the rectal stump.  There was no tension noted on the anastomosis.  There was no leak when tested with insufflation under irrigation.  The anastomosis rest approximately 9 cm from the anal verge by rigid proctoscopy.  I used the camera to laparoscopically evaluate the patient's incisional hernia.  This was approximately 8 to 9 cm in width.  This did not appear to be a potential cause of obstruction in the near future.  I did not feel that this could be primarily repaired and therefore it was left alone.  Once this was complete the [place]on wound protector was removed.  The fascia of the colostomy site was closed using interrupted 0 and #1 Novafil sutures.  The subcutaneous tissue was closed over this using a pursestring 2-0 Vicryl suture.  The dermal layer was partially approximated  using a 2-0 Vicryl pursestring suture.  A Telfa wick was placed in the middle of the wound.  The remaining port sites were closed using interrupted 4-0 Vicryl sutures and Dermabond.  A dressing was placed over the colostomy site.  The patient was then awakened from anesthesia and sent the postanesthesia care unit in stable condition.  All counts were correct per operating room staff.   An MD assistant was necessary for tissue manipulation, retraction and positioning due to the complexity of the case and hospital policies

## 2021-01-05 NOTE — H&P (Signed)
H&P Note   Amber Klein 10-18-1950  [id number].     HPI:  Patient is a 70 year old female who underwent Hartmann's procedure on 04/10/20 for perforated sigmoid diverticulitis with septic shock. She was in [place], [place] and presented to the local hospital there. She was discharged home 05/05/20. She has been followed by me in our office for colostomy reversal. Prior to this a colonoscopy was recommended. Colonoscopy was attempted 11/11/20 but stoma was unable to be traversed by scope.    PMH otherwise significant for GERD, Hx of RBBB, Hx of skin cancer, pre-diabetes. Past abdominal surgeries other than above include cesarean section, open right inguinal hernia repair. Patient has allergies to iodinated contrast agents, vancomycin, cipro, sulfa abx, azithromycin and latex. She is not on any blood thinning medications.   ROS: Review of Systems  Constitutional:  No fever. Negative for chills.  Respiratory:  Negative for shortness of breath and wheezing.   Cardiovascular:  Negative for chest pain and palpitations.  Gastrointestinal:  Negative for diarrhea, abd pain.  All other systems reviewed and are negative.        Family History  Problem Relation Age of Onset   Dementia Mother     Kidney cancer Father     Breast cancer Maternal Grandmother     Colon cancer Neg Hx     Esophageal cancer Neg Hx     Pancreatic cancer Neg Hx     Stomach cancer Neg Hx            Past Medical History:  Diagnosis Date   Arthritis     Cancer ([place])      basal cell skin cancer   Complication of anesthesia      "I went into shock". Decreased body temperature., WILL ONLY HAVE SURGERY WITH SPINAL ANESTHESIA ONLY!!!   Dysrhythmia     Ear infection     Environmental and seasonal allergies     Femoral hernia of right side with obstruction 01/18/2018   GERD (gastroesophageal reflux disease)      no longer having issues takes TUMS occ.   History of kidney stones     Hypotension     Irritant contact  dermatitis associated with stoma 06/25/2020   Pneumonia 02/2016    walking pneumonia    Pre-diabetes     RBBB     Sinus infection             Past Surgical History:  Procedure Laterality Date   CATARACT EXTRACTION       CESAREAN SECTION       CO2 LASER APPLICATION N/A 2/54/2706    Procedure: CO2 LASER APPLICATION;  Surgeon: Vanessa Kick, MD;  Location: [facility];  Service: Gynecology;  Laterality: N/A;   COLON SURGERY       COLONOSCOPY       COLONOSCOPY WITH PROPOFOL N/A 11/11/2020    Procedure: COLONOSCOPY WITH PROPOFOL;  Surgeon: Milus Banister, MD;  Location: [facility];  Service: Endoscopy;  Laterality: N/A;  NEEDS PREVISIT WITH ANESTHESIA   COLOSTOMY       FOOT SURGERY       INGUINAL HERNIA REPAIR Right 01/18/2018    Procedure: OPEN REPAIR INCARCERATED RIGHT FEMORAL HERNIA WITH MESH;  Surgeon: Fanny Skates, MD;  Location: [facility];  Service: General;  Laterality: Right;  GENERAL / TAP BLOCK   KNEE SURGERY       ORIF ELBOW FRACTURE Left 07/04/2014    Procedure: OPEN REDUCTION INTERNAL FIXATION (ORIF) ELBOW/OLECRANON  FRACTURE;  Surgeon: Meredith Pel, MD;  Location: [facility];  Service: Orthopedics;  Laterality: Left;   REFRACTIVE SURGERY       VULVECTOMY N/A 08/10/2016    Procedure: WIDE EXCISION VULVECTOMY;  Surgeon: Vanessa Kick, MD;  Location: [facility];  Service: Gynecology;  Laterality: N/A;      Social History:  reports that she quit smoking about 6 years ago. Her smoking use included cigarettes. She has a 4.00 pack-year smoking history. She has never used smokeless tobacco. She reports current alcohol use. She reports that she does not use drugs.   Allergies:       Allergies  Allergen Reactions   Iodinated Diagnostic Agents Anaphylaxis   Propofol        Couldn't wake patient up   Shellfish Allergy Anaphylaxis   Vancomycin Anaphylaxis   Ciprofloxacin Hives and Swelling      Hives   Sulfa Antibiotics Swelling and Other (See Comments)      Migraine   Zithromax  [Azithromycin] Hives   Iodine        UNSPECIFIED REACTION    Latex Rash      Sensitivity on too long      Today's Vitals   01/05/21 0547 01/05/21 0634  BP: 114/81   Pulse: 71   Resp: 18   Temp: 99.5 F (37.5 C)   TempSrc: Oral   SpO2: 96%   Weight:  73 kg  Height:  5' 4.5" (1.638 m)  PainSc:  0-No pain   Body mass index is 27.2 kg/m.  Physical Exam:  General: pleasant, WD, WN female who is laying in bed in NAD HEENT: head is normocephalic, atraumatic.  Sclera are noninjected. Ears and nose without any masses or lesions.  Mouth is pink and moist Heart: regular, rate, and rhythm. Palpable radial and pedal pulses bilaterally Lungs: No audible wheezing Respiratory effort nonlabored Abd: soft, mild generalized ttp, moderate distention, midline surgical scar well healed, stoma in LLQ with soft brown stool in ostomy appliance MS: all 4 extremities are symmetrical with no cyanosis, clubbing, or edema. Skin: warm and dry with no masses, lesions, or rashes Neuro: Cranial nerves 2-12 grossly intact, sensation is normal throughout Psych: A&Ox3 with an appropriate affect.     Lab Results Last 48 Hours        Results for orders placed or performed during the hospital encounter of 12/08/20 (from the past 48 hour(s))  Lipase, blood     Status: None    Collection Time: 12/08/20  9:23 AM  Result Value Ref Range    Lipase 18 11 - 51 U/L      Comment: Performed at [facility], [place],  [address]  Comprehensive metabolic panel     Status: Abnormal    Collection Time: 12/08/20  9:23 AM  Result Value Ref Range    Sodium 139 135 - 145 mmol/L    Potassium 3.1 (L) 3.5 - 5.1 mmol/L    Chloride 103 98 - 111 mmol/L    CO2 26 22 - 32 mmol/L    Glucose, Bld 111 (H) 70 - 99 mg/dL      Comment: Glucose reference range applies only to samples taken after fasting for at least 8 hours.    BUN 10 8 - 23 mg/dL    Creatinine, Ser 0.80 0.44 - 1.00 mg/dL     Calcium 9.4 8.9 - 10.3 mg/dL    Total Protein 6.6 6.5 - 8.1 g/dL  Albumin 3.9 3.5 - 5.0 g/dL    AST 17 15 - 41 U/L    ALT 21 0 - 44 U/L    Alkaline Phosphatase 69 38 - 126 U/L    Total Bilirubin 0.4 0.3 - 1.2 mg/dL    GFR, Estimated >60 >60 mL/min      Comment: (NOTE) Calculated using the CKD-EPI Creatinine Equation (2021)      Anion gap 10 5 - 15      Comment: Performed at [facility], [address] [place], [place] [address]  CBC     Status: None    Collection Time: 12/08/20  9:23 AM  Result Value Ref Range    WBC 9.1 4.0 - 10.5 K/uL    RBC 4.82 3.87 - 5.11 MIL/uL    Hemoglobin 13.7 12.0 - 15.0 g/dL    HCT 42.4 36.0 - 46.0 %    MCV 88.0 80.0 - 100.0 fL    MCH 28.4 26.0 - 34.0 pg    MCHC 32.3 30.0 - 36.0 g/dL    RDW 13.5 11.5 - 15.5 %    Platelets 298 150 - 400 K/uL    nRBC 0.0 0.0 - 0.2 %      Comment: Performed at [facility], [address], [place], [place] [address]  Resp Panel by RT-PCR (Flu A&B, Covid) Nasopharyngeal Swab     Status: None    Collection Time: 12/08/20  9:59 AM    Specimen: Nasopharyngeal Swab; Nasopharyngeal(NP) swabs in vial transport medium  Result Value Ref Range    SARS Coronavirus 2 by RT PCR NEGATIVE NEGATIVE      Comment: (NOTE) SARS-CoV-2 target nucleic acids are NOT DETECTED.   The SARS-CoV-2 RNA is generally detectable in upper respiratory specimens during the acute phase of infection. The lowest concentration of SARS-CoV-2 viral copies this assay can detect is 138 copies/mL. A negative result does not preclude SARS-Cov-2 infection and should not be used as the sole basis for treatment or other patient management decisions. A negative result may occur with  improper specimen collection/handling, submission of specimen other than nasopharyngeal swab, presence of viral mutation(s) within the areas targeted by this assay, and inadequate number of viral copies(<138 copies/mL). A negative result  must be combined with clinical observations, patient history, and epidemiological information. The expected result is Negative.   Fact Sheet for Patients:  EntrepreneurPulse.com.au   Fact Sheet for Healthcare Providers:  IncredibleEmployment.be   This test is no t yet approved or cleared by the [place] FDA and  has been authorized for detection and/or diagnosis of SARS-CoV-2 by FDA under an Emergency Use Authorization (EUA). This EUA will remain  in effect (meaning this test can be used) for the duration of the COVID-19 declaration under Section 564(b)(1) of the Act, 21 U.S.C.section 360bbb-3(b)(1), unless the authorization is terminated  or revoked sooner.           Influenza A by PCR NEGATIVE NEGATIVE    Influenza B by PCR NEGATIVE NEGATIVE      Comment: (NOTE) The Xpert Xpress SARS-CoV-2/FLU/RSV plus assay is intended as an aid in the diagnosis of influenza from Nasopharyngeal swab specimens and should not be used as a sole basis for treatment. Nasal washings and aspirates are unacceptable for Xpert Xpress SARS-CoV-2/FLU/RSV testing.   Fact Sheet for Patients: EntrepreneurPulse.com.au   Fact Sheet for Healthcare Providers: IncredibleEmployment.be   This test is not yet approved or cleared by the [place] FDA and has been  authorized for detection and/or diagnosis of SARS-CoV-2 by FDA under an Emergency Use Authorization (EUA). This EUA will remain in effect (meaning this test can be used) for the duration of the COVID-19 declaration under Section 564(b)(1) of the Act, 21 U.S.C. section 360bbb-3(b)(1), unless the authorization is terminated or revoked.   Performed at [facility], [address], [place], [place] [address]         Imaging Results (Last 26 hours)           Assessment/Plan Hx of Hartmann's for perforated sigmoid diverticulitis at OSH  04/10/20  70 y.o. F s/p Hartman's with extended hospitalization at OSH.  She has now recovered.  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  She is here for colostomy reversal.  The surgery and anatomy were described to the patient as well as the risks of surgery and the possible complications.  These include: Bleeding, deep abdominal infections and possible wound complications such as hernia and infection, damage to adjacent structures, leak of surgical connections, which can lead to other surgeries and possibly an ostomy, possible need for other procedures, such as abscess drains in radiology, possible prolonged hospital stay, possible diarrhea from removal of part of the colon, possible constipation from narcotics, prolonged fatigue/weakness or appetite loss, possible early recurrence of of disease, possible complications of their medical problems such as heart disease or arrhythmias or lung problems, death (less than 1%). I believe the patient understands and wishes to proceed with the surgery.

## 2021-01-05 NOTE — Anesthesia Postprocedure Evaluation (Signed)
Anesthesia Post Note  Patient: Elvis Coil Inocencio  Procedure(s) Performed: ROBOTIC COLOSTOMY REVERSAL WITH LAPAROSCOPIC LYSIS OF ADHESIONS (Abdomen)     Patient location during evaluation: PACU Anesthesia Type: General Level of consciousness: awake and alert Pain management: pain level controlled Vital Signs Assessment: post-procedure vital signs reviewed and stable Respiratory status: spontaneous breathing, nonlabored ventilation, respiratory function stable and patient connected to nasal cannula oxygen Cardiovascular status: blood pressure returned to baseline and stable Postop Assessment: no apparent nausea or vomiting Anesthetic complications: no   No notable events documented.  Last Vitals:  Vitals:   01/05/21 0547  BP: 114/81  Pulse: 71  Resp: 18  Temp: 37.5 C  SpO2: 96%    Last Pain:  Vitals:   01/05/21 0634  TempSrc:   PainSc: 0-No pain                 Tod Abrahamsen

## 2021-01-06 ENCOUNTER — Encounter (HOSPITAL_COMMUNITY): Payer: Self-pay | Admitting: General Surgery

## 2021-01-06 LAB — CBC
HCT: 35.6 % — ABNORMAL LOW (ref 36.0–46.0)
Hemoglobin: 11.5 g/dL — ABNORMAL LOW (ref 12.0–15.0)
MCH: 29.3 pg (ref 26.0–34.0)
MCHC: 32.3 g/dL (ref 30.0–36.0)
MCV: 90.8 fL (ref 80.0–100.0)
Platelets: 257 10*3/uL (ref 150–400)
RBC: 3.92 MIL/uL (ref 3.87–5.11)
RDW: 13.9 % (ref 11.5–15.5)
WBC: 10.2 10*3/uL (ref 4.0–10.5)
nRBC: 0 % (ref 0.0–0.2)

## 2021-01-06 LAB — BASIC METABOLIC PANEL
Anion gap: 4 — ABNORMAL LOW (ref 5–15)
BUN: 9 mg/dL (ref 8–23)
CO2: 26 mmol/L (ref 22–32)
Calcium: 8.4 mg/dL — ABNORMAL LOW (ref 8.9–10.3)
Chloride: 103 mmol/L (ref 98–111)
Creatinine, Ser: 0.72 mg/dL (ref 0.44–1.00)
GFR, Estimated: >60 mL/min (ref >60)
Glucose, Bld: 120 mg/dL — ABNORMAL HIGH (ref 70–99)
Potassium: 4 mmol/L (ref 3.5–5.1)
Sodium: 133 mmol/L — ABNORMAL LOW (ref 135–145)

## 2021-01-06 NOTE — Progress Notes (Signed)
    Assessment & Plan: POD#1 - status post ROBOTIC COLOSTOMY REVERSAL WITH LAPAROSCOPIC LYSIS OF ADHESIONS - Dr. Leighton Ruff, 12/16/1592  Sips clear liquids, IV heplock  Encouraged OOB, ambulation  Will change dressing in AM, remove wick 11/25        Amber Gemma, MD       [facility]       Office: [phone]   Chief Complaint: Colostomy takedown  Subjective: Patient pleasant, no complaints.  Denies flatus or BM.  Objective: Vital signs in last 24 hours: Temp:  [97.4 F (36.3 C)-99.4 F (37.4 C)] 99.1 F (37.3 C) (11/24 0535) Pulse Rate:  [74-93] 81 (11/24 0535) Resp:  [14-19] 18 (11/24 0535) BP: (93-131)/(63-80) 100/65 (11/24 0535) SpO2:  [94 %-100 %] 95 % (11/24 0535) Last BM Date: 01/04/21  Intake/Output from previous day: 11/23 0701 - 11/24 0700 In: 3160 [P.O.:660; I.V.:2400; IV Piggyback:100] Out: 2050 [Urine:1650; Emesis/NG output:200; Blood:200] Intake/Output this shift: No intake/output data recorded.  Physical Exam: HEENT - sclerae clear, mucous membranes moist Neck - soft Abdomen - soft, wounds dry and intact; BS present; dressing dry and intact Ext - no edema, non-tender Neuro - alert & oriented, no focal deficits  Lab Results:  Recent Labs    01/06/21 0429  WBC 10.2  HGB 11.5*  HCT 35.6*  PLT 257   BMET Recent Labs    01/06/21 0429  NA 133*  K 4.0  CL 103  CO2 26  GLUCOSE 120*  BUN 9  CREATININE 0.72  CALCIUM 8.4*   PT/INR No results for input(s): LABPROT, INR in the last 72 hours. Comprehensive Metabolic Panel:    Component Value Date/Time   NA 133 (L) 01/06/2021 0429   NA 136 12/29/2020 0934   K 4.0 01/06/2021 0429   K 4.8 12/29/2020 0934   CL 103 01/06/2021 0429   CL 99 12/29/2020 0934   CO2 26 01/06/2021 0429   CO2 30 12/29/2020 0934   BUN 9 01/06/2021 0429   BUN 19 12/29/2020 0934   CREATININE 0.72 01/06/2021 0429   CREATININE 0.81 12/29/2020 0934   GLUCOSE 120 (H) 01/06/2021 0429   GLUCOSE 104  (H) 12/29/2020 0934   CALCIUM 8.4 (L) 01/06/2021 0429   CALCIUM 10.0 12/29/2020 0934   AST 17 12/08/2020 0923   AST 20 07/16/2020 1630   ALT 21 12/08/2020 0923   ALT 19 07/16/2020 1630   ALKPHOS 69 12/08/2020 0923   ALKPHOS 93 07/16/2020 1630   BILITOT 0.4 12/08/2020 0923   BILITOT 0.2 07/16/2020 1630   PROT 6.6 12/08/2020 0923   PROT 6.9 07/16/2020 1630   ALBUMIN 3.9 12/08/2020 0923   ALBUMIN 3.9 07/16/2020 1630    Studies/Results: No results found.    Amber Klein 01/06/2021   Patient ID: Amber Klein, female   DOB: 11-09-1950, 70 y.o.   MRN: [id number]

## 2021-01-07 LAB — CBC
HCT: 36.9 % (ref 36.0–46.0)
Hemoglobin: 11.7 g/dL — ABNORMAL LOW (ref 12.0–15.0)
MCH: 29.3 pg (ref 26.0–34.0)
MCHC: 31.7 g/dL (ref 30.0–36.0)
MCV: 92.3 fL (ref 80.0–100.0)
Platelets: 257 10*3/uL (ref 150–400)
RBC: 4 MIL/uL (ref 3.87–5.11)
RDW: 13.9 % (ref 11.5–15.5)
WBC: 9.8 10*3/uL (ref 4.0–10.5)
nRBC: 0 % (ref 0.0–0.2)

## 2021-01-07 LAB — BASIC METABOLIC PANEL
Anion gap: 6 (ref 5–15)
BUN: 9 mg/dL (ref 8–23)
CO2: 28 mmol/L (ref 22–32)
Calcium: 8.7 mg/dL — ABNORMAL LOW (ref 8.9–10.3)
Chloride: 102 mmol/L (ref 98–111)
Creatinine, Ser: 0.55 mg/dL (ref 0.44–1.00)
GFR, Estimated: >60 mL/min (ref >60)
Glucose, Bld: 100 mg/dL — ABNORMAL HIGH (ref 70–99)
Potassium: 4.2 mmol/L (ref 3.5–5.1)
Sodium: 136 mmol/L (ref 135–145)

## 2021-01-07 NOTE — Progress Notes (Signed)
    Assessment & Plan: POD#2 - status post ROBOTIC COLOSTOMY REVERSAL WITH LAPAROSCOPIC LYSIS OF ADHESIONS - Dr. Leighton Ruff, 22/48/2500             Tolerating full liquids, advance to regular diet today             Encouraged OOB, ambulation             Kiln out, dressing changed        [name], MD       [facility]       Office: [phone]   Chief Complaint: Colostomy closure  Subjective: Patient in bed, comfortable.  Diarrhea this AM.  Tolerating full liquid diet.  Objective: Vital signs in last 24 hours: Temp:  [97.8 F (36.6 C)-99.8 F (37.7 C)] 98.6 F (37 C) (11/25 9450) Pulse Rate:  [83-95] 83 (11/25 0632) Resp:  [14-18] 16 (11/25 3888) BP: (93-108)/(57-72) 96/62 (11/25 2800) SpO2:  [93 %-96 %] 95 % (11/25 3491) Weight:  [75.2 kg] 75.2 kg (11/25 0500) Last BM Date: 01/06/21  Intake/Output from previous day: 11/24 0701 - 11/25 0700 In: 1200 [P.O.:1200] Out: 900 [Urine:900] Intake/Output this shift: No intake/output data recorded.  Physical Exam: HEENT - sclerae clear, mucous membranes moist Neck - soft Abdomen - soft, wick removed from ostomy site, dressing changed Ext - no edema, non-tender Neuro - alert & oriented, no focal deficits  Lab Results:  Recent Labs    01/06/21 0429 01/07/21 0355  WBC 10.2 9.8  HGB 11.5* 11.7*  HCT 35.6* 36.9  PLT 257 257   BMET Recent Labs    01/06/21 0429 01/07/21 0355  NA 133* 136  K 4.0 4.2  CL 103 102  CO2 26 28  GLUCOSE 120* 100*  BUN 9 9  CREATININE 0.72 0.55  CALCIUM 8.4* 8.7*   PT/INR No results for input(s): LABPROT, INR in the last 72 hours. Comprehensive Metabolic Panel:    Component Value Date/Time   NA 136 01/07/2021 0355   NA 133 (L) 01/06/2021 0429   K 4.2 01/07/2021 0355   K 4.0 01/06/2021 0429   CL 102 01/07/2021 0355   CL 103 01/06/2021 0429   CO2 28 01/07/2021 0355   CO2 26 01/06/2021 0429   BUN 9 01/07/2021 0355   BUN 9 01/06/2021 0429   CREATININE  0.55 01/07/2021 0355   CREATININE 0.72 01/06/2021 0429   GLUCOSE 100 (H) 01/07/2021 0355   GLUCOSE 120 (H) 01/06/2021 0429   CALCIUM 8.7 (L) 01/07/2021 0355   CALCIUM 8.4 (L) 01/06/2021 0429   AST 17 12/08/2020 0923   AST 20 07/16/2020 1630   ALT 21 12/08/2020 0923   ALT 19 07/16/2020 1630   ALKPHOS 69 12/08/2020 0923   ALKPHOS 93 07/16/2020 1630   BILITOT 0.4 12/08/2020 0923   BILITOT 0.2 07/16/2020 1630   PROT 6.6 12/08/2020 0923   PROT 6.9 07/16/2020 1630   ALBUMIN 3.9 12/08/2020 0923   ALBUMIN 3.9 07/16/2020 1630    Studies/Results: No results found.    [name] 01/07/2021   Patient ID: Amber Klein, female   DOB: 11-24-1950, 70 y.o.   MRN: [id number]

## 2021-01-07 NOTE — Progress Notes (Signed)
Pharmacy Brief Note - Alvimopan (Entereg)  The standing order set for alvimopan (Entereg) now includes an automatic order to discontinue the drug after the patient has had a bowel movement. The change was approved by the Pharmacy & Therapeutics Committee and the Medical Executive Committee.  This patient has had a bowel movement documented by nursing. Therefore, alvimopan has been discontinued. If there are questions, please contact the pharmacy at [phone].  Thank you    

## 2021-01-08 LAB — BASIC METABOLIC PANEL
Anion gap: 6 (ref 5–15)
BUN: 11 mg/dL (ref 8–23)
CO2: 28 mmol/L (ref 22–32)
Calcium: 8.6 mg/dL — ABNORMAL LOW (ref 8.9–10.3)
Chloride: 100 mmol/L (ref 98–111)
Creatinine, Ser: 0.59 mg/dL (ref 0.44–1.00)
GFR, Estimated: >60 mL/min (ref >60)
Glucose, Bld: 90 mg/dL (ref 70–99)
Potassium: 3.6 mmol/L (ref 3.5–5.1)
Sodium: 134 mmol/L — ABNORMAL LOW (ref 135–145)

## 2021-01-08 LAB — CBC
HCT: 33.7 % — ABNORMAL LOW (ref 36.0–46.0)
Hemoglobin: 10.8 g/dL — ABNORMAL LOW (ref 12.0–15.0)
MCH: 29.3 pg (ref 26.0–34.0)
MCHC: 32 g/dL (ref 30.0–36.0)
MCV: 91.6 fL (ref 80.0–100.0)
Platelets: 263 10*3/uL (ref 150–400)
RBC: 3.68 MIL/uL — ABNORMAL LOW (ref 3.87–5.11)
RDW: 14.2 % (ref 11.5–15.5)
WBC: 6.8 10*3/uL (ref 4.0–10.5)
nRBC: 0 % (ref 0.0–0.2)

## 2021-01-08 NOTE — Progress Notes (Signed)
Mobility Specialist - Progress Note    01/08/21 1402  Mobility  Activity Ambulated in hall  Level of Assistance Modified independent, requires aide device or extra time  Assistive Device Other (Comment) (Hand held assist)  Distance Ambulated (ft) 300 ft  Mobility Ambulated independently in hallway  Mobility Response Tolerated well  Mobility performed by Mobility specialist  $Mobility charge 1 Mobility   Pt agreeable to mobilize this afternoon and required hand held assist to ambulate ~300 ft in hallway. No complaints made during session. Pt returned to room to use bathroom and was then left in bed with call bell at side.   [place] Specialist Acute Rehabilitation Services Phone: [phone] 01/08/21, 2:04 PM

## 2021-01-08 NOTE — Progress Notes (Signed)
    Assessment & Plan: POD#3 - status post ROBOTIC COLOSTOMY REVERSAL WITH LAPAROSCOPIC LYSIS OF ADHESIONS - Dr. Leighton Ruff, 16/11/9602             Tolerating regular diet, still with loose BM's             Encouraged OOB, ambulation             Dressing changed this AM at ostomy site  Moderate to severe pain left mid abd wall with movement  Hgb down slightly - repeat CBC in AM 11/27  Encouraged OOB, ambulation today.  Continue regular diet.  Planning home tomorrow.        [name], MD       [facility]       Office: [phone]   Chief Complaint: Colostomy closure  Subjective: Patient in bed, complains of severe left abd pain with movement. Loose BM's.  Objective: Vital signs in last 24 hours: Temp:  [98.8 F (37.1 C)-99.3 F (37.4 C)] 98.8 F (37.1 C) (11/26 0555) Pulse Rate:  [81-95] 81 (11/26 0555) Resp:  [17-18] 18 (11/26 0555) BP: (93-120)/(62-84) 120/84 (11/26 0555) SpO2:  [94 %-97 %] 94 % (11/26 0555) Weight:  [75.5 kg] 75.5 kg (11/26 0500) Last BM Date: 01/07/21  Intake/Output from previous day: 11/25 0701 - 11/26 0700 In: 1440 [P.O.:1440] Out: 2400 [Urine:2400] Intake/Output this shift: Total I/O In: 240 [P.O.:240] Out: -   Physical Exam: HEENT - sclerae clear, mucous membranes moist Neck - soft Abdomen - soft, non-distended; tender to palpation left side of abdomen; dressing dry Ext - no edema, non-tender Neuro - alert & oriented, no focal deficits  Lab Results:  Recent Labs    01/07/21 0355 01/08/21 0443  WBC 9.8 6.8  HGB 11.7* 10.8*  HCT 36.9 33.7*  PLT 257 263   BMET Recent Labs    01/07/21 0355 01/08/21 0443  NA 136 134*  K 4.2 3.6  CL 102 100  CO2 28 28  GLUCOSE 100* 90  BUN 9 11  CREATININE 0.55 0.59  CALCIUM 8.7* 8.6*   PT/INR No results for input(s): LABPROT, INR in the last 72 hours. Comprehensive Metabolic Panel:    Component Value Date/Time   NA 134 (L) 01/08/2021 0443   NA 136 01/07/2021  0355   K 3.6 01/08/2021 0443   K 4.2 01/07/2021 0355   CL 100 01/08/2021 0443   CL 102 01/07/2021 0355   CO2 28 01/08/2021 0443   CO2 28 01/07/2021 0355   BUN 11 01/08/2021 0443   BUN 9 01/07/2021 0355   CREATININE 0.59 01/08/2021 0443   CREATININE 0.55 01/07/2021 0355   GLUCOSE 90 01/08/2021 0443   GLUCOSE 100 (H) 01/07/2021 0355   CALCIUM 8.6 (L) 01/08/2021 0443   CALCIUM 8.7 (L) 01/07/2021 0355   AST 17 12/08/2020 0923   AST 20 07/16/2020 1630   ALT 21 12/08/2020 0923   ALT 19 07/16/2020 1630   ALKPHOS 69 12/08/2020 0923   ALKPHOS 93 07/16/2020 1630   BILITOT 0.4 12/08/2020 0923   BILITOT 0.2 07/16/2020 1630   PROT 6.6 12/08/2020 0923   PROT 6.9 07/16/2020 1630   ALBUMIN 3.9 12/08/2020 0923   ALBUMIN 3.9 07/16/2020 1630    Studies/Results: No results found.    [name] 01/08/2021   Patient ID: Amber Klein, female   DOB: June 07, 1950, 70 y.o.   MRN: [id number]

## 2021-01-08 NOTE — Progress Notes (Addendum)
Mobility Specialist - Progress Note    01/08/21 1001  Mobility  Activity Ambulated in hall  Level of Assistance Modified independent, requires aide device or extra time  Assistive Device None (Hand Held Assist)  Distance Ambulated (ft) 300 ft  Mobility Ambulated independently in hallway  Mobility Response Tolerated well  Mobility performed by Mobility specialist  $Mobility charge 1 Mobility   Pt agreeable to mobilizing and practiced log roll technique to get out of bed. Pt insisted on not using AD to ambulate, but did request for hand held assist while ambulating 300 ft in hallway. No complaints made during session. Pt returned to room to sit up in bed and was left with call bell at side.   [place] Specialist Acute Rehabilitation Services Phone: [phone] 01/08/21, 10:03 AM

## 2021-01-09 LAB — CBC
HCT: 34.5 % — ABNORMAL LOW (ref 36.0–46.0)
Hemoglobin: 11.5 g/dL — ABNORMAL LOW (ref 12.0–15.0)
MCH: 29.9 pg (ref 26.0–34.0)
MCHC: 33.3 g/dL (ref 30.0–36.0)
MCV: 89.8 fL (ref 80.0–100.0)
Platelets: 297 10*3/uL (ref 150–400)
RBC: 3.84 MIL/uL — ABNORMAL LOW (ref 3.87–5.11)
RDW: 14.2 % (ref 11.5–15.5)
WBC: 6.9 10*3/uL (ref 4.0–10.5)
nRBC: 0 % (ref 0.0–0.2)

## 2021-01-09 MED ORDER — TRAMADOL HCL 50 MG PO TABS: 50.0000 mg | ORAL_TABLET | Freq: Four times a day (QID) | ORAL | 0 refills | Status: DC | PRN

## 2021-01-09 NOTE — Discharge Summary (Signed)
Physician Discharge Summary   Patient ID: Amber Klein MRN: [id number] DOB/AGE: 68/29/1952 68 y.o.  Admit date: 01/05/2021  Discharge date: 01/09/2021  Discharge Diagnoses:  Principal Problem:   Colostomy status [facility])   Discharged Condition: good  Hospital Course: Patient was admitted for observation following colostomy closure surgery.  Post op course was uncomplicated.  Pain was well controlled.  Tolerated diet.  Patient was prepared for discharge home on POD#4.   Consults: None  Treatments: surgery: laparoscopic colostomy reversal, Dr. Leighton Ruff, 18/56/3149  Discharge Exam: Blood pressure 115/80, pulse 83, temperature 97.9 F (36.6 C), temperature source Oral, resp. rate 15, height 5' 4.5" (1.638 m), weight 75.2 kg, SpO2 93 %. HEENT - clear Neck - soft Abd - soft without distension; dressing dry and intact  Disposition: Home  Discharge Instructions     Diet - low sodium heart healthy   Complete by: As directed    Discharge wound care:   Complete by: As directed    May shower.  Change dressing to colostomy site with dry gauze bandage as needed.   Increase activity slowly   Complete by: As directed       Allergies as of 01/09/2021       Reactions   Iodinated Diagnostic Agents Anaphylaxis   Shellfish Allergy Anaphylaxis   Vancomycin Anaphylaxis   Wasp Venom Anaphylaxis   Ciprofloxacin Hives, Swelling   Hives   Sulfa Antibiotics Swelling, Other (See Comments)   Migraine   Zithromax [azithromycin] Hives   Latex Rash   Sensitivity on too long   Propofol    Couldn't wake patient up - needs lower doses         Medication List     STOP taking these medications    acetaminophen 325 MG tablet Commonly known as: TYLENOL   oxyCODONE 5 MG immediate release tablet Commonly known as: Oxy IR/ROXICODONE       TAKE these medications    albuterol 108 (90 Base) MCG/ACT inhaler Commonly known as: VENTOLIN HFA INHALE 2 PUFFS INTO THE  LUNGS EVERY 6 HOURS AS NEEDED FOR WHEEZING OR SHORTNESS OF BREATH   fexofenadine 180 MG tablet Commonly known as: ALLEGRA Take 180 mg by mouth daily.   latanoprost 0.005 % ophthalmic solution Commonly known as: XALATAN Place 1 drop into both eyes daily.   multivitamin with minerals Tabs tablet Take 1 tablet by mouth every other day.   PHILLIPS COLON HEALTH PO Take 1 capsule by mouth daily.   polyethylene glycol 17 g packet Commonly known as: MiraLax Take 17 g by mouth 2 (two) times daily as needed for mild constipation.   traMADol 50 MG tablet Commonly known as: ULTRAM Take 1-2 tablets (50-100 mg total) by mouth every 6 (six) hours as needed.   Vitamin D 50 MCG (2000 UT) tablet Take 2,000 Units by mouth daily.               Discharge Care Instructions  (From admission, onward)           Start     Ordered   01/09/21 0000  Discharge wound care:       Comments: May shower.  Change dressing to colostomy site with dry gauze bandage as needed.   01/09/21 7026            Follow-up Information     Leighton Ruff, MD Follow up.   Specialties: General Surgery, Colon and Rectal Surgery Contact information: [address]  [place] [address] [phone]                 Irven Ingalsbe, [facility] Office: [phone]   Signed: [name] 01/09/2021, 8:14 AM

## 2021-01-09 NOTE — Plan of Care (Signed)

## 2021-01-09 NOTE — Progress Notes (Signed)
Assessment unchanged. Pt verbalized understanding of dc instructions through teach back. Discharged via wc to front entrance accompanied by NT.

## 2021-01-10 ENCOUNTER — Telehealth: Payer: Self-pay

## 2021-01-11 ENCOUNTER — Telehealth: Payer: Self-pay

## 2021-01-11 NOTE — Telephone Encounter (Signed)
Transition Care Management Follow-up Telephone Call Date of discharge and from where: 01/09/21 from [facility] How have you been since you were released from the hospital? Pt states she's feeling "really good". Was admitted for ostomy reversal. Any questions or concerns? No  Items Reviewed: Did the pt receive and understand the discharge instructions provided? Yes  Medications obtained and verified? Yes  Other? No  Any new allergies since your discharge? No  Dietary orders reviewed? Yes Do you have support at home? Yes   Home Care and Equipment/Supplies: Were home health services ordered? not applicable Were any new equipment or medical supplies ordered?  No   Functional Questionnaire: (I = Independent and D = Dependent) ADLs: I  Bathing/Dressing- I  Meal Prep- I  Eating- I  Maintaining continence- I  Transferring/Ambulation- I  Managing Meds- I  Follow up appointments reviewed:  PCP Hospital f/u appt confirmed? No  Scheduled to see Dr Jerilee Hoh on 01/27/21 @ 2p. [facility] f/u appt confirmed? Yes  Scheduled to see Dr Marcello Moores on 01/24/21 @ 0900. Are transportation arrangements needed? No  If their condition worsens, is the pt aware to call PCP or go to the Emergency Dept.? Yes Was the patient provided with contact information for the PCP's office or ED? Yes Was to pt encouraged to call back with questions or concerns? Yes

## 2021-01-26 NOTE — Telephone Encounter (Signed)
Note created an error.

## 2021-01-27 ENCOUNTER — Ambulatory Visit (INDEPENDENT_AMBULATORY_CARE_PROVIDER_SITE_OTHER): Payer: 59 | Admitting: Internal Medicine

## 2021-01-27 ENCOUNTER — Encounter: Payer: Self-pay | Admitting: Internal Medicine

## 2021-01-27 VITALS — BP 120/78 | HR 96 | Temp 98.3°F | Ht 64.5 in | Wt 166.1 lb

## 2021-01-27 DIAGNOSIS — Z780 Asymptomatic menopausal state: Secondary | ICD-10-CM | POA: Diagnosis not present

## 2021-01-27 DIAGNOSIS — Z13820 Encounter for screening for osteoporosis: Secondary | ICD-10-CM

## 2021-01-27 DIAGNOSIS — Z0000 Encounter for general adult medical examination without abnormal findings: Secondary | ICD-10-CM

## 2021-01-27 DIAGNOSIS — K219 Gastro-esophageal reflux disease without esophagitis: Secondary | ICD-10-CM | POA: Diagnosis not present

## 2021-01-27 NOTE — Progress Notes (Signed)
Established Patient Office Visit     This visit occurred during the SARS-CoV-2 public health emergency.  Safety protocols were in place, including screening questions prior to the visit, additional usage of staff PPE, and extensive cleaning of exam room while observing appropriate contact time as indicated for disinfecting solutions.    CC/Reason for Visit: Annual preventive exam  HPI: Amber Klein is a 70 y.o. female who is coming in today for the above mentioned reasons. Past Medical History is significant for: Complicated diverticulitis earlier this year that required prolonged hospitalization with colostomy.  During that hospitalization she developed a DVT at her PICC line site, she completed a 40-month course of Eliquis in August.  She had her colostomy reversal a month ago.  She is overdue for eye and dental care.  Immunizations are up-to-date.  She had a colonoscopy in 2022, she is overdue for DEXA scan.  She is overdue for GYN exam and will schedule for beginning of the year.  She is feeling well and has no acute concerns.   Past Medical/Surgical History: Past Medical History:  Diagnosis Date   Arthritis    Cancer ([place])    basal cell skin cancer   Complication of anesthesia    Sensitive to propofol - and BP drops   Dysrhythmia    Ear infection    Environmental and seasonal allergies    Femoral hernia of right side with obstruction 01/18/2018   GERD (gastroesophageal reflux disease)    no longer having issues takes TUMS occ.   History of kidney stones    Hypotension    Irritant contact dermatitis associated with stoma 06/25/2020   Pneumonia 02/2016   walking pneumonia    RBBB    Sinus infection     Past Surgical History:  Procedure Laterality Date   CATARACT EXTRACTION     CESAREAN SECTION  1979   1937   CO2 LASER APPLICATION N/A 90/24/0973   Procedure: CO2 LASER APPLICATION;  Surgeon: Vanessa Kick, MD;  Location: [facility];  Service: Gynecology;   Laterality: N/A;   COLON SURGERY  04/10/2020   with sepsis and colostomy   COLONOSCOPY     COLONOSCOPY WITH PROPOFOL N/A 11/11/2020   Procedure: COLONOSCOPY WITH PROPOFOL;  Surgeon: Milus Banister, MD;  Location: [facility];  Service: Endoscopy;  Laterality: N/A;  NEEDS PREVISIT WITH ANESTHESIA   FOOT SURGERY Right 2009   INGUINAL HERNIA REPAIR Right 01/18/2018   Procedure: OPEN REPAIR INCARCERATED RIGHT FEMORAL HERNIA WITH MESH;  Surgeon: Fanny Skates, MD;  Location: [facility];  Service: General;  Laterality: Right;  GENERAL / TAP BLOCK   KNEE SURGERY Left 2016   ORIF ELBOW FRACTURE Left 07/04/2014   Procedure: OPEN REDUCTION INTERNAL FIXATION (ORIF) ELBOW/OLECRANON FRACTURE;  Surgeon: Meredith Pel, MD;  Location: [facility];  Service: Orthopedics;  Laterality: Left;   STOMA DILATATION N/A 12/08/2020   Procedure: STOMA DILATATION WITH STOMAPLASTY;  Surgeon: Johnathan Hausen, MD;  Location: [facility];  Service: General;  Laterality: N/A;   VULVECTOMY N/A 08/10/2016   Procedure: WIDE EXCISION VULVECTOMY;  Surgeon: Vanessa Kick, MD;  Location: [facility];  Service: Gynecology;  Laterality: N/A;   XI ROBOTIC ASSISTED COLOSTOMY TAKEDOWN N/A 01/05/2021   Procedure: ROBOTIC COLOSTOMY REVERSAL WITH LAPAROSCOPIC LYSIS OF ADHESIONS;  Surgeon: Leighton Ruff, MD;  Location: [facility];  Service: General;  Laterality: N/A;    Social History:  reports that she quit smoking about 6 years ago. Her smoking use included cigarettes.  She has a 4.00 pack-year smoking history. She has never used smokeless tobacco. She reports current alcohol use. She reports that she does not use drugs.  Allergies: Allergies  Allergen Reactions   Iodinated Diagnostic Agents Anaphylaxis   Shellfish Allergy Anaphylaxis   Vancomycin Anaphylaxis   Wasp Venom Anaphylaxis   Ciprofloxacin Hives and Swelling    Hives   Sulfa Antibiotics Swelling and Other (See Comments)    Migraine   Zithromax [Azithromycin] Hives   Latex Rash     Sensitivity on too long   Propofol     Couldn't wake patient up - needs lower doses     Family History:  Family History  Problem Relation Age of Onset   Dementia Mother    Kidney cancer Father    Breast cancer Maternal Grandmother    Colon cancer Neg Hx    Esophageal cancer Neg Hx    Pancreatic cancer Neg Hx    Stomach cancer Neg Hx      Current Outpatient Medications:    albuterol (VENTOLIN HFA) 108 (90 Base) MCG/ACT inhaler, INHALE 2 PUFFS INTO THE LUNGS EVERY 6 HOURS AS NEEDED FOR WHEEZING OR SHORTNESS OF BREATH, Disp: 8.5 g, Rfl: 2   Cholecalciferol (VITAMIN D) 50 MCG (2000 UT) tablet, Take 2,000 Units by mouth daily., Disp: , Rfl:    fexofenadine (ALLEGRA) 180 MG tablet, Take 180 mg by mouth daily., Disp: , Rfl:    latanoprost (XALATAN) 0.005 % ophthalmic solution, Place 1 drop into both eyes daily., Disp: , Rfl:    Multiple Vitamin (MULTIVITAMIN WITH MINERALS) TABS tablet, Take 1 tablet by mouth every other day., Disp: , Rfl:    Probiotic Product ([place]), Take 1 capsule by mouth daily., Disp: , Rfl:   Review of Systems:  Constitutional: Denies fever, chills, diaphoresis, appetite change and fatigue.  HEENT: Denies photophobia, eye pain, redness, hearing loss, ear pain, congestion, sore throat, rhinorrhea, sneezing, mouth sores, trouble swallowing, neck pain, neck stiffness and tinnitus.   Respiratory: Denies SOB, DOE, cough, chest tightness,  and wheezing.   Cardiovascular: Denies chest pain, palpitations and leg swelling.  Gastrointestinal: Denies nausea, vomiting, abdominal pain, diarrhea, constipation, blood in stool and abdominal distention.  Genitourinary: Denies dysuria, urgency, frequency, hematuria, flank pain and difficulty urinating.  Endocrine: Denies: hot or cold intolerance, sweats, changes in hair or nails, polyuria, polydipsia. Musculoskeletal: Denies myalgias, back pain, joint swelling, arthralgias and gait problem.  Skin: Denies pallor,  rash and wound.  Neurological: Denies dizziness, seizures, syncope, weakness, light-headedness, numbness and headaches.  Hematological: Denies adenopathy. Easy bruising, personal or family bleeding history  Psychiatric/Behavioral: Denies suicidal ideation, mood changes, confusion, nervousness, sleep disturbance and agitation    Physical Exam: Vitals:   01/27/21 1355  BP: 120/78  Pulse: 96  Temp: 98.3 F (36.8 C)  TempSrc: Oral  SpO2: 96%  Weight: 166 lb 1.6 oz (75.3 kg)  Height: 5' 4.5" (1.638 m)    Body mass index is 28.07 kg/m.   Constitutional: NAD, calm, comfortable Eyes: PERRL, lids and conjunctivae normal, wears corrective lenses ENMT: Mucous membranes are moist. Posterior pharynx clear of any exudate or lesions. Normal dentition. Tympanic membrane is pearly white, no erythema or bulging. Neck: normal, supple, no masses, no thyromegaly Respiratory: clear to auscultation bilaterally, no wheezing, no crackles. Normal respiratory effort. No accessory muscle use.  Cardiovascular: Regular rate and rhythm, no murmurs / rubs / gallops. No extremity edema. 2+ pedal pulses. No carotid bruits.  Abdomen: no tenderness, no  masses palpated. No hepatosplenomegaly. Bowel sounds positive.  Musculoskeletal: no clubbing / cyanosis. No joint deformity upper and lower extremities. Good ROM, no contractures. Normal muscle tone.  Skin: no rashes, lesions, ulcers. No induration Neurologic: CN 2-12 grossly intact. Sensation intact, DTR normal. Strength 5/5 in all 4.  Psychiatric: Normal judgment and insight. Alert and oriented x 3. Normal mood.    Impression and Plan:  Encounter for preventive health examination  - Plan: CBC with Differential/Platelet, Comprehensive metabolic panel, Hemoglobin A1c, Lipid panel, TSH, Vitamin B12, VITAMIN D 25 Hydroxy (Vit-D Deficiency, Fractures) -Recommend routine eye and dental care. -Immunizations: All immunizations are up-to-date -Healthy lifestyle  discussed in detail. -Labs to be updated today. -Colon cancer screening: 10/2020 -Breast cancer screening: Overdue, she will order through GYN -Cervical cancer screening: Overdue, will see GYN -Lung cancer screening: Not applicable -Prostate cancer screening: Not applicable -DEXA: Ordered today  Gastroesophageal reflux disease without esophagitis -Well-controlled    Patient Instructions  -Nice seeing you today!!  -Schedule follow up in 1 year or sooner as needed.      Lelon Frohlich, MD [facility]

## 2021-01-27 NOTE — Patient Instructions (Signed)
-  Nice seeing you today!!  -Schedule follow up in 1 year or sooner as needed.

## 2021-01-28 ENCOUNTER — Other Ambulatory Visit (INDEPENDENT_AMBULATORY_CARE_PROVIDER_SITE_OTHER): Payer: 59

## 2021-01-28 DIAGNOSIS — Z0000 Encounter for general adult medical examination without abnormal findings: Secondary | ICD-10-CM | POA: Diagnosis not present

## 2021-01-28 LAB — VITAMIN D 25 HYDROXY (VIT D DEFICIENCY, FRACTURES): VITD: 37.88 ng/mL (ref 30.00–100.00)

## 2021-01-28 LAB — COMPREHENSIVE METABOLIC PANEL
ALT: 20 U/L (ref 0–35)
AST: 19 U/L (ref 0–37)
Albumin: 3.9 g/dL (ref 3.5–5.2)
Alkaline Phosphatase: 104 U/L (ref 39–117)
BUN: 13 mg/dL (ref 6–23)
CO2: 30 mEq/L (ref 19–32)
Calcium: 9.9 mg/dL (ref 8.4–10.5)
Chloride: 102 mEq/L (ref 96–112)
Creatinine, Ser: 0.76 mg/dL (ref 0.40–1.20)
GFR: 80.49 mL/min (ref >60.00)
Glucose, Bld: 95 mg/dL (ref 70–99)
Potassium: 4 mEq/L (ref 3.5–5.1)
Sodium: 139 mEq/L (ref 135–145)
Total Bilirubin: 0.5 mg/dL (ref 0.2–1.2)
Total Protein: 6.9 g/dL (ref 6.0–8.3)

## 2021-01-28 LAB — HEMOGLOBIN A1C: Hgb A1c MFr Bld: 6.2 % (ref 4.6–6.5)

## 2021-01-28 LAB — TSH: TSH: 1.88 u[IU]/mL (ref 0.35–5.50)

## 2021-01-28 LAB — LIPID PANEL
Cholesterol: 253 mg/dL — ABNORMAL HIGH (ref 0–200)
HDL: 91.2 mg/dL (ref >39.00)
LDL Cholesterol: 150 mg/dL — ABNORMAL HIGH (ref 0–99)
NonHDL: 162.21
Total CHOL/HDL Ratio: 3
Triglycerides: 63 mg/dL (ref 0.0–149.0)
VLDL: 12.6 mg/dL (ref 0.0–40.0)

## 2021-01-28 LAB — CBC WITH DIFFERENTIAL/PLATELET
Basophils Absolute: 0.1 10*3/uL (ref 0.0–0.1)
Basophils Relative: 1 % (ref 0.0–3.0)
Eosinophils Absolute: 0.5 10*3/uL (ref 0.0–0.7)
Eosinophils Relative: 8.3 % — ABNORMAL HIGH (ref 0.0–5.0)
HCT: 39.7 % (ref 36.0–46.0)
Hemoglobin: 12.9 g/dL (ref 12.0–15.0)
Lymphocytes Relative: 29.9 % (ref 12.0–46.0)
Lymphs Abs: 1.9 10*3/uL (ref 0.7–4.0)
MCHC: 32.4 g/dL (ref 30.0–36.0)
MCV: 88.6 fl (ref 78.0–100.0)
Monocytes Absolute: 0.5 10*3/uL (ref 0.1–1.0)
Monocytes Relative: 7.8 % (ref 3.0–12.0)
Neutro Abs: 3.4 10*3/uL (ref 1.4–7.7)
Neutrophils Relative %: 53 % (ref 43.0–77.0)
Platelets: 430 10*3/uL — ABNORMAL HIGH (ref 150.0–400.0)
RBC: 4.48 Mil/uL (ref 3.87–5.11)
RDW: 14.9 % (ref 11.5–15.5)
WBC: 6.4 10*3/uL (ref 4.0–10.5)

## 2021-01-28 LAB — VITAMIN B12: Vitamin B-12: 295 pg/mL (ref 211–911)

## 2021-02-03 ENCOUNTER — Other Ambulatory Visit: Payer: Self-pay | Admitting: Internal Medicine

## 2021-02-03 DIAGNOSIS — E785 Hyperlipidemia, unspecified: Secondary | ICD-10-CM

## 2021-02-23 ENCOUNTER — Encounter: Payer: Self-pay | Admitting: General Surgery

## 2021-03-16 ENCOUNTER — Ambulatory Visit: Payer: Self-pay | Admitting: General Surgery

## 2021-03-16 NOTE — H&P (View-Only) (Signed)
History of Present Illness: Amber Klein is a 71 y.o. female who is seen today as an office consultation at the request of Dr. Marcello Moores for evaluation of New problem .   Patient is a 71 year old female who comes in with a history of perforated diverticulitis, Hartman's resection, colostomy reversal.  Colostomy reversal was with Dr. Marcello Moores on 01/05/2021.  Since that time patient was noted to have a hernia in the midline incision.  This was done at outside hospital. Patient states that after off reversal she been doing well with a second hernia which she feels got larger.  She physicians gained some weight, approximate 20 pounds.   Patient states that when she lays down the hernia reduces.  She has had no significant pain to the area but does feel that the hernias get larger.       Review of Systems: A complete review of systems was obtained from the patient.  I have reviewed this information and discussed as appropriate with the patient.  See HPI as well for other ROS.   Review of Systems  Constitutional: Negative for fever.  HENT: Negative for congestion.   Eyes: Negative for blurred vision.  Respiratory: Negative for cough, shortness of breath and wheezing.   Cardiovascular: Negative for chest pain and palpitations.  Gastrointestinal: Negative for heartburn.  Genitourinary: Negative for dysuria.  Musculoskeletal: Negative for myalgias.  Skin: Negative for rash.  Neurological: Negative for dizziness and headaches.  Psychiatric/Behavioral: Negative for depression and suicidal ideas.  All other systems reviewed and are negative.       Medical History: Past Medical History Past Medical History: Diagnosis Date  Asthma, unspecified asthma severity, unspecified whether complicated, unspecified whether persistent    DVT (deep venous thrombosis) (CMS-HCC)    Glaucoma (increased eye pressure)        There is no problem list on file for this patient.     Past Surgical History Past  Surgical History: Procedure Laterality Date  fott surgery   2009  RECONSTRUCTION LATERAL COLLATERAL LIGAMENT ELBOW W/TENDON GRAFT   06/2014  COLOSTOMY   04/10/2020  colostomy reversal   01/05/2021  CESAREAN SECTION          Allergies Allergies Allergen Reactions  Iodine Anaphylaxis  Propofol Anaphylaxis     Couldn't wake patient up - needs lower doses     Shellfish Derived Anaphylaxis  Vancomycin Anaphylaxis  Wasp Venom Anaphylaxis  Azithromycin Hives  Ciprofloxacin Hives and Swelling     Hives    Iodinated Contrast Media Unknown  Sulfa (Sulfonamide Antibiotics) Other (See Comments)  Latex Rash     Sensitivity on too long Sensitivity on too long        Current Outpatient Medications on File Prior to Visit Medication Sig Dispense Refill  bisacodyL (DULCOLAX) 5 mg EC tablet Take 4 tablets (20 mg total) by mouth once daily as needed for Constipation for up to 1 dose 4 tablet 0  latanoprost (XALATAN) 0.005 % ophthalmic solution Apply 1 drop to eye once daily       No current facility-administered medications on file prior to visit.     Family History Family History Problem Relation Age of Onset  High blood pressure (Hypertension) Mother    Hyperlipidemia (Elevated cholesterol) Mother        Social History   Tobacco Use Smoking Status Former  Types: Cigarettes Smokeless Tobacco Never     Social History Social History    Socioeconomic History  Marital status: Married Tobacco Use  Smoking status: Former     Types: Cigarettes  Smokeless tobacco: Never Substance and Sexual Activity  Alcohol use: Yes  Drug use: Never      Objective:     Vitals:   03/16/21 1121 BP: 120/80 Pulse: 101 Temp: 37 C (98.6 F) SpO2: 95% Weight: 76.7 kg (169 lb) Height: 163.8 cm (5' 4.5")   Body mass index is 28.56 kg/m.   Physical Exam Constitutional:      Appearance: Normal appearance.  HENT:     Head: Normocephalic and atraumatic.     Mouth/Throat:      Mouth: Mucous membranes are moist.     Pharynx: Oropharynx is clear.  Eyes:     General: No scleral icterus.    Pupils: Pupils are equal, round, and reactive to light.  Cardiovascular:     Rate and Rhythm: Normal rate and regular rhythm.     Pulses: Normal pulses.     Heart sounds: No murmur heard.   No friction rub. No gallop.  Pulmonary:     Effort: Pulmonary effort is normal. No respiratory distress.     Breath sounds: Normal breath sounds. No stridor.  Abdominal:     General: Abdomen is flat.     Hernia: A hernia is present.    Musculoskeletal:        General: No swelling.  Skin:    General: Skin is warm.  Neurological:     General: No focal deficit present.     Mental Status: She is alert and oriented to person, place, and time. Mental status is at baseline.  Psychiatric:        Mood and Affect: Mood normal.        Thought Content: Thought content normal.        Judgment: Judgment normal.        Hernia Size:16cm Incarcerated: no Initial Hernia     Assessment and Plan: Diagnoses and all orders for this visit:   Incisional hernia without obstruction or gangrene     Amber Klein is a 72 y.o. female    1. I long discussion with patient regards her hernia.  She has a fairly significant incisional hernia midline incision.  I do not feel a hernia at the previous ostomy site. 2. Patient had previous imaging in October prior to her Hartman's reversal.  I did review this with her personally. 3. We will proceed to the operating for an open incisional hernia repair with mesh, lysis of adhesions. 4. All risks and benefits were discussed with the patient, to generally include infection, bleeding, damage to surrounding structures, acute and chronic nerve pain, and recurrence. Alternatives were offered and described.  All questions were answered and the patient voiced understanding of the procedure and wishes to proceed at this point.             No follow-ups on  file.   Ralene Ok, MD, [facility], [facility]

## 2021-03-16 NOTE — H&P (Signed)
History of Present Illness: Amber Klein is a 71 y.o. female who is seen today as an office consultation at the request of Dr. Marcello Moores for evaluation of New problem .   Patient is a 71 year old female who comes in with a history of perforated diverticulitis, Hartman's resection, colostomy reversal.  Colostomy reversal was with Dr. Marcello Moores on 01/05/2021.  Since that time patient was noted to have a hernia in the midline incision.  This was done at outside hospital. Patient states that after off reversal she been doing well with a second hernia which she feels got larger.  She physicians gained some weight, approximate 20 pounds.   Patient states that when she lays down the hernia reduces.  She has had no significant pain to the area but does feel that the hernias get larger.       Review of Systems: A complete review of systems was obtained from the patient.  I have reviewed this information and discussed as appropriate with the patient.  See HPI as well for other ROS.   Review of Systems  Constitutional: Negative for fever.  HENT: Negative for congestion.   Eyes: Negative for blurred vision.  Respiratory: Negative for cough, shortness of breath and wheezing.   Cardiovascular: Negative for chest pain and palpitations.  Gastrointestinal: Negative for heartburn.  Genitourinary: Negative for dysuria.  Musculoskeletal: Negative for myalgias.  Skin: Negative for rash.  Neurological: Negative for dizziness and headaches.  Psychiatric/Behavioral: Negative for depression and suicidal ideas.  All other systems reviewed and are negative.       Medical History: Past Medical History Past Medical History: Diagnosis Date  Asthma, unspecified asthma severity, unspecified whether complicated, unspecified whether persistent    DVT (deep venous thrombosis) (CMS-HCC)    Glaucoma (increased eye pressure)        There is no problem list on file for this patient.     Past Surgical History Past  Surgical History: Procedure Laterality Date  fott surgery   2009  RECONSTRUCTION LATERAL COLLATERAL LIGAMENT ELBOW W/TENDON GRAFT   06/2014  COLOSTOMY   04/10/2020  colostomy reversal   01/05/2021  CESAREAN SECTION          Allergies Allergies Allergen Reactions  Iodine Anaphylaxis  Propofol Anaphylaxis     Couldn't wake patient up - needs lower doses     Shellfish Derived Anaphylaxis  Vancomycin Anaphylaxis  Wasp Venom Anaphylaxis  Azithromycin Hives  Ciprofloxacin Hives and Swelling     Hives    Iodinated Contrast Media Unknown  Sulfa (Sulfonamide Antibiotics) Other (See Comments)  Latex Rash     Sensitivity on too long Sensitivity on too long        Current Outpatient Medications on File Prior to Visit Medication Sig Dispense Refill  bisacodyL (DULCOLAX) 5 mg EC tablet Take 4 tablets (20 mg total) by mouth once daily as needed for Constipation for up to 1 dose 4 tablet 0  latanoprost (XALATAN) 0.005 % ophthalmic solution Apply 1 drop to eye once daily       No current facility-administered medications on file prior to visit.     Family History Family History Problem Relation Age of Onset  High blood pressure (Hypertension) Mother    Hyperlipidemia (Elevated cholesterol) Mother        Social History   Tobacco Use Smoking Status Former  Types: Cigarettes Smokeless Tobacco Never     Social History Social History    Socioeconomic History  Marital status: Married Tobacco Use  Smoking status: Former     Types: Cigarettes  Smokeless tobacco: Never Substance and Sexual Activity  Alcohol use: Yes  Drug use: Never      Objective:     Vitals:   03/16/21 1121 BP: 120/80 Pulse: 101 Temp: 37 C (98.6 F) SpO2: 95% Weight: 76.7 kg (169 lb) Height: 163.8 cm (5' 4.5")   Body mass index is 28.56 kg/m.   Physical Exam Constitutional:      Appearance: Normal appearance.  HENT:     Head: Normocephalic and atraumatic.     Mouth/Throat:      Mouth: Mucous membranes are moist.     Pharynx: Oropharynx is clear.  Eyes:     General: No scleral icterus.    Pupils: Pupils are equal, round, and reactive to light.  Cardiovascular:     Rate and Rhythm: Normal rate and regular rhythm.     Pulses: Normal pulses.     Heart sounds: No murmur heard.   No friction rub. No gallop.  Pulmonary:     Effort: Pulmonary effort is normal. No respiratory distress.     Breath sounds: Normal breath sounds. No stridor.  Abdominal:     General: Abdomen is flat.     Hernia: A hernia is present.    Musculoskeletal:        General: No swelling.  Skin:    General: Skin is warm.  Neurological:     General: No focal deficit present.     Mental Status: She is alert and oriented to person, place, and time. Mental status is at baseline.  Psychiatric:        Mood and Affect: Mood normal.        Thought Content: Thought content normal.        Judgment: Judgment normal.        Hernia Size:16cm Incarcerated: no Initial Hernia     Assessment and Plan: Diagnoses and all orders for this visit:   Incisional hernia without obstruction or gangrene     Amber Klein is a 71 y.o. female    1. I long discussion with patient regards her hernia.  She has a fairly significant incisional hernia midline incision.  I do not feel a hernia at the previous ostomy site. 2. Patient had previous imaging in October prior to her Hartman's reversal.  I did review this with her personally. 3. We will proceed to the operating for an open incisional hernia repair with mesh, lysis of adhesions. 4. All risks and benefits were discussed with the patient, to generally include infection, bleeding, damage to surrounding structures, acute and chronic nerve pain, and recurrence. Alternatives were offered and described.  All questions were answered and the patient voiced understanding of the procedure and wishes to proceed at this point.             No follow-ups on  file.   Ralene Ok, MD, [facility], [facility]

## 2021-03-17 ENCOUNTER — Other Ambulatory Visit: Payer: Self-pay | Admitting: *Deleted

## 2021-03-17 MED ORDER — EPINEPHRINE 0.3 MG/0.3ML IJ SOSY: PREFILLED_SYRINGE | INTRAMUSCULAR | 2 refills | Status: DC

## 2021-03-28 NOTE — Progress Notes (Signed)
Surgical Instructions    Your procedure is scheduled on 04/06/21.  Report to [facility] Main Entrance "A" at 8:30 A.M., then check in with the Admitting office.  Call this number if you have problems the morning of surgery:  [phone]   If you have any questions prior to your surgery date call [phone]: Open Monday-Friday 8am-4pm    Remember:  Do not eat after midnight the night before your surgery  You may drink clear liquids until 7:30 the morning of your surgery.   Clear liquids allowed are: Water, Non-Citrus Juices (without pulp), Carbonated Beverages, Clear Tea, Black Coffee ONLY (NO MILK, CREAM OR POWDERED CREAMER of any kind), and Gatorade  Please complete your PRE-SURGERY ENSURE that was provided to you by 7:30 the morning of surgery.  Please, if able, drink it in one setting. DO NOT SIP.     Take these medicines the morning of surgery with A SIP OF WATER:  fexofenadine-pseudoephedrine (ALLEGRA-D)  AS NEEDED: albuterol (VENTOLIN HFA) EPINEPHrine   As of today, STOP taking any Aspirin (unless otherwise instructed by your surgeon) Aleve, Naproxen, Ibuprofen, Motrin, Advil, Goody's, BC's, all herbal medications, fish oil, and all vitamins.           Do not wear jewelry or makeup Do not wear lotions, powders, perfumes or deodorant. Do not shave 48 hours prior to surgery.   Do not bring valuables to the hospital. Do not wear nail polish, gel polish, artificial nails, or any other type of covering on natural nails (fingers and toes) If you have artificial nails or gel coating that need to be removed by a nail salon, please have this removed prior to surgery. Artificial nails or gel coating may interfere with anesthesia's ability to adequately monitor your vital signs.  [facility] is not responsible for any belongings or valuables. .   Do NOT Smoke (Tobacco/Vaping)  24 hours prior to your procedure  If you use a CPAP at night, you may bring your mask for your  overnight stay.   Contacts, glasses, hearing aids, dentures or partials may not be worn into surgery, please bring cases for these belongings   For patients admitted to the hospital, discharge time will be determined by your treatment team.   Patients discharged the day of surgery will not be allowed to drive home, and someone needs to stay with them for 24 hours.  NO VISITORS WILL BE ALLOWED IN PRE-OP WHERE PATIENTS ARE PREPPED FOR SURGERY.  ONLY 1 SUPPORT PERSON MAY BE PRESENT IN THE WAITING ROOM WHILE YOU ARE IN SURGERY.  IF YOU ARE TO BE ADMITTED, ONCE YOU ARE IN YOUR ROOM YOU WILL BE ALLOWED TWO (2) VISITORS. 1 (ONE) VISITOR MAY STAY OVERNIGHT BUT MUST ARRIVE TO THE ROOM BY 8pm.  Minor children may have two parents present. Special consideration for safety and communication needs will be reviewed on a case by case basis.  Special instructions:    Oral Hygiene is also important to reduce your risk of infection.  Remember - BRUSH YOUR TEETH THE MORNING OF SURGERY WITH YOUR REGULAR TOOTHPASTE   [facility]- Preparing For Surgery  Before surgery, you can play an important role. Because skin is not sterile, your skin needs to be as free of germs as possible. You can reduce the number of germs on your skin by washing with CHG (chlorahexidine gluconate) Soap before surgery.  CHG is an antiseptic cleaner which kills germs and bonds with the skin to continue killing germs even  after washing.     Please do not use if you have an allergy to CHG or antibacterial soaps. If your skin becomes reddened/irritated stop using the CHG.  Do not shave (including legs and underarms) for at least 48 hours prior to first CHG shower. It is OK to shave your face.  Please follow these instructions carefully.     Shower the NIGHT BEFORE SURGERY and the MORNING OF SURGERY with CHG Soap.   If you chose to wash your hair, wash your hair first as usual with your normal shampoo. After you shampoo, rinse your hair and  body thoroughly to remove the shampoo.  Then [employer] and genitals (private parts) with your normal soap and rinse thoroughly to remove soap.  After that Use CHG Soap as you would any other liquid soap. You can apply CHG directly to the skin and wash gently with a scrungie or a clean washcloth.   Apply the CHG Soap to your body ONLY FROM THE NECK DOWN.  Do not use on open wounds or open sores. Avoid contact with your eyes, ears, mouth and genitals (private parts). Wash Face and genitals (private parts)  with your normal soap.   Wash thoroughly, paying special attention to the area where your surgery will be performed.  Thoroughly rinse your body with warm water from the neck down.  DO NOT shower/wash with your normal soap after using and rinsing off the CHG Soap.  Pat yourself dry with a CLEAN TOWEL.  Wear CLEAN PAJAMAS to bed the night before surgery  Place CLEAN SHEETS on your bed the night before your surgery  DO NOT SLEEP WITH PETS.   Day of Surgery:  Take a shower with CHG soap. Wear Clean/Comfortable clothing the morning of surgery Do not apply any deodorants/lotions.   Remember to brush your teeth WITH YOUR REGULAR TOOTHPASTE.    COVID testing  If you are going to stay overnight or be admitted after your procedure/surgery and require a pre-op COVID test, please follow these instructions after your COVID test   You are not required to quarantine however you are required to wear a well-fitting mask when you are out and around people not in your household.  If your mask becomes wet or soiled, replace with a new one.  Wash your hands often with soap and water for 20 seconds or clean your hands with an alcohol-based hand sanitizer that contains at least 60% alcohol.  Do not share personal items.  Notify your provider: if you are in close contact with someone who has COVID  or if you develop a fever of 100.4 or greater, sneezing, cough, sore throat, shortness of breath or  body aches.    Please read over the following fact sheets that you were given.

## 2021-03-29 ENCOUNTER — Other Ambulatory Visit: Payer: Self-pay

## 2021-03-29 ENCOUNTER — Encounter (HOSPITAL_COMMUNITY)
Admission: RE | Admit: 2021-03-29 | Discharge: 2021-03-29 | Disposition: A | Discharge status: Home / Self Care | Payer: 59 | Source: Ambulatory Visit | Attending: General Surgery | Admitting: General Surgery

## 2021-03-29 ENCOUNTER — Encounter (HOSPITAL_COMMUNITY): Payer: Self-pay

## 2021-03-29 VITALS — BP 136/88 | HR 84 | Temp 98.5°F | Resp 17 | Ht 64.0 in | Wt 166.1 lb

## 2021-03-29 DIAGNOSIS — Z01812 Encounter for preprocedural laboratory examination: Secondary | ICD-10-CM | POA: Diagnosis present

## 2021-03-29 DIAGNOSIS — Z01818 Encounter for other preprocedural examination: Secondary | ICD-10-CM

## 2021-03-29 HISTORY — DX: Unspecified asthma, uncomplicated: J45.909

## 2021-03-29 LAB — CBC
HCT: 42.5 % (ref 36.0–46.0)
Hemoglobin: 13.6 g/dL (ref 12.0–15.0)
MCH: 29.2 pg (ref 26.0–34.0)
MCHC: 32 g/dL (ref 30.0–36.0)
MCV: 91.4 fL (ref 80.0–100.0)
Platelets: 384 10*3/uL (ref 150–400)
RBC: 4.65 MIL/uL (ref 3.87–5.11)
RDW: 13.8 % (ref 11.5–15.5)
WBC: 5.8 10*3/uL (ref 4.0–10.5)
nRBC: 0 % (ref 0.0–0.2)

## 2021-03-29 NOTE — Progress Notes (Signed)
Surgical Instructions    Your procedure is scheduled on 04/06/21.  Report to [facility] Main Entrance "A" at 8:30 A.M., then check in with the Admitting office.  Call this number if you have problems the morning of surgery:  [phone]   If you have any questions prior to your surgery date call [phone]: Open Monday-Friday 8am-4pm    Remember:  Do not eat after midnight the night before your surgery  You may drink clear liquids until 7:30 the morning of your surgery.   Clear liquids allowed are: Water, Non-Citrus Juices (without pulp), Carbonated Beverages, Clear Tea, Black Coffee ONLY (NO MILK, CREAM OR POWDERED CREAMER of any kind), and Gatorade  Please complete your PRE-SURGERY ENSURE that was provided to you by 7:30 the morning of surgery.  Please, if able, drink it in one setting. DO NOT SIP.     Take these medicines the morning of surgery with A SIP OF WATER:  NONE  AS NEEDED: albuterol (VENTOLIN HFA) EPINEPHrine   As of today, STOP taking any Aspirin (unless otherwise instructed by your surgeon) Aleve, Naproxen, Ibuprofen, Motrin, Advil, Goody's, BC's, all herbal medications, fish oil, and all vitamins.           Do not wear jewelry or makeup Do not wear lotions, powders, perfumes or deodorant. Do not shave 48 hours prior to surgery.   Do not bring valuables to the hospital. Do not wear nail polish, gel polish, artificial nails, or any other type of covering on natural nails (fingers and toes) If you have artificial nails or gel coating that need to be removed by a nail salon, please have this removed prior to surgery. Artificial nails or gel coating may interfere with anesthesia's ability to adequately monitor your vital signs.  [facility] is not responsible for any belongings or valuables. .   Do NOT Smoke (Tobacco/Vaping)  24 hours prior to your procedure  If you use a CPAP at night, you may bring your mask for your overnight stay.   Contacts, glasses,  hearing aids, dentures or partials may not be worn into surgery, please bring cases for these belongings   For patients admitted to the hospital, discharge time will be determined by your treatment team.   Patients discharged the day of surgery will not be allowed to drive home, and someone needs to stay with them for 24 hours.  NO VISITORS WILL BE ALLOWED IN PRE-OP WHERE PATIENTS ARE PREPPED FOR SURGERY.  ONLY 1 SUPPORT PERSON MAY BE PRESENT IN THE WAITING ROOM WHILE YOU ARE IN SURGERY.  IF YOU ARE TO BE ADMITTED, ONCE YOU ARE IN YOUR ROOM YOU WILL BE ALLOWED TWO (2) VISITORS. 1 (ONE) VISITOR MAY STAY OVERNIGHT BUT MUST ARRIVE TO THE ROOM BY 8pm.  Minor children may have two parents present. Special consideration for safety and communication needs will be reviewed on a case by case basis.  Special instructions:    Oral Hygiene is also important to reduce your risk of infection.  Remember - BRUSH YOUR TEETH THE MORNING OF SURGERY WITH YOUR REGULAR TOOTHPASTE   - Preparing For Surgery  Before surgery, you can play an important role. Because skin is not sterile, your skin needs to be as free of germs as possible. You can reduce the number of germs on your skin by washing with CHG (chlorahexidine gluconate) Soap before surgery.  CHG is an antiseptic cleaner which kills germs and bonds with the skin to continue killing germs even after  washing.     Please do not use if you have an allergy to CHG or antibacterial soaps. If your skin becomes reddened/irritated stop using the CHG.  Do not shave (including legs and underarms) for at least 48 hours prior to first CHG shower. It is OK to shave your face.  Please follow these instructions carefully.     Shower the NIGHT BEFORE SURGERY and the MORNING OF SURGERY with CHG Soap.   If you chose to wash your hair, wash your hair first as usual with your normal shampoo. After you shampoo, rinse your hair and body thoroughly to remove the shampoo.   Then [employer] and genitals (private parts) with your normal soap and rinse thoroughly to remove soap.  After that Use CHG Soap as you would any other liquid soap. You can apply CHG directly to the skin and wash gently with a scrungie or a clean washcloth.   Apply the CHG Soap to your body ONLY FROM THE NECK DOWN.  Do not use on open wounds or open sores. Avoid contact with your eyes, ears, mouth and genitals (private parts). Wash Face and genitals (private parts)  with your normal soap.   Wash thoroughly, paying special attention to the area where your surgery will be performed.  Thoroughly rinse your body with warm water from the neck down.  DO NOT shower/wash with your normal soap after using and rinsing off the CHG Soap.  Pat yourself dry with a CLEAN TOWEL.  Wear CLEAN PAJAMAS to bed the night before surgery  Place CLEAN SHEETS on your bed the night before your surgery  DO NOT SLEEP WITH PETS.   Day of Surgery:  Take a shower with CHG soap. Wear Clean/Comfortable clothing the morning of surgery Do not apply any deodorants/lotions.   Remember to brush your teeth WITH YOUR REGULAR TOOTHPASTE.    COVID testing  If you are going to stay overnight or be admitted after your procedure/surgery and require a pre-op COVID test, please follow these instructions after your COVID test   You are not required to quarantine however you are required to wear a well-fitting mask when you are out and around people not in your household.  If your mask becomes wet or soiled, replace with a new one.  Wash your hands often with soap and water for 20 seconds or clean your hands with an alcohol-based hand sanitizer that contains at least 60% alcohol.  Do not share personal items.  Notify your provider: if you are in close contact with someone who has COVID  or if you develop a fever of 100.4 or greater, sneezing, cough, sore throat, shortness of breath or body aches.    Please read over the  following fact sheets that you were given.

## 2021-03-29 NOTE — Progress Notes (Signed)
PCP - Dr. Jerilee Hoh Cardiologist - patient denies  PPM/ICD - n/a Device Orders -  Rep Notified -   Chest x-ray - 04/26/20 CE EKG - 12/08/20 Stress Test - patient denies ECHO - patient denies Cardiac Cath - patient denies  Sleep Study - patient denies CPAP -   Fasting Blood Sugar - n/a Checks Blood Sugar _____ times a day  Blood Thinner Instructions: n/a Aspirin Instructions: n/a  ERAS Protcol - clears until 0730 PRE-SURGERY Ensure or G2- given at PAT 2 to be completed the night before surgery and 1 to be completed by 0630 day of surgery  COVID TEST- n/a   Anesthesia review: yes, abnormal EKG  Patient denies shortness of breath, fever, cough and chest pain at PAT appointment   All instructions explained to the patient, with a verbal understanding of the material. Patient agrees to go over the instructions while at home for a better understanding. Patient also instructed to self quarantine after being tested for COVID-19. The opportunity to ask questions was provided.

## 2021-04-04 ENCOUNTER — Other Ambulatory Visit (HOSPITAL_COMMUNITY)
Admission: RE | Admit: 2021-04-04 | Discharge: 2021-04-04 | Disposition: A | Discharge status: Home / Self Care | Payer: 59 | Source: Ambulatory Visit | Attending: General Surgery | Admitting: General Surgery

## 2021-04-04 LAB — SARS CORONAVIRUS 2 (TAT 6-24 HRS): SARS Coronavirus 2: NEGATIVE

## 2021-04-06 ENCOUNTER — Ambulatory Visit (HOSPITAL_COMMUNITY): Payer: 59 | Admitting: Physician Assistant

## 2021-04-06 ENCOUNTER — Encounter (HOSPITAL_COMMUNITY)
Admission: AD | Disposition: A | Discharge status: Home / Self Care | Payer: Self-pay | Source: Home / Self Care | Attending: General Surgery

## 2021-04-06 ENCOUNTER — Other Ambulatory Visit: Payer: Self-pay

## 2021-04-06 ENCOUNTER — Ambulatory Visit (HOSPITAL_COMMUNITY): Payer: 59 | Admitting: Certified Registered"

## 2021-04-06 ENCOUNTER — Encounter (HOSPITAL_COMMUNITY): Payer: Self-pay | Admitting: General Surgery

## 2021-04-06 ENCOUNTER — Inpatient Hospital Stay (HOSPITAL_COMMUNITY)
Admission: AD | Admit: 2021-04-06 | Discharge: 2021-04-10 | DRG: 355 | Disposition: A | Discharge status: Home / Self Care | Payer: 59 | Attending: General Surgery | Admitting: General Surgery

## 2021-04-06 DIAGNOSIS — Z91040 Latex allergy status: Secondary | ICD-10-CM | POA: Diagnosis not present

## 2021-04-06 DIAGNOSIS — K660 Peritoneal adhesions (postprocedural) (postinfection): Secondary | ICD-10-CM

## 2021-04-06 DIAGNOSIS — Z20822 Contact with and (suspected) exposure to covid-19: Secondary | ICD-10-CM | POA: Diagnosis present

## 2021-04-06 DIAGNOSIS — Z881 Allergy status to other antibiotic agents status: Secondary | ICD-10-CM | POA: Diagnosis not present

## 2021-04-06 DIAGNOSIS — Z8719 Personal history of other diseases of the digestive system: Secondary | ICD-10-CM

## 2021-04-06 DIAGNOSIS — Z86718 Personal history of other venous thrombosis and embolism: Secondary | ICD-10-CM | POA: Diagnosis not present

## 2021-04-06 DIAGNOSIS — J45909 Unspecified asthma, uncomplicated: Secondary | ICD-10-CM | POA: Diagnosis present

## 2021-04-06 DIAGNOSIS — J189 Pneumonia, unspecified organism: Secondary | ICD-10-CM | POA: Diagnosis not present

## 2021-04-06 DIAGNOSIS — K432 Incisional hernia without obstruction or gangrene: Principal | ICD-10-CM | POA: Diagnosis present

## 2021-04-06 DIAGNOSIS — K5900 Constipation, unspecified: Secondary | ICD-10-CM | POA: Diagnosis present

## 2021-04-06 DIAGNOSIS — Z79899 Other long term (current) drug therapy: Secondary | ICD-10-CM

## 2021-04-06 DIAGNOSIS — Z91030 Bee allergy status: Secondary | ICD-10-CM | POA: Diagnosis not present

## 2021-04-06 DIAGNOSIS — K219 Gastro-esophageal reflux disease without esophagitis: Secondary | ICD-10-CM

## 2021-04-06 DIAGNOSIS — R110 Nausea: Secondary | ICD-10-CM | POA: Diagnosis present

## 2021-04-06 DIAGNOSIS — Z01818 Encounter for other preprocedural examination: Secondary | ICD-10-CM

## 2021-04-06 DIAGNOSIS — Z884 Allergy status to anesthetic agent status: Secondary | ICD-10-CM

## 2021-04-06 DIAGNOSIS — Z882 Allergy status to sulfonamides status: Secondary | ICD-10-CM

## 2021-04-06 DIAGNOSIS — H409 Unspecified glaucoma: Secondary | ICD-10-CM | POA: Diagnosis present

## 2021-04-06 DIAGNOSIS — Z87891 Personal history of nicotine dependence: Secondary | ICD-10-CM

## 2021-04-06 DIAGNOSIS — Z91013 Allergy to seafood: Secondary | ICD-10-CM

## 2021-04-06 HISTORY — PX: LYSIS OF ADHESION: SHX5961

## 2021-04-06 HISTORY — PX: LAPAROTOMY: SHX154

## 2021-04-06 HISTORY — PX: INCISIONAL HERNIA REPAIR: SHX193

## 2021-04-06 HISTORY — PX: INSERTION OF MESH: SHX5868

## 2021-04-06 SURGERY — LAPAROTOMY, EXPLORATORY
Anesthesia: General | Site: Abdomen

## 2021-04-06 MED ORDER — LATANOPROST 0.005 % OP SOLN
1.0000 [drp] | Freq: Every day | OPHTHALMIC | Status: DC
  Administered 2021-04-06 – 2021-04-09 (×4): 1 [drp] via OPHTHALMIC
  Filled 2021-04-06: qty 2.5

## 2021-04-06 MED ORDER — TRAMADOL HCL 50 MG PO TABS
50.0000 mg | ORAL_TABLET | Freq: Four times a day (QID) | ORAL | Status: DC | PRN
  Administered 2021-04-06 – 2021-04-09 (×8): 50 mg via ORAL
  Filled 2021-04-06 (×8): qty 1

## 2021-04-06 MED ORDER — ONDANSETRON HCL 4 MG/2ML IJ SOLN
INTRAMUSCULAR | Status: AC
  Filled 2021-04-06: qty 2

## 2021-04-06 MED ORDER — ORAL CARE MOUTH RINSE: 15.0000 mL | Freq: Once | OROMUCOSAL | Status: AC

## 2021-04-06 MED ORDER — VISTASEAL 10 ML SINGLE DOSE KIT
PACK | CUTANEOUS | Status: DC | PRN
  Administered 2021-04-06: 10 mL via TOPICAL

## 2021-04-06 MED ORDER — BUPIVACAINE LIPOSOME 1.3 % IJ SUSP
INTRAMUSCULAR | Status: AC
  Filled 2021-04-06: qty 20

## 2021-04-06 MED ORDER — 0.9 % SODIUM CHLORIDE (POUR BTL) OPTIME
TOPICAL | Status: DC | PRN
  Administered 2021-04-06 (×2): 1000 mL

## 2021-04-06 MED ORDER — ENSURE PRE-SURGERY PO LIQD: 296.0000 mL | Freq: Once | ORAL | Status: DC

## 2021-04-06 MED ORDER — ENSURE PRE-SURGERY PO LIQD: 592.0000 mL | Freq: Once | ORAL | Status: DC

## 2021-04-06 MED ORDER — CEFAZOLIN SODIUM-DEXTROSE 2-4 GM/100ML-% IV SOLN
2.0000 g | INTRAVENOUS | Status: AC
  Administered 2021-04-06: 2 g via INTRAVENOUS
  Filled 2021-04-06: qty 100

## 2021-04-06 MED ORDER — DEXAMETHASONE SODIUM PHOSPHATE 10 MG/ML IJ SOLN
INTRAMUSCULAR | Status: DC | PRN
  Administered 2021-04-06: 10 mg via INTRAVENOUS

## 2021-04-06 MED ORDER — ONDANSETRON HCL 4 MG/2ML IJ SOLN
INTRAMUSCULAR | Status: DC | PRN
  Administered 2021-04-06: 4 mg via INTRAVENOUS

## 2021-04-06 MED ORDER — PHENYLEPHRINE 40 MCG/ML (10ML) SYRINGE FOR IV PUSH (FOR BLOOD PRESSURE SUPPORT)
PREFILLED_SYRINGE | INTRAVENOUS | Status: AC
  Filled 2021-04-06: qty 10

## 2021-04-06 MED ORDER — SODIUM CHLORIDE 0.9 % IV SOLN
INTRAVENOUS | Status: DC | PRN
  Administered 2021-04-06: 40 mL

## 2021-04-06 MED ORDER — HYDROMORPHONE HCL 1 MG/ML IJ SOLN
0.2500 mg | INTRAMUSCULAR | Status: DC | PRN
  Administered 2021-04-06 (×2): 0.5 mg via INTRAVENOUS

## 2021-04-06 MED ORDER — MIDAZOLAM HCL 2 MG/2ML IJ SOLN
INTRAMUSCULAR | Status: DC | PRN
  Administered 2021-04-06: 2 mg via INTRAVENOUS

## 2021-04-06 MED ORDER — ROCURONIUM 10MG/ML (10ML) SYRINGE FOR MEDFUSION PUMP - OPTIME
INTRAVENOUS | Status: DC | PRN
  Administered 2021-04-06: 50 mg via INTRAVENOUS

## 2021-04-06 MED ORDER — ENOXAPARIN SODIUM 40 MG/0.4ML IJ SOSY
40.0000 mg | PREFILLED_SYRINGE | INTRAMUSCULAR | Status: DC
  Administered 2021-04-07 – 2021-04-10 (×4): 40 mg via SUBCUTANEOUS
  Filled 2021-04-06 (×4): qty 0.4

## 2021-04-06 MED ORDER — ACETAMINOPHEN 500 MG PO TABS
1000.0000 mg | ORAL_TABLET | ORAL | Status: AC
  Administered 2021-04-06: 1000 mg via ORAL
  Filled 2021-04-06: qty 2

## 2021-04-06 MED ORDER — PROPOFOL 10 MG/ML IV BOLUS
INTRAVENOUS | Status: DC | PRN
  Administered 2021-04-06: 100 mg via INTRAVENOUS

## 2021-04-06 MED ORDER — DEXAMETHASONE SODIUM PHOSPHATE 10 MG/ML IJ SOLN
INTRAMUSCULAR | Status: AC
  Filled 2021-04-06: qty 1

## 2021-04-06 MED ORDER — LIDOCAINE 2% (20 MG/ML) 5 ML SYRINGE
INTRAMUSCULAR | Status: AC
  Filled 2021-04-06: qty 5

## 2021-04-06 MED ORDER — SUGAMMADEX SODIUM 200 MG/2ML IV SOLN
INTRAVENOUS | Status: DC | PRN
  Administered 2021-04-06: 200 mg via INTRAVENOUS

## 2021-04-06 MED ORDER — PHENYLEPHRINE HCL (PRESSORS) 10 MG/ML IV SOLN
INTRAVENOUS | Status: DC | PRN
Start: 2021-04-06 — End: 2021-04-06
  Administered 2021-04-06: 120 ug via INTRAVENOUS
  Administered 2021-04-06: 80 ug via INTRAVENOUS

## 2021-04-06 MED ORDER — LACTATED RINGERS IV SOLN: INTRAVENOUS | Status: DC

## 2021-04-06 MED ORDER — HYDROMORPHONE HCL 1 MG/ML IJ SOLN
INTRAMUSCULAR | Status: AC
  Filled 2021-04-06: qty 1

## 2021-04-06 MED ORDER — PHENYLEPHRINE HCL-NACL 20-0.9 MG/250ML-% IV SOLN
INTRAVENOUS | Status: DC | PRN
  Administered 2021-04-06: 25 ug/min via INTRAVENOUS

## 2021-04-06 MED ORDER — FENTANYL CITRATE (PF) 250 MCG/5ML IJ SOLN
INTRAMUSCULAR | Status: AC
  Filled 2021-04-06: qty 5

## 2021-04-06 MED ORDER — ROCURONIUM BROMIDE 10 MG/ML (PF) SYRINGE
PREFILLED_SYRINGE | INTRAVENOUS | Status: AC
  Filled 2021-04-06: qty 10

## 2021-04-06 MED ORDER — PROPOFOL 10 MG/ML IV BOLUS
INTRAVENOUS | Status: AC
  Filled 2021-04-06: qty 20

## 2021-04-06 MED ORDER — KETOROLAC TROMETHAMINE 15 MG/ML IJ SOLN
15.0000 mg | Freq: Four times a day (QID) | INTRAMUSCULAR | Status: DC | PRN
  Administered 2021-04-06 (×2): 15 mg via INTRAVENOUS
  Filled 2021-04-06 (×2): qty 1

## 2021-04-06 MED ORDER — DEXTROSE-NACL 5-0.9 % IV SOLN: INTRAVENOUS | Status: DC

## 2021-04-06 MED ORDER — LACTATED RINGERS IV SOLN: INTRAVENOUS | Status: DC | PRN

## 2021-04-06 MED ORDER — ONDANSETRON 4 MG PO TBDP: 4.0000 mg | ORAL_TABLET | Freq: Four times a day (QID) | ORAL | Status: DC | PRN

## 2021-04-06 MED ORDER — FENTANYL CITRATE (PF) 250 MCG/5ML IJ SOLN
INTRAMUSCULAR | Status: DC | PRN
  Administered 2021-04-06 (×4): 50 ug via INTRAVENOUS

## 2021-04-06 MED ORDER — CHLORHEXIDINE GLUCONATE 0.12 % MT SOLN
15.0000 mL | Freq: Once | OROMUCOSAL | Status: AC
  Administered 2021-04-06: 15 mL via OROMUCOSAL
  Filled 2021-04-06: qty 15

## 2021-04-06 MED ORDER — CHLORHEXIDINE GLUCONATE CLOTH 2 % EX PADS: 6.0000 | MEDICATED_PAD | Freq: Once | CUTANEOUS | Status: DC

## 2021-04-06 MED ORDER — MIDAZOLAM HCL 2 MG/2ML IJ SOLN
INTRAMUSCULAR | Status: AC
  Filled 2021-04-06: qty 2

## 2021-04-06 MED ORDER — ACETAMINOPHEN 500 MG PO TABS
1000.0000 mg | ORAL_TABLET | Freq: Three times a day (TID) | ORAL | Status: AC
  Administered 2021-04-06 – 2021-04-07 (×3): 1000 mg via ORAL
  Filled 2021-04-06 (×3): qty 2

## 2021-04-06 MED ORDER — LIDOCAINE HCL (CARDIAC) PF 100 MG/5ML IV SOSY
PREFILLED_SYRINGE | INTRAVENOUS | Status: DC | PRN
Start: 2021-04-06 — End: 2021-04-06
  Administered 2021-04-06: 100 mg via INTRAVENOUS

## 2021-04-06 MED ORDER — ONDANSETRON HCL 4 MG/2ML IJ SOLN
4.0000 mg | Freq: Four times a day (QID) | INTRAMUSCULAR | Status: DC | PRN
  Administered 2021-04-06 – 2021-04-07 (×2): 4 mg via INTRAVENOUS
  Filled 2021-04-06 (×2): qty 2

## 2021-04-06 SURGICAL SUPPLY — 72 items
ADH SKN CLS APL DERMABOND .7 (GAUZE/BANDAGES/DRESSINGS) ×1
APL PRP STRL LF DISP 70% ISPRP (MISCELLANEOUS) ×1
BAG COUNTER SPONGE SURGICOUNT (BAG) ×2 IMPLANT
BAG SPNG CNTER NS LX DISP (BAG) ×1
BINDER ABDOMINAL 12 ML 46-62 (SOFTGOODS) ×1 IMPLANT
BLADE CLIPPER SURG (BLADE) IMPLANT
BLADE SURG 11 STRL SS (BLADE) ×2 IMPLANT
CANISTER SUCT 3000ML PPV (MISCELLANEOUS) ×2 IMPLANT
CHLORAPREP W/TINT 26 (MISCELLANEOUS) ×2 IMPLANT
COVER SURGICAL LIGHT HANDLE (MISCELLANEOUS) ×2 IMPLANT
DERMABOND ADVANCED (GAUZE/BANDAGES/DRESSINGS) ×1
DERMABOND ADVANCED .7 DNX12 (GAUZE/BANDAGES/DRESSINGS) IMPLANT
DEVICE TROCAR PUNCTURE CLOSURE (ENDOMECHANICALS) ×1 IMPLANT
DRAIN CHANNEL 19F RND (DRAIN) IMPLANT
DRAPE INCISE IOBAN 66X45 STRL (DRAPES) ×1 IMPLANT
DRAPE LAPAROSCOPIC ABDOMINAL (DRAPES) ×2 IMPLANT
DRAPE WARM FLUID 44X44 (DRAPES) ×2 IMPLANT
DRSG OPSITE POSTOP 4X10 (GAUZE/BANDAGES/DRESSINGS) IMPLANT
DRSG OPSITE POSTOP 4X8 (GAUZE/BANDAGES/DRESSINGS) ×2 IMPLANT
DRSG PAD ABDOMINAL 8X10 ST (GAUZE/BANDAGES/DRESSINGS) ×1 IMPLANT
ELECT BLADE 6.5 EXT (BLADE) IMPLANT
ELECT CAUTERY BLADE 6.4 (BLADE) ×2 IMPLANT
ELECT REM PT RETURN 9FT ADLT (ELECTROSURGICAL) ×2
ELECTRODE REM PT RTRN 9FT ADLT (ELECTROSURGICAL) ×1 IMPLANT
EVACUATOR SILICONE 100CC (DRAIN) IMPLANT
GLOVE SRG 8 PF TXTR STRL LF DI (GLOVE) ×1 IMPLANT
GLOVE SURG SYN 7.5  E (GLOVE) ×2
GLOVE SURG SYN 7.5 E (GLOVE) ×1 IMPLANT
GLOVE SURG SYN 7.5 PF PI (GLOVE) ×1 IMPLANT
GLOVE SURG UNDER POLY LF SZ8 (GLOVE) ×2
GOWN STRL REUS W/ TWL LRG LVL3 (GOWN DISPOSABLE) ×1 IMPLANT
GOWN STRL REUS W/ TWL XL LVL3 (GOWN DISPOSABLE) ×1 IMPLANT
GOWN STRL REUS W/TWL LRG LVL3 (GOWN DISPOSABLE) ×2
GOWN STRL REUS W/TWL XL LVL3 (GOWN DISPOSABLE) ×2
HANDLE SUCTION POOLE (INSTRUMENTS) ×1 IMPLANT
KIT BASIN OR (CUSTOM PROCEDURE TRAY) ×2 IMPLANT
KIT TURNOVER KIT B (KITS) ×2 IMPLANT
LIGASURE IMPACT 36 18CM CVD LR (INSTRUMENTS) IMPLANT
MESH PROLENE PML 12X12 (Mesh General) ×1 IMPLANT
NEEDLE HYPO 22GX1.5 SAFETY (NEEDLE) IMPLANT
NS IRRIG 1000ML POUR BTL (IV SOLUTION) ×4 IMPLANT
PACK GENERAL/GYN (CUSTOM PROCEDURE TRAY) ×2 IMPLANT
PAD ARMBOARD 7.5X6 YLW CONV (MISCELLANEOUS) ×4 IMPLANT
PENCIL SMOKE EVACUATOR (MISCELLANEOUS) ×2 IMPLANT
RETAINER VISCERA MED (MISCELLANEOUS) ×1 IMPLANT
SPECIMEN JAR LARGE (MISCELLANEOUS) IMPLANT
SPONGE T-LAP 18X18 ~~LOC~~+RFID (SPONGE) IMPLANT
STAPLER VISISTAT 35W (STAPLE) ×2 IMPLANT
SUCTION POOLE HANDLE (INSTRUMENTS) ×2
SUT ETHILON 3 0 FSL (SUTURE) IMPLANT
SUT MNCRL AB 4-0 PS2 18 (SUTURE) ×1 IMPLANT
SUT NOVA NAB GS-21 0 18 T12 DT (SUTURE) ×3 IMPLANT
SUT PDS AB 0 CT 36 (SUTURE) IMPLANT
SUT PDS AB 1 TP1 54 (SUTURE) ×6 IMPLANT
SUT SILK 2 0 SH CR/8 (SUTURE) ×2 IMPLANT
SUT SILK 2 0 TIES 10X30 (SUTURE) ×2 IMPLANT
SUT SILK 3 0 SH CR/8 (SUTURE) ×2 IMPLANT
SUT SILK 3 0 TIES 10X30 (SUTURE) ×2 IMPLANT
SUT VIC AB 2-0 SH 27 (SUTURE) ×4
SUT VIC AB 2-0 SH 27X BRD (SUTURE) IMPLANT
SUT VIC AB 3-0 SH 27 (SUTURE) ×2
SUT VIC AB 3-0 SH 27X BRD (SUTURE) IMPLANT
SUT VIC AB 3-0 SH 8-18 (SUTURE) ×2 IMPLANT
SUT VICRYL AB 2 0 TIES (SUTURE) ×2 IMPLANT
SYR CONTROL 10ML LL (SYRINGE) IMPLANT
TOWEL GREEN STERILE (TOWEL DISPOSABLE) ×2 IMPLANT
TOWEL GREEN STERILE FF (TOWEL DISPOSABLE) ×2 IMPLANT
TRAY FOLEY MTR SLVR 16FR STAT (SET/KITS/TRAYS/PACK) ×2 IMPLANT
TRAY FOLEY W/BAG SLVR 16FR (SET/KITS/TRAYS/PACK)
TRAY FOLEY W/BAG SLVR 16FR ST (SET/KITS/TRAYS/PACK) IMPLANT
WATER STERILE IRR 1000ML POUR (IV SOLUTION) ×2 IMPLANT
YANKAUER SUCT BULB TIP NO VENT (SUCTIONS) IMPLANT

## 2021-04-06 NOTE — Interval H&P Note (Signed)
History and Physical Interval Note:  04/06/2021 9:00 AM  Amber Klein  has presented today for surgery, with the diagnosis of INCISIONAL HERNIA.  The various methods of treatment have been discussed with the patient and family. After consideration of risks, benefits and other options for treatment, the patient has consented to  Procedure(s): EXPLORATORY LAPAROTOMY (N/A) LYSIS OF ADHESION (N/A) [place] (N/A) as a surgical intervention.  The patient's history has been reviewed, patient examined, no change in status, stable for surgery.  I have reviewed the patient's chart and labs.  Questions were answered to the patient's satisfaction.     Ralene Ok

## 2021-04-06 NOTE — Op Note (Signed)
04/06/2021  11:44 AM  PATIENT:  Amber Klein  71 y.o. female  PRE-OPERATIVE DIAGNOSIS:  INCISIONAL HERNIA  POST-OPERATIVE DIAGNOSIS:  INCISIONAL HERNIA  PROCEDURE:  Procedure(s): EXPLORATORY LAPAROTOMY (N/A) LYSIS OF ADHESION (N/A) HERNIA REPAIR INCISIONAL REPAIR WITH MESH (N/A) RETRORECTUS  WITH INSERTION OF MESH (N/A)  SURGEON:  Surgeon(s) and Role:    * Ralene Ok, MD - Primary  ASSISTANTS: Garvin Fila, RNFA   ANESTHESIA:   local and general  EBL:  50 mL   BLOOD ADMINISTERED:none  DRAINS: none   LOCAL MEDICATIONS USED:  BUPIVICAINE and Exparel  SPECIMEN:  No Specimen  DISPOSITION OF SPECIMEN:  N/A  COUNTS:  YES  TOURNIQUET:  * No tourniquets in log *  DICTATION: .Dragon Dictation Indication procedure: Patient is a 71 year old female with a 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  Patient subsequently recovered from this and had her colostomy reversed robotically.  Patient with a midline incision from her original operation.  Patient with pain and bulging from the hernia site.  Patient was taken back electively for open lysis of adhesions, incisional hernia repair with mesh.  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  Patient also with a small posterior defect in the previous ostomy site.  A piece of 23 x 15 cm piece of mesh was placed in the retrorectus space..   Details of the procedure: After the patient was consented he was taken back to the operating room and placed in the supine position with bilateral SCDs in place.  General endotracheal anesthesia was begun.  Foley catheter was placed.  The patient was prepped and draped in usual sterile fashion. Antibiotics were confirmed and timeout was called and all facts verified.  At this time I proceeded to excise the skin scar. This was discarded. I proceeded to use  electrocautery to maintain hemostasis and dissection took place in the most superior portion of the wound down to the subcutaneous tissues fat to the anterior fascia. This was incised. The fascia was elevated and 2 Kocher clamps.  The hernia sac and the abdominal cavity were entered bluntly. There appeared to be some omentum within the midline in the superior portion of the wound. This was carefully dissected away from the abdominal wall. The omentum lay in the midline, this was taken down with blunt and sharp dissection circumferentially to the incision.  I proceeded dissect inferiorly.  The fascia was opened up.  There were several Swiss cheese defects that were in the midline.  At this time thin small bowel to fascial adhesions were taken down sharply.  This was able to free up the inferior portion of the fascia.  I was able to visualize the previous ostomy site.  There appeared to be a posterior defect in that area.   At this time I proceeded to retract is rectus muscles medially.  At this time the posterior fascia was then incised.  Using blunt dissection the belly was dissected away from the posterior rectus fascia.  This was done inferiorly and superiorly.  At the midline inferior and superior portions of the linea alba this was taken down from the anterior abdominal wall.  This was done bilaterally.  The left side where the previous ostomy site was located it was apparent there was some weakness in the posterior fascia.  I then cauterized through this previous ostomy site.  At this time the posterior rectus  fascia was then reapproximated using a running 2-0 Vicryl x2.  This was able to close the previous ostomy site and the posterior fascia.  The area was checked for hemostasis.  At this time the posterior rectus fascia was reapproximated using #1 PDS in a standard running fashion x2 using a horizontal mattress suture.  I proceeded to irrigate out the retrorectus space.  The area was measured and seemed  to be approximately 23 x 15 cm in size.  A piece of Ethicon Prolene mesh, 30 x 30 cm was selected and cut to shape.  It was placed into the retrorectus space.  This fit well and flat.  0 Novafil's were used via stab incision x2 bilaterally anterior superior and inferiorly as transfascial sutures.  This was done with the help of a Endo Close device.  Tisseel glue was then used to fasten the mesh to the midline fascia.  At this time the anterior fascia and midline were reapproximated using #1 PDS in a standard running fashion x2.  The subcutaneous tissue was irrigated out with sterile saline.  3-0 Vicryl was used in interrupted fashion to reapproximate the deep dermal layer.  The skin was then reapproximated using 3-0 Monocryl in subcuticular fashion.  The midline wound was then dressed with honeycomb dressing.  The patient tolerated procedure well was taken to the recovery room in stable condition.    PLAN OF CARE: Admit for overnight observation  PATIENT DISPOSITION:  PACU - hemodynamically stable.   Delay start of Pharmacological VTE agent (>24hrs) due to surgical blood loss or risk of bleeding: not applicable

## 2021-04-06 NOTE — Anesthesia Postprocedure Evaluation (Signed)
Anesthesia Post Note  Patient: Amber Klein  Procedure(s) Performed: EXPLORATORY LAPAROTOMY (Abdomen) LYSIS OF ADHESION (Abdomen) HERNIA REPAIR INCISIONAL REPAIR WITH MESH (Abdomen) INSERTION OF MESH (Abdomen)     Patient location during evaluation: PACU Anesthesia Type: General Level of consciousness: awake Pain management: pain level controlled Vital Signs Assessment: post-procedure vital signs reviewed and stable Cardiovascular status: stable Postop Assessment: no apparent nausea or vomiting Anesthetic complications: no   No notable events documented.  Last Vitals:  Vitals:   04/06/21 1342 04/06/21 1354  BP: 117/77 126/90  Pulse: 81 79  Resp: 14 17  Temp: 36.5 C (!) 36.4 C  SpO2: 94% 97%    Last Pain:  Vitals:   04/06/21 1354  TempSrc: Oral  PainSc:                  Jametta Moorehead

## 2021-04-06 NOTE — Anesthesia Postprocedure Evaluation (Signed)
Anesthesia Post Note  Patient: Amber Klein  Procedure(s) Performed: EXPLORATORY LAPAROTOMY (Abdomen) LYSIS OF ADHESION (Abdomen) HERNIA REPAIR INCISIONAL REPAIR WITH MESH (Abdomen) INSERTION OF MESH (Abdomen)     Patient location during evaluation: PACU Anesthesia Type: General Level of consciousness: awake Pain management: pain level controlled Respiratory status: spontaneous breathing Cardiovascular status: stable Postop Assessment: no apparent nausea or vomiting Anesthetic complications: no   No notable events documented.  Last Vitals:  Vitals:   04/06/21 1300 04/06/21 1312  BP: 111/71 113/68  Pulse: 79 75  Resp: 11 11  Temp:    SpO2: 95% 94%    Last Pain:  Vitals:   04/06/21 1300  TempSrc:   PainSc: Asleep                 Rashied Corallo

## 2021-04-06 NOTE — Anesthesia Preprocedure Evaluation (Addendum)
Anesthesia Evaluation  Patient identified by MRN, date of birth, ID band Patient awake    Reviewed: Allergy & Precautions, NPO status , Patient's Chart, lab work & pertinent test results  Airway Mallampati: II  TM Distance: >3 FB     Dental   Pulmonary asthma , pneumonia, former smoker,    breath sounds clear to auscultation       Cardiovascular + dysrhythmias  Rhythm:Regular Rate:Normal     Neuro/Psych    GI/Hepatic Neg liver ROS, GERD  ,History noted Dr. Nyoka Cowden   Endo/Other  negative endocrine ROS  Renal/GU negative Renal ROS     Musculoskeletal   Abdominal   Peds  Hematology   Anesthesia Other Findings   Reproductive/Obstetrics                            Anesthesia Physical Anesthesia Plan  ASA: 3  Anesthesia Plan: General   Post-op Pain Management:    Induction:   PONV Risk Score and Plan: Treatment may vary due to age or medical condition and Ondansetron  Airway Management Planned: Oral ETT  Additional Equipment:   Intra-op Plan:   Post-operative Plan: Extubation in OR  Informed Consent: I have reviewed the patients History and Physical, chart, labs and discussed the procedure including the risks, benefits and alternatives for the proposed anesthesia with the patient or authorized representative who has indicated his/her understanding and acceptance.     Dental advisory given  Plan Discussed with: Anesthesiologist, CRNA and Surgeon  Anesthesia Plan Comments:        Anesthesia Quick Evaluation

## 2021-04-06 NOTE — Anesthesia Procedure Notes (Signed)

## 2021-04-06 NOTE — Transfer of Care (Signed)
Immediate Anesthesia Transfer of Care Note  Patient: Amber Klein  Procedure(s) Performed: EXPLORATORY LAPAROTOMY (Abdomen) LYSIS OF ADHESION (Abdomen) HERNIA REPAIR INCISIONAL REPAIR WITH MESH (Abdomen) INSERTION OF MESH (Abdomen)  Patient Location: PACU  Anesthesia Type:General  Level of Consciousness: oriented, drowsy, patient cooperative and responds to stimulation  Airway & Oxygen Therapy: Patient Spontanous Breathing and Patient connected to nasal cannula oxygen  Post-op Assessment: Report given to RN, Post -op Vital signs reviewed and stable and Patient moving all extremities X 4  Post vital signs: Reviewed and stable  Last Vitals:  Vitals Value Taken Time  BP 120/75 04/06/21 1212  Temp    Pulse 72 04/06/21 1216  Resp 8 04/06/21 1216  SpO2 93 % 04/06/21 1216  Vitals shown include unvalidated device data.  Last Pain:  Vitals:   04/06/21 0748  TempSrc: Oral  PainSc: 6       Patients Stated Pain Goal: 0 (15[birth year] 7948)  Complications: No notable events documented.

## 2021-04-07 ENCOUNTER — Encounter (HOSPITAL_COMMUNITY): Payer: Self-pay | Admitting: General Surgery

## 2021-04-07 LAB — BASIC METABOLIC PANEL
Anion gap: 6 (ref 5–15)
BUN: 8 mg/dL (ref 8–23)
CO2: 30 mmol/L (ref 22–32)
Calcium: 8.6 mg/dL — ABNORMAL LOW (ref 8.9–10.3)
Chloride: 100 mmol/L (ref 98–111)
Creatinine, Ser: 0.66 mg/dL (ref 0.44–1.00)
GFR, Estimated: >60 mL/min (ref >60)
Glucose, Bld: 131 mg/dL — ABNORMAL HIGH (ref 70–99)
Potassium: 4.2 mmol/L (ref 3.5–5.1)
Sodium: 136 mmol/L (ref 135–145)

## 2021-04-07 LAB — CBC
HCT: 35.6 % — ABNORMAL LOW (ref 36.0–46.0)
Hemoglobin: 11.4 g/dL — ABNORMAL LOW (ref 12.0–15.0)
MCH: 29.2 pg (ref 26.0–34.0)
MCHC: 32 g/dL (ref 30.0–36.0)
MCV: 91.3 fL (ref 80.0–100.0)
Platelets: 304 10*3/uL (ref 150–400)
RBC: 3.9 MIL/uL (ref 3.87–5.11)
RDW: 14.3 % (ref 11.5–15.5)
WBC: 11.7 10*3/uL — ABNORMAL HIGH (ref 4.0–10.5)
nRBC: 0 % (ref 0.0–0.2)

## 2021-04-07 MED ORDER — ONDANSETRON HCL 4 MG/2ML IJ SOLN
4.0000 mg | INTRAMUSCULAR | Status: DC | PRN
  Administered 2021-04-07: 4 mg via INTRAVENOUS
  Filled 2021-04-07: qty 2

## 2021-04-07 MED ORDER — ONDANSETRON 4 MG PO TBDP
4.0000 mg | ORAL_TABLET | ORAL | Status: DC | PRN
  Administered 2021-04-07 – 2021-04-08 (×4): 4 mg via ORAL
  Filled 2021-04-07 (×4): qty 1

## 2021-04-07 MED ORDER — ALUM & MAG HYDROXIDE-SIMETH 200-200-20 MG/5ML PO SUSP
15.0000 mL | Freq: Four times a day (QID) | ORAL | Status: DC | PRN
  Administered 2021-04-07: 15 mL via ORAL
  Filled 2021-04-07: qty 30

## 2021-04-07 NOTE — Plan of Care (Signed)
°  Problem: Education: Goal: Knowledge of General Education information will improve Description: Including pain rating scale, medication(s)/side effects and non-pharmacologic comfort measures Outcome: Progressing   Problem: Health Behavior/Discharge Planning: Goal: Ability to manage health-related needs will improve Outcome: Progressing   Problem: Activity: Goal: Risk for activity intolerance will decrease Outcome: Progressing   Problem: Nutrition: Goal: Adequate nutrition will be maintained Outcome: Progressing   Problem: Coping: Goal: Level of anxiety will decrease Outcome: Progressing   Problem: Elimination: Goal: Will not experience complications related to bowel motility Outcome: Progressing Goal: Will not experience complications related to urinary retention Outcome: Progressing   Problem: Pain Managment: Goal: General experience of comfort will improve Outcome: Progressing   Problem: Skin Integrity: Goal: Risk for impaired skin integrity will decrease Outcome: Progressing

## 2021-04-07 NOTE — Progress Notes (Signed)
1 Day Post-Op   Subjective/Chief Complaint: Pt has had some nausea and emesis. Some soreness   Objective: Vital signs in last 24 hours: Temp:  [97.5 F (36.4 C)-98.7 F (37.1 C)] 98.7 F (37.1 C) (02/23 0436) Pulse Rate:  [71-87] 87 (02/23 0436) Resp:  [10-18] 16 (02/23 0436) BP: (100-126)/(62-90) 101/68 (02/23 0436) SpO2:  [89 %-97 %] 94 % (02/23 0436) Last BM Date : 04/06/21  Intake/Output from previous day: 02/22 0701 - 02/23 0700 In: 2901.7 [P.O.:660; I.V.:2241.7] Out: 400 [Urine:150; Emesis/NG output:200; Blood:50] Intake/Output this shift: Total I/O In: 878.8 [P.O.:180; I.V.:698.8] Out: 200 [Emesis/NG output:200]  General appearance: alert and cooperative GI: soft, non-tender; bowel sounds normal; no masses,  no organomegaly and inc c/d/i  Lab Results:  Recent Labs    04/07/21 0610  WBC 11.7*  HGB 11.4*  HCT 35.6*  PLT 304   BMET No results for input(s): NA, K, CL, CO2, GLUCOSE, BUN, CREATININE, CALCIUM in the last 72 hours. PT/INR No results for input(s): LABPROT, INR in the last 72 hours. ABG No results for input(s): PHART, HCO3 in the last 72 hours.  Invalid input(s): PCO2, PO2  Studies/Results: No results found.  Anti-infectives: Anti-infectives (From admission, onward)    Start     Dose/Rate Route Frequency Ordered Stop   04/06/21 0730  ceFAZolin (ANCEF) IVPB 2g/100 mL premix        2 g 200 mL/hr over 30 Minutes Intravenous On call to O.R. 04/06/21 0725 04/06/21 0945       Assessment/Plan: s/p Procedure(s): EXPLORATORY LAPAROTOMY (N/A) LYSIS OF ADHESION (N/A) HERNIA REPAIR INCISIONAL REPAIR WITH MESH (N/A) INSERTION OF MESH (N/A) Advance diet to fulls today to see if that helps with her nausea Mobilize today  Pain control Home hopefully in the AM    LOS: 1 day    Amber Klein 04/07/2021

## 2021-04-07 NOTE — Progress Notes (Signed)
Mobility Specialist Progress Note:   04/07/21 1409  Mobility  Activity Ambulated with assistance in hallway  Level of Assistance Standby assist, set-up cues, supervision of patient - no hands on  Assistive Device Front wheel walker  Distance Ambulated (ft) 100 ft  Activity Response Tolerated well  $Mobility charge 1 Mobility   Pt received in bed willing to participate in mobility. Complaints of 6/10 abdominal pain. Pt left in bed with call bell in reach and all needs met.   [facility] Public librarian Phone [phone]

## 2021-04-08 MED ORDER — ONDANSETRON 4 MG PO TBDP: 4.0000 mg | ORAL_TABLET | ORAL | 0 refills | Status: DC | PRN

## 2021-04-08 MED ORDER — METOCLOPRAMIDE HCL 5 MG/ML IJ SOLN
10.0000 mg | Freq: Four times a day (QID) | INTRAMUSCULAR | Status: AC
  Administered 2021-04-08 – 2021-04-10 (×8): 10 mg via INTRAVENOUS
  Filled 2021-04-08 (×8): qty 2

## 2021-04-08 MED ORDER — TRAMADOL HCL 50 MG PO TABS: 50.0000 mg | ORAL_TABLET | Freq: Four times a day (QID) | ORAL | 0 refills | Status: DC | PRN

## 2021-04-08 NOTE — Discharge Instructions (Signed)
[facility] _______[facility]  HERNIA REPAIR: POST OP INSTRUCTIONS  Always review your discharge instruction sheet given to you by the facility where your surgery was performed. IF YOU HAVE DISABILITY OR FAMILY LEAVE FORMS, YOU MUST BRING THEM TO THE OFFICE FOR PROCESSING.   DO NOT GIVE THEM TO YOUR DOCTOR.  1. A  prescription for pain medication may be given to you upon discharge.  Take your pain medication as prescribed, if needed.  If narcotic pain medicine is not needed, then you may take acetaminophen (Tylenol) or ibuprofen (Advil) as needed. 2. Take your usually prescribed medications unless otherwise directed. If you need a refill on your pain medication, please contact your pharmacy.  They will contact our office to request authorization. Prescriptions will not be filled after 5 pm or on week-ends. 3. You should follow a light diet the first 24 hours after arrival home, such as soup and crackers, etc.  Be sure to include lots of fluids daily.  Resume your normal diet the day after surgery. 4.Most patients will experience some swelling and bruising around the umbilicus or in the groin and scrotum.  Ice packs and reclining will help.  Swelling and bruising can take several days to resolve.  6. It is common to experience some constipation if taking pain medication after surgery.  Increasing fluid intake and taking a stool softener (such as Colace) will usually help or prevent this problem from occurring.  A mild laxative (Milk of Magnesia or Miralax) should be taken according to package directions if there are no bowel movements after 48 hours. 7. Unless discharge instructions indicate otherwise, you may remove your bandages 24-48 hours after surgery, and you may shower at that time.  You may have steri-strips (small skin tapes) in place directly over the incision.  These strips should be left on the skin for 7-10 days.  If your surgeon used skin glue on the incision, you may shower in  24 hours.  The glue will flake off over the next 2-3 weeks.  Any sutures or staples will be removed at the office during your follow-up visit. 8. ACTIVITIES:  You may resume regular (light) daily activities beginning the next day--such as daily self-care, walking, climbing stairs--gradually increasing activities as tolerated.  You may have sexual intercourse when it is comfortable.  Refrain from any heavy lifting or straining until approved by your doctor.  a.You may drive when you are no longer taking prescription pain medication, you can comfortably wear a seatbelt, and you can safely maneuver your car and apply brakes. b.RETURN TO WORK:   _____________________________________________  9.You should see your doctor in the office for a follow-up appointment approximately 2-3 weeks after your surgery.  Make sure that you call for this appointment within a day or two after you arrive home to insure a convenient appointment time. 10.OTHER INSTRUCTIONS: _________________________    _____________________________________  WHEN TO CALL YOUR DOCTOR: Fever over 101.0 Inability to urinate Nausea and/or vomiting Extreme swelling or bruising Continued bleeding from incision. Increased pain, redness, or drainage from the incision  The clinic staff is available to answer your questions during regular business hours.  Please don't hesitate to call and ask to speak to one of the nurses for clinical concerns.  If you have a medical emergency, go to the nearest emergency room or call 911.  A surgeon from [facility] is always on call at the hospital   [address], [address], [place], [place]    [address] ?  [address] [address], [place], [place]   [address] [phone] ? [phone] ? FAX [phone] Web site: [email or web address]  

## 2021-04-08 NOTE — Progress Notes (Signed)
2 Days Post-Op   Subjective/Chief Complaint: Pt with some nausea this AM and yesterday Was able to walk in the hall yesterday No BM   Objective: Vital signs in last 24 hours: Temp:  [98.4 F (36.9 C)-99 F (37.2 C)] 99 F (37.2 C) (02/24 0435) Pulse Rate:  [83-99] 99 (02/24 0435) Resp:  [18-19] 18 (02/24 0435) BP: (100-122)/(67-74) 119/74 (02/24 0435) SpO2:  [92 %-96 %] 92 % (02/24 0435) Last BM Date : 04/06/21  Intake/Output from previous day: 02/23 0701 - 02/24 0700 In: 420 [P.O.:420] Out: -  Intake/Output this shift: No intake/output data recorded.  General appearance: alert and cooperative GI: soft, non-tender; bowel sounds normal; no masses,  no organomegaly and inc c/d/i  Lab Results:  Recent Labs    04/07/21 0610  WBC 11.7*  HGB 11.4*  HCT 35.6*  PLT 304   BMET Recent Labs    04/07/21 0610  NA 136  K 4.2  CL 100  CO2 30  GLUCOSE 131*  BUN 8  CREATININE 0.66  CALCIUM 8.6*   PT/INR No results for input(s): LABPROT, INR in the last 72 hours. ABG No results for input(s): PHART, HCO3 in the last 72 hours.  Invalid input(s): PCO2, PO2  Studies/Results: No results found.  Anti-infectives: Anti-infectives (From admission, onward)    Start     Dose/Rate Route Frequency Ordered Stop   04/06/21 0730  ceFAZolin (ANCEF) IVPB 2g/100 mL premix        2 g 200 mL/hr over 30 Minutes Intravenous On call to O.R. 04/06/21 0725 04/06/21 0945       Assessment/Plan: s/p Procedure(s): EXPLORATORY LAPAROTOMY (N/A) LYSIS OF ADHESION (N/A) HERNIA REPAIR INCISIONAL REPAIR WITH MESH (N/A) INSERTION OF MESH (N/A) Coming along slowly.  Some nausea. -will add reglan for nausea -mobilize -home in next 1-2d  LOS: 2 days    Ralene Ok 04/08/2021

## 2021-04-08 NOTE — Progress Notes (Signed)
Mobility Specialist: Progress Note   04/08/21 1055  Mobility  Activity Ambulated with assistance in hallway  Level of Assistance Modified independent, requires aide device or extra time  Assistive Device Front wheel walker  Distance Ambulated (ft) 180 ft  Activity Response Tolerated well  $Mobility charge 1 Mobility   Pt received in bed and agreeable to mobility. C/o 7/10 abdominal pain when sitting EOB from supine but reduced to 5/10 during ambulation. No other c/o throughout. Pt back to bed with call bell at her side and pt's husband present in the room.   [facility] Mobility Specialist Mobility Specialist [address]: [phone] Mobility Specialist [address]: [phone]

## 2021-04-09 MED ORDER — POLYETHYLENE GLYCOL 3350 17 G PO PACK
17.0000 g | PACK | Freq: Every day | ORAL | Status: DC
Start: 2021-04-09 — End: 2021-04-10
  Administered 2021-04-09 – 2021-04-10 (×2): 17 g via ORAL
  Filled 2021-04-09 (×2): qty 1

## 2021-04-09 NOTE — Progress Notes (Signed)
3 Days Post-Op   Subjective/Chief Complaint: Patient reports minimal pain but still with some nausea Has not had a BM yet   Objective: Vital signs in last 24 hours: Temp:  [98.1 F (36.7 C)-100.3 F (37.9 C)] 99.9 F (37.7 C) (02/25 0409) Pulse Rate:  [100-104] 104 (02/25 0409) Resp:  [18-19] 19 (02/25 0409) BP: (110-125)/(69-80) 110/69 (02/25 0409) SpO2:  [91 %-93 %] 92 % (02/25 0409) Last BM Date : 04/06/21  Intake/Output from previous day: 02/24 0701 - 02/25 0700 In: 1080 [P.O.:1080] Out: -  Intake/Output this shift: No intake/output data recorded.  Exam: Awake and alert Abdomen soft, binder in place  Lab Results:  Recent Labs    04/07/21 0610  WBC 11.7*  HGB 11.4*  HCT 35.6*  PLT 304   BMET Recent Labs    04/07/21 0610  NA 136  K 4.2  CL 100  CO2 30  GLUCOSE 131*  BUN 8  CREATININE 0.66  CALCIUM 8.6*   PT/INR No results for input(s): LABPROT, INR in the last 72 hours. ABG No results for input(s): PHART, HCO3 in the last 72 hours.  Invalid input(s): PCO2, PO2  Studies/Results: No results found.  Anti-infectives: Anti-infectives (From admission, onward)    Start     Dose/Rate Route Frequency Ordered Stop   04/06/21 0730  ceFAZolin (ANCEF) IVPB 2g/100 mL premix        2 g 200 mL/hr over 30 Minutes Intravenous On call to O.R. 04/06/21 0725 04/06/21 0945       Assessment/Plan: s/p Procedure(s): EXPLORATORY LAPAROTOMY (N/A) LYSIS OF ADHESION (N/A) HERNIA REPAIR INCISIONAL REPAIR WITH MESH (N/A) INSERTION OF MESH (N/A)  Will try miralax today Ambulate Continue current plans Likely home tomorrow  LOS: 3 days    Coralie Keens 04/09/2021

## 2021-04-10 MED ORDER — ACETAMINOPHEN 325 MG PO TABS
650.0000 mg | ORAL_TABLET | Freq: Four times a day (QID) | ORAL | Status: DC | PRN
  Administered 2021-04-10: 650 mg via ORAL
  Filled 2021-04-10: qty 2

## 2021-04-10 MED ORDER — BISACODYL 10 MG RE SUPP
10.0000 mg | Freq: Once | RECTAL | Status: AC
Start: 2021-04-10 — End: 2021-04-10
  Administered 2021-04-10: 10 mg via RECTAL
  Filled 2021-04-10: qty 1

## 2021-04-10 NOTE — Plan of Care (Signed)
Pt has had a BM and wants to go home, DC order placed by Dr. Dema Severin. Reviewed all DC instructions and copy given.

## 2021-04-10 NOTE — Progress Notes (Signed)
°  Transition of Care [place] [facility]) Screening Note   Patient Details  Name: Amber Klein Date of Birth: 1950-10-11   Transition of Care [facility]) CM/SW Contact:    Bartholomew Crews, RN Phone Number: [phone] 04/10/2021, 2:45 PM    Transition of Care Department [facility]) has reviewed patient and no TOC needs have been identified at this time.

## 2021-04-10 NOTE — Progress Notes (Signed)
Pt walking in the hallway with the mobility tech, Jillian, I obtained the rocking chair so she could sit in it. Miralax and dulcolax suppository given.

## 2021-04-10 NOTE — Discharge Summary (Signed)
Patient ID: Amber Klein MRN: [id number] DOB/AGE: 06/17/1950 71 y.o.  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   Colonic obstruction ([place]) 12/08/2020   Diverticulosis of colon 10/08/2020   Intractable vomiting 07/06/2020   Hypoxia 07/06/2020   Irritant contact dermatitis associated with stoma 06/25/2020   Diverticulitis 05/21/2020   GERD (gastroesophageal reflux disease)    Acute suppurative peritonitis ([place]) 04/11/2020   Perforated sigmoid colon ([place]) 04/10/2020   Other constipation 04/06/2020   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 Her diet was gradually advanced which she tolerated. Began having spontaneous bowel function and on 2/26 had bm. She was comfortable with and stable for discharge home.    Allergies as of 04/10/2021       Reactions   Iodinated Contrast Media Anaphylaxis   Shellfish Allergy Anaphylaxis   Vancomycin Anaphylaxis   Wasp Venom Anaphylaxis   Ciprofloxacin Hives, Swelling   Hives   Sulfa Antibiotics Swelling, Other (See Comments)   Migraine   Zithromax [azithromycin] Hives   Latex Rash   Sensitivity on too long   Propofol    Couldn't wake patient up - needs lower doses         Medication List     TAKE these medications    albuterol 108 (90 Base) MCG/ACT inhaler Commonly known as: VENTOLIN HFA INHALE 2 PUFFS INTO THE LUNGS EVERY 6 HOURS AS NEEDED FOR WHEEZING OR SHORTNESS OF BREATH   EPINEPHrine 0.3 MG/0.3ML Sosy Inject 0.3 mLs (0.3 mg total) into the muscle as needed for  anaphylaxis   fexofenadine-pseudoephedrine 60-120 MG 12 hr tablet Commonly known as: ALLEGRA-D Take 1 tablet by mouth daily.   latanoprost 0.005 % ophthalmic solution Commonly known as: XALATAN Place 1 drop into both eyes at bedtime.   multivitamin with minerals Tabs tablet Take 1 tablet by mouth daily.   ondansetron 4 MG disintegrating tablet Commonly known as: ZOFRAN-ODT Take 1 tablet (4 mg total) by mouth every 4 (four) hours as needed for nausea.   traMADol 50 MG tablet Commonly known as: ULTRAM Take 1 tablet (50 mg total) by mouth every 6 (six) hours as needed (mild pain).          Follow-up Information     Ralene Ok, MD. Schedule an appointment as soon as possible for a visit in 2 week(s).   Specialty: General Surgery Why: Post op visit Contact information: [address] [place] [address] [phone]                 Sharon Mt. Dema Severin, M.D. [facility]

## 2021-04-10 NOTE — Progress Notes (Signed)
Patient ID: Amber Klein, female   DOB: 05/11/1950, 71 y.o.   MRN: [id number] [facility] Progress Note  4 Days Post-Op  Subjective: CC-  Feeling better but still has not had a BM. Passing some flatus. Nausea improved. Tolerating diet.  Objective: Vital signs in last 24 hours: Temp:  [98.3 F (36.8 C)-99.8 F (37.7 C)] 98.3 F (36.8 C) (02/26 0455) Pulse Rate:  [85-98] 85 (02/26 0455) Resp:  [18] 18 (02/26 0455) BP: (83-120)/(74-80) 116/76 (02/26 0455) SpO2:  [92 %-95 %] 94 % (02/26 0455) Last BM Date : 04/06/21  Intake/Output from previous day: 02/25 0701 - 02/26 0700 In: 360 [P.O.:360] Out: -  Intake/Output this shift: No intake/output data recorded.  PE: Abd: Soft, ND, nontender, +BS, incision cdi  Lab Results:  No results for input(s): WBC, HGB, HCT, PLT in the last 72 hours. BMET No results for input(s): NA, K, CL, CO2, GLUCOSE, BUN, CREATININE, CALCIUM in the last 72 hours. PT/INR No results for input(s): LABPROT, INR in the last 72 hours. CMP     Component Value Date/Time   NA 136 04/07/2021 0610   K 4.2 04/07/2021 0610   CL 100 04/07/2021 0610   CO2 30 04/07/2021 0610   GLUCOSE 131 (H) 04/07/2021 0610   BUN 8 04/07/2021 0610   CREATININE 0.66 04/07/2021 0610   CALCIUM 8.6 (L) 04/07/2021 0610   PROT 6.9 01/28/2021 0948   ALBUMIN 3.9 01/28/2021 0948   AST 19 01/28/2021 0948   ALT 20 01/28/2021 0948   ALKPHOS 104 01/28/2021 0948   BILITOT 0.5 01/28/2021 0948   GFRNONAA >60 04/07/2021 0610   GFRAA >60 01/18/2018 1116   Lipase     Component Value Date/Time   LIPASE 18 12/08/2020 0923       Studies/Results: No results found.  Anti-infectives: Anti-infectives (From admission, onward)    Start     Dose/Rate Route Frequency Ordered Stop   04/06/21 0730  ceFAZolin (ANCEF) IVPB 2g/100 mL premix        2 g 200 mL/hr over 30 Minutes Intravenous On call to O.R. 04/06/21 0725 04/06/21 0945        Assessment/Plan POD#4 s/p  EXPLORATORY LAPAROTOMY (N/A) LYSIS OF ADHESION (N/A) HERNIA REPAIR INCISIONAL REPAIR WITH MESH (N/A) RETRORECTUS  WITH INSERTION OF MESH (N/A) 2/22 Dr. Rosendo Gros - Dulcolax suppository. If this does not work she'll try the miralax again. Hopefully home later today if she has a BM.  ID - ancef periop FEN - soft diet VTE - lovenox Foley - none    LOS: 4 days    Wellington Hampshire, [facility] 04/10/2021, 8:18 AM Please see Amion for pager number during day hours 7:00am-4:30pm

## 2021-04-10 NOTE — Progress Notes (Signed)
Mobility Specialist Progress Note    04/10/21 0945  Mobility  Bed Position Chair  Activity Ambulated independently in hallway  Level of Assistance Standby assist, set-up cues, supervision of patient - no hands on  Assistive Device  (hallway railings)  Distance Ambulated (ft) 430 ft  Activity Response Tolerated well  $Mobility charge 1 Mobility   Pt received standing in room and agreeable. C/o feeling like her BM is building up and can't get it out. Returned to chair with call bell in reach.   [facility] Mobility Specialist  M.S. 5N: [phone]

## 2021-04-10 NOTE — Progress Notes (Signed)
Pt had a loose BM and wants to go home. Dr. Dema Severin contacted.

## 2021-06-02 ENCOUNTER — Encounter: Payer: Self-pay | Admitting: Family Medicine

## 2021-06-02 ENCOUNTER — Telehealth (INDEPENDENT_AMBULATORY_CARE_PROVIDER_SITE_OTHER): Payer: 59 | Admitting: Family Medicine

## 2021-06-02 VITALS — Temp 100.2°F

## 2021-06-02 DIAGNOSIS — R519 Headache, unspecified: Secondary | ICD-10-CM

## 2021-06-02 DIAGNOSIS — R0981 Nasal congestion: Secondary | ICD-10-CM

## 2021-06-02 MED ORDER — DOXYCYCLINE HYCLATE 100 MG PO TABS: 100.0000 mg | ORAL_TABLET | Freq: Two times a day (BID) | ORAL | 0 refills | Status: DC

## 2021-06-02 NOTE — Patient Instructions (Signed)
-  I sent the medication(s) we discussed to your pharmacy: ?Meds ordered this encounter  ?Medications  ? doxycycline (VIBRA-TABS) 100 MG tablet  ?  Sig: Take 1 tablet (100 mg total) by mouth 2 (two) times daily.  ?  Dispense:  20 tablet  ?  Refill:  0  ? ?Continue your allergy regimen and saline sinus rinses.  ? ?I hope you are feeling better soon! ? ?Seek in person care promptly if your symptoms worsen, new concerns arise or you are not improving with treatment. ? ?It was nice to meet you today. I help [facility] out with telemedicine visits on Tuesdays and Thursdays and am happy to help if you need a virtual follow up visit on those days. Otherwise, if you have any concerns or questions following this visit please schedule a follow up visit with your Primary Care office or seek care at a local urgent care clinic to avoid delays in care ? ?

## 2021-06-02 NOTE — Progress Notes (Signed)
Virtual Visit via Video Note ? ?I connected with Amber Klein ? on 06/02/21 at 10:00 AM EDT by a video enabled telemedicine application and verified that I am speaking with the correct person using two identifiers. ? Location patient: Amber Klein ?Location provider:work or home office ?Persons participating in the virtual visit: patient, provider ? ?I discussed the limitations and requested verbal permission for telemedicine visit. The patient expressed understanding and agreed to proceed. ? ? ?HPI: ? ?Acute telemedicine visit for sinus issues: ?-Onset: 2+ weeks ago with pollen, but has worsened now ?-Symptoms include: nasal congestion, thick sinus drainage, now with L maxillary sinus discomfort, low grade fevers the last few days ?-had negative covid test  ?-Denies:CP, SOB, NVD, inability tol oral intake ?-Has tried: allegra, otc cold and allergy medications ?-Pertinent past medical history: see below ?-Pertinent medication allergies: ?Allergies  ?Allergen Reactions  ? Iodinated Contrast Media Anaphylaxis  ? Shellfish Allergy Anaphylaxis  ? Vancomycin Anaphylaxis  ? Wasp Venom Anaphylaxis  ? Ciprofloxacin Hives and Swelling  ?  Hives  ? Sulfa Antibiotics Swelling and Other (See Comments)  ?  Migraine  ? Zithromax [Azithromycin] Hives  ? Latex Rash  ?  Sensitivity on too long  ? Propofol   ?  Couldn't wake patient up - needs lower doses   ?-COVID-19 vaccine status:  ?Immunization History  ?Administered Date(s) Administered  ? Influenza-Unspecified 01/13/2014, 11/14/2015, 10/14/2016, 10/14/2017, 11/14/2018, 11/06/2020  ? Moderna Covid-19 Vaccine Bivalent Booster 77yr & up 11/06/2020  ? PFIZER(Purple Top)SARS-COV-2 Vaccination 03/20/2019, 04/14/2019, 12/13/2019, 07/17/2020  ? Pneumococcal-Unspecified 02/13/2018  ? Tdap 05/22/2019  ? Zoster Recombinat (Shingrix) 02/14/2019, 03/17/2019  ? ? ? ?ROS: See pertinent positives and negatives per HPI. ? ?Past Medical History:  ?Diagnosis Date  ? Arthritis   ? Asthma   ? mild - allergy season  induced  ? Cancer ([facility]   ? basal cell skin cancer  ? Complication of anesthesia   ? Sensitive to propofol - and BP drops  ? Dysrhythmia   ? Ear infection   ? Environmental and seasonal allergies   ? Femoral hernia of right side with obstruction 01/18/2018  ? GERD (gastroesophageal reflux disease)   ? no longer having issues takes TUMS occ.  ? History of kidney stones   ? Hypotension   ? Irritant contact dermatitis associated with stoma 06/25/2020  ? Pneumonia 02/2016  ? walking pneumonia   ? RBBB   ? Sinus infection   ? ? ?Past Surgical History:  ?Procedure Laterality Date  ? CATARACT EXTRACTION    ? [place] ? CO2 LASER APPLICATION N/A 035/57/3220 ? Procedure: CO2 LASER APPLICATION;  Surgeon: KVanessa Kick MD;  [facility];  Service: Gynecology;  Laterality: N/A;  ? COLON SURGERY  04/10/2020  ? with sepsis and colostomy  ? COLONOSCOPY    ? COLONOSCOPY WITH PROPOFOL N/A 11/11/2020  ? Procedure: COLONOSCOPY WITH PROPOFOL;  Surgeon: JMilus Banister MD;  [facility];  Service: Endoscopy;  Laterality: N/A;  NEEDS PREVISIT WITH ANESTHESIA  ? FOOT SURGERY Right 2009  ? INCISIONAL HERNIA REPAIR N/A 04/06/2021  ? Procedure: HERNIA REPAIR INCISIONAL REPAIR WITH MESH;  Surgeon: RRalene Ok MD;  [place] [facility]  Service: General;  Laterality: N/A;  ? INGUINAL HERNIA REPAIR Right 01/18/2018  ? Procedure: OPEN REPAIR INCARCERATED RIGHT FEMORAL HERNIA WITH MESH;  Surgeon: IFanny Skates MD;  [place] [facility]  Service: General;  Laterality: Right;  GENERAL / TAP BLOCK  ? INSERTION OF  MESH N/A 04/06/2021  ? Procedure: INSERTION OF MESH;  Surgeon: Ralene Ok, MD;  Location: [place];  Service: General;  Laterality: N/A;  ? KNEE SURGERY Left 2016  ? LAPAROTOMY N/A 04/06/2021  ? Procedure: EXPLORATORY LAPAROTOMY;  Surgeon: Ralene Ok, MD;  Location: [place];  Service: General;  Laterality: N/A;  ? LYSIS OF ADHESION N/A 04/06/2021  ? Procedure: LYSIS OF ADHESION;  Surgeon: Ralene Ok, MD;  Location: [place];  Service: General;  Laterality: N/A;  ? ORIF ELBOW FRACTURE Left 07/04/2014  ? Procedure: OPEN REDUCTION INTERNAL FIXATION (ORIF) ELBOW/OLECRANON FRACTURE;  Surgeon: Meredith Pel, MD;  [facility];  Service: Orthopedics;  Laterality: Left;  ? STOMA DILATATION N/A 12/08/2020  ? Procedure: STOMA DILATATION WITH STOMAPLASTY;  Surgeon: Johnathan Hausen, MD;  [facility];  Service: General;  Laterality: N/A;  ? VULVECTOMY N/A 08/10/2016  ? Procedure: WIDE EXCISION VULVECTOMY;  Surgeon: Vanessa Kick, MD;  Location: [facility];  Service: Gynecology;  Laterality: N/A;  ? XI ROBOTIC ASSISTED COLOSTOMY TAKEDOWN N/A 01/05/2021  ? Procedure: ROBOTIC COLOSTOMY REVERSAL WITH LAPAROSCOPIC LYSIS OF ADHESIONS;  Surgeon: Leighton Ruff, MD;  [facility];  Service: General;  Laterality: N/A;  ? ? ? ?Current Outpatient Medications:  ?  albuterol (VENTOLIN HFA) 108 (90 Base) MCG/ACT inhaler, INHALE 2 PUFFS INTO THE LUNGS EVERY 6 HOURS AS NEEDED FOR WHEEZING OR SHORTNESS OF BREATH, Disp: 8.5 g, Rfl: 2 ?  doxycycline (VIBRA-TABS) 100 MG tablet, Take 1 tablet (100 mg total) by mouth 2 (two) times daily., Disp: 20 tablet, Rfl: 0 ?  EPINEPHrine 0.3 MG/0.3ML SOSY, Inject 0.3 mLs (0.3 mg total) into the muscle as needed for anaphylaxis, Disp: 1 each, Rfl: 2 ?  fexofenadine-pseudoephedrine (ALLEGRA-D) 60-120 MG 12 hr tablet, Take 1 tablet by mouth daily., Disp: , Rfl:  ?  latanoprost (XALATAN) 0.005 % ophthalmic solution, Place 1 drop into both eyes at bedtime., Disp: , Rfl:  ?  Multiple Vitamin (MULTIVITAMIN WITH MINERALS) TABS tablet, Take 1 tablet by mouth daily., Disp: , Rfl:  ? ?EXAM: ? ?VITALS per patient if applicable: ? ?GENERAL: alert, oriented, appears well and in no acute distress ? ?HEENT: atraumatic, conjunttiva clear, no obvious abnormalities on inspection of external nose and ears ? ?NECK: normal movements of the head and neck ? ?LUNGS: on inspection no signs of respiratory distress,  breathing rate appears normal, no obvious gross SOB, gasping or wheezing ? ?CV: no obvious cyanosis ? ?MS: moves all visible extremities without noticeable abnormality ? ?PSYCH/NEURO: pleasant and cooperative, no obvious depression or anxiety, speech and thought processing grossly intact ? ?ASSESSMENT AND PLAN: ? ?Discussed the following assessment and plan: ? ?Nasal congestion ? ?Facial discomfort ? ?-we discussed possible serious and likely etiologies, options for evaluation and workup, limitations of telemedicine visit vs in person visit, treatment, treatment risks and precautions. Pt is agreeable to treatment via telemedicine at this moment. Query bacterial sinusitis/resp inf vs viral infection vs other. She has opted to start empiric doxy after discussion options/risks. She also agrees to repeat covid testing per cdc guidance.  ?Advised to seek prompt virtual visit or in person care if worsening, new symptoms arise, or if is not improving with treatment as expected per our conversation of expected course. Discussed options for follow up care. Did let this patient know that I do telemedicine on Tuesdays and Thursdays for [place] and those are the days I am logged into the system. ?I discussed the assessment and treatment plan with the  patient. The patient was provided an opportunity to ask questions and all were answered. The patient agreed with the plan and demonstrated an understanding of the instructions. ?  ? ? ?Lucretia Kern, DO  ? ?

## 2021-07-25 ENCOUNTER — Encounter: Payer: Self-pay | Admitting: Internal Medicine

## 2021-07-25 ENCOUNTER — Ambulatory Visit (INDEPENDENT_AMBULATORY_CARE_PROVIDER_SITE_OTHER): Payer: 59 | Admitting: Internal Medicine

## 2021-07-25 VITALS — BP 110/80 | HR 95 | Temp 98.2°F | Wt 164.4 lb

## 2021-07-25 DIAGNOSIS — Z1283 Encounter for screening for malignant neoplasm of skin: Secondary | ICD-10-CM

## 2021-07-25 DIAGNOSIS — K219 Gastro-esophageal reflux disease without esophagitis: Secondary | ICD-10-CM | POA: Diagnosis not present

## 2021-07-25 DIAGNOSIS — R238 Other skin changes: Secondary | ICD-10-CM

## 2021-07-25 MED ORDER — ALBUTEROL SULFATE HFA 108 (90 BASE) MCG/ACT IN AERS: 2.0000 | INHALATION_SPRAY | Freq: Four times a day (QID) | RESPIRATORY_TRACT | 2 refills | Status: DC | PRN

## 2021-07-25 NOTE — Progress Notes (Signed)
Established Patient Office Visit     CC/Reason for Visit: "Spot on nose"  HPI: Amber Klein is a 71 y.o. female who is coming in today for the above mentioned reasons.  A family friend who is a physician noticed a spot on her nose and recommended that I evaluate it.  She states she thought it was a pimple.  It has been present for a few months but she will sometimes scratch it thus starting the healing process all over again.  She is requesting an albuterol refill.  Past Medical/Surgical History: Past Medical History:  Diagnosis Date   Arthritis    Asthma    mild - allergy season induced   Cancer ([place])    basal cell skin cancer   Complication of anesthesia    Sensitive to propofol - and BP drops   Dysrhythmia    Ear infection    Environmental and seasonal allergies    Femoral hernia of right side with obstruction 01/18/2018   GERD (gastroesophageal reflux disease)    no longer having issues takes TUMS occ.   History of kidney stones    Hypotension    Irritant contact dermatitis associated with stoma 06/25/2020   Pneumonia 02/2016   walking pneumonia    RBBB    Sinus infection     Past Surgical History:  Procedure Laterality Date   CATARACT EXTRACTION     CESAREAN SECTION  1979   3546   CO2 LASER APPLICATION N/A 56/81/2751   Procedure: CO2 LASER APPLICATION;  Surgeon: Vanessa Kick, MD;  Location: [facility];  Service: Gynecology;  Laterality: N/A;   COLON SURGERY  04/10/2020   with sepsis and colostomy   COLONOSCOPY     COLONOSCOPY WITH PROPOFOL N/A 11/11/2020   Procedure: COLONOSCOPY WITH PROPOFOL;  Surgeon: Milus Banister, MD;  [facility];  Service: Endoscopy;  Laterality: N/A;  NEEDS PREVISIT WITH ANESTHESIA   FOOT SURGERY Right 2009   INCISIONAL HERNIA REPAIR N/A 04/06/2021   Procedure: HERNIA REPAIR INCISIONAL REPAIR WITH MESH;  Surgeon: Ralene Ok, MD;  Location: [place];  Service: General;  Laterality: N/A;   INGUINAL HERNIA REPAIR  Right 01/18/2018   Procedure: OPEN REPAIR INCARCERATED RIGHT FEMORAL HERNIA WITH MESH;  Surgeon: Fanny Skates, MD;  Location: [place];  Service: General;  Laterality: Right;  GENERAL / TAP BLOCK   INSERTION OF MESH N/A 04/06/2021   Procedure: INSERTION OF MESH;  Surgeon: Ralene Ok, MD;  Location: [place];  Service: General;  Laterality: N/A;   KNEE SURGERY Left 2016   LAPAROTOMY N/A 04/06/2021   Procedure: EXPLORATORY LAPAROTOMY;  Surgeon: Ralene Ok, MD;  Location: [place];  Service: General;  Laterality: N/A;   LYSIS OF ADHESION N/A 04/06/2021   Procedure: LYSIS OF ADHESION;  Surgeon: Ralene Ok, MD;  Location: [place];  Service: General;  Laterality: N/A;   ORIF ELBOW FRACTURE Left 07/04/2014   Procedure: OPEN REDUCTION INTERNAL FIXATION (ORIF) ELBOW/OLECRANON FRACTURE;  Surgeon: Meredith Pel, MD;  [facility];  Service: Orthopedics;  Laterality: Left;   STOMA DILATATION N/A 12/08/2020   Procedure: STOMA DILATATION WITH STOMAPLASTY;  Surgeon: Johnathan Hausen, MD;  [facility];  Service: General;  Laterality: N/A;   VULVECTOMY N/A 08/10/2016   Procedure: WIDE EXCISION VULVECTOMY;  Surgeon: Vanessa Kick, MD;  Location: [facility];  Service: Gynecology;  Laterality: N/A;   XI ROBOTIC ASSISTED COLOSTOMY TAKEDOWN N/A 01/05/2021   Procedure: ROBOTIC COLOSTOMY REVERSAL WITH LAPAROSCOPIC LYSIS OF  ADHESIONS;  Surgeon: Leighton Ruff, MD;  [facility];  Service: General;  Laterality: N/A;    Social History:  reports that she quit smoking about 6 years ago. Her smoking use included cigarettes. She has a 4.00 pack-year smoking history. She has never used smokeless tobacco. She reports current alcohol use. She reports that she does not use drugs.  Allergies: Allergies  Allergen Reactions   Iodinated Contrast Media Anaphylaxis   Shellfish Allergy Anaphylaxis   Vancomycin Anaphylaxis   Wasp Venom Anaphylaxis   Ciprofloxacin Hives and Swelling    Hives   Sulfa  Antibiotics Swelling and Other (See Comments)    Migraine   Zithromax [Azithromycin] Hives   Latex Rash    Sensitivity on too long   Propofol     Couldn't wake patient up - needs lower doses     Family History:  Family History  Problem Relation Age of Onset   Dementia Mother    Kidney cancer Father    Breast cancer Maternal Grandmother    Colon cancer Neg Hx    Esophageal cancer Neg Hx    Pancreatic cancer Neg Hx    Stomach cancer Neg Hx      Current Outpatient Medications:    EPINEPHrine 0.3 MG/0.3ML SOSY, Inject 0.3 mLs (0.3 mg total) into the muscle as needed for anaphylaxis, Disp: 1 each, Rfl: 2   fexofenadine-pseudoephedrine (ALLEGRA-D) 60-120 MG 12 hr tablet, Take 1 tablet by mouth daily., Disp: , Rfl:    latanoprost (XALATAN) 0.005 % ophthalmic solution, Place 1 drop into both eyes at bedtime., Disp: , Rfl:    Multiple Vitamin (MULTIVITAMIN WITH MINERALS) TABS tablet, Take 1 tablet by mouth daily., Disp: , Rfl:    albuterol (VENTOLIN HFA) 108 (90 Base) MCG/ACT inhaler, Inhale 2 puffs into the lungs every 6 (six) hours as needed for wheezing or shortness of breath., Disp: 8.5 g, Rfl: 2  Review of Systems:  Constitutional: Denies fever, chills, diaphoresis, appetite change and fatigue.  HEENT: Denies photophobia, eye pain, redness, hearing loss, ear pain, congestion, sore throat, rhinorrhea, sneezing, mouth sores, trouble swallowing, neck pain, neck stiffness and tinnitus.   Respiratory: Denies SOB, DOE, cough, chest tightness,  and wheezing.   Cardiovascular: Denies chest pain, palpitations and leg swelling.  Gastrointestinal: Denies nausea, vomiting, abdominal pain, diarrhea, constipation, blood in stool and abdominal distention.  Genitourinary: Denies dysuria, urgency, frequency, hematuria, flank pain and difficulty urinating.  Endocrine: Denies: hot or cold intolerance, sweats, changes in hair or nails, polyuria, polydipsia. Musculoskeletal: Denies myalgias, back pain,  joint swelling, arthralgias and gait problem.  Skin: Denies pallor, rash and wound.  Neurological: Denies dizziness, seizures, syncope, weakness, light-headedness, numbness and headaches.  Hematological: Denies adenopathy. Easy bruising, personal or family bleeding history  Psychiatric/Behavioral: Denies suicidal ideation, mood changes, confusion, nervousness, sleep disturbance and agitation    Physical Exam: Vitals:   07/25/21 1459  BP: 110/80  Pulse: 95  Temp: 98.2 F (36.8 C)  TempSrc: Oral  SpO2: 97%  Weight: 164 lb 6.4 oz (74.6 kg)    Body mass index is 28.21 kg/m.   Constitutional: NAD, calm, comfortable Eyes: PERRL, lids and conjunctivae normal, wears corrective lenses ENMT: Mucous membranes are moist.  Skin: Small papule that appears scabbed over over her left nostril. Psychiatric: Normal judgment and insight. Alert and oriented x 3. Normal mood.    Impression and Plan:  Screening exam for skin cancer - Plan: Ambulatory referral to Dermatology  Gastroesophageal reflux disease without esophagitis - Plan:  albuterol (VENTOLIN HFA) 108 (90 Base) MCG/ACT inhaler  Papule of skin  -Referral to dermatology placed, this does not look ominous to me.  Albuterol refill given.    Time spent:21 minutes reviewing chart, interviewing and examining patient and formulating plan of care.     Lelon Frohlich, MD [facility]

## 2021-08-15 ENCOUNTER — Ambulatory Visit (INDEPENDENT_AMBULATORY_CARE_PROVIDER_SITE_OTHER): Payer: 59 | Admitting: Family

## 2021-08-15 ENCOUNTER — Encounter: Payer: Self-pay | Admitting: Family

## 2021-08-15 ENCOUNTER — Encounter: Payer: Self-pay | Admitting: Internal Medicine

## 2021-08-15 VITALS — BP 120/82 | HR 91 | Temp 98.4°F | Wt 163.9 lb

## 2021-08-15 DIAGNOSIS — S20469A Insect bite (nonvenomous) of unspecified back wall of thorax, initial encounter: Secondary | ICD-10-CM | POA: Diagnosis not present

## 2021-08-15 MED ORDER — PREDNISONE 20 MG PO TABS
20.0000 mg | ORAL_TABLET | Freq: Every day | ORAL | 0 refills | Status: DC
Start: 2021-08-15 — End: 2021-11-17

## 2021-08-15 NOTE — Progress Notes (Signed)
   Acute Office Visit  Subjective:     Patient ID: Amber Klein, female    DOB: 1950-07-10, 71 y.o.   MRN: [id number]  Chief Complaint  Patient presents with   Insect Bite    Does not know what bit her; happened x 1 week ago.Right flank. Area is red, pruritis & burning at site. Denies fever. Took Benadryl, last dose last pm.     HPI Patient is in today with c/o an insect bite that occurred 1 week ago while she was outside. She has been taking benadryl to help with the redness, itching, and burning. The symptoms are ongoing. Family is worried because she has a history of sepsis  Review of Systems  Skin:  Positive for itching and rash.       Insect bite with redness, itching, and burning x 1 week  All other systems reviewed and are negative.       Objective:    BP 120/82 (BP Location: Left Arm, Patient Position: Sitting, Cuff Size: Normal)   Pulse 91   Temp 98.4 F (36.9 C) (Oral)   Wt 163 lb 14.4 oz (74.3 kg)   SpO2 96%   BMI 28.12 kg/m    Physical Exam Vitals and nursing note reviewed.  Constitutional:      Appearance: Normal appearance.  Cardiovascular:     Rate and Rhythm: Normal rate and regular rhythm.  Pulmonary:     Effort: Pulmonary effort is normal.     Breath sounds: Normal breath sounds.       Comments: Mild erythema, pruritic, rash noted to the right back. Mildly tender. No drainage or discharge  Neurological:     General: No focal deficit present.     Mental Status: She is alert and oriented to person, place, and time.  Psychiatric:        Mood and Affect: Mood normal.        Behavior: Behavior normal.     No results found for any visits on 08/15/21.      Assessment & Plan:   Problem List Items Addressed This Visit   None Visit Diagnoses     Insect bite of back, unspecified laterality, initial encounter    -  Primary       Meds ordered this encounter  Medications   predniSONE (DELTASONE) 20 MG tablet    Sig: Take 1 tablet  (20 mg total) by mouth daily with breakfast.    Dispense:  5 tablet    Refill:  0  Call the office if symptoms worsen or persist. Recheck as scheduled and sooner as needed.   No follow-ups on file.  Kennyth Arnold, FNP

## 2021-08-17 ENCOUNTER — Ambulatory Visit
Admission: RE | Admit: 2021-08-17 | Discharge: 2021-08-17 | Disposition: A | Discharge status: Home / Self Care | Payer: 59 | Source: Ambulatory Visit | Attending: Internal Medicine | Admitting: Internal Medicine

## 2021-08-17 DIAGNOSIS — Z13820 Encounter for screening for osteoporosis: Secondary | ICD-10-CM

## 2021-08-23 ENCOUNTER — Encounter: Payer: Self-pay | Admitting: Internal Medicine

## 2021-09-20 ENCOUNTER — Other Ambulatory Visit: Payer: Self-pay | Admitting: *Deleted

## 2021-09-20 MED ORDER — EPINEPHRINE 0.3 MG/0.3ML IJ SOSY: PREFILLED_SYRINGE | INTRAMUSCULAR | 2 refills | Status: AC

## 2021-11-17 ENCOUNTER — Encounter: Payer: Self-pay | Admitting: Family Medicine

## 2021-11-17 ENCOUNTER — Telehealth (INDEPENDENT_AMBULATORY_CARE_PROVIDER_SITE_OTHER): Payer: 59 | Admitting: Family Medicine

## 2021-11-17 VITALS — Temp 100.2°F | Wt 159.0 lb

## 2021-11-17 DIAGNOSIS — R0981 Nasal congestion: Secondary | ICD-10-CM | POA: Diagnosis not present

## 2021-11-17 DIAGNOSIS — R519 Headache, unspecified: Secondary | ICD-10-CM

## 2021-11-17 MED ORDER — DOXYCYCLINE HYCLATE 100 MG PO TABS: 100.0000 mg | ORAL_TABLET | Freq: Two times a day (BID) | ORAL | 0 refills | Status: DC

## 2021-11-17 NOTE — Patient Instructions (Signed)
-  I sent the medication(s) we discussed to your pharmacy: Meds ordered this encounter  Medications   doxycycline (VIBRA-TABS) 100 MG tablet    Sig: Take 1 tablet (100 mg total) by mouth 2 (two) times daily.    Dispense:  20 tablet    Refill:  0     I hope you are feeling better soon!  Seek in person care promptly if your symptoms worsen, new concerns arise or you are not improving with treatment.  It was nice to meet you today. I help [facility] out with telemedicine visits on Tuesdays and Thursdays and am happy to help if you need a virtual follow up visit on those days. Otherwise, if you have any concerns or questions following this visit please schedule a follow up visit with your Primary Care office or seek care at a local urgent care clinic to avoid delays in care. If you are having severe or life threatening symptoms please call 911 and/or go to the nearest emergency room.

## 2021-11-17 NOTE — Progress Notes (Signed)
Virtual Visit via Video Note  I connected with Amber Klein  on 11/17/21 at 11:00 AM EDT by a video enabled telemedicine application and verified that I am speaking with the correct person using two identifiers.  Location patient: [place] Location provider:work or home office Persons participating in the virtual visit: patient, provider  I discussed the limitations and requested verbal permission for telemedicine visit. The patient expressed understanding and agreed to proceed.   HPI:  Acute telemedicine visit for Sinus issues: -Onset:chronic sinus issues and using her flonase and allergy pill (allegra D) but now worsening the last 5 days and feels has a sinus infection -reports gets this several times per year -has done 3 covid tests which were all negative over the last 5 days -Symptoms include: thick nasal congestion, thick PND, sinus pressure and discomfort maxillary, low grade temp 100.2 at times - reports this is typical when she gets a sinus infection -Denies: CP, SOB, NVD -Has tried:flonase, antihistamine, sinus rinse -Pertinent past medical history: see below -Pertinent medication allergies: Allergies  Allergen Reactions   Iodinated Contrast Media Anaphylaxis   Shellfish Allergy Anaphylaxis   Vancomycin Anaphylaxis   Wasp Venom Anaphylaxis   Ciprofloxacin Hives and Swelling    Hives   Sulfa Antibiotics Swelling and Other (See Comments)    Migraine   Zithromax [Azithromycin] Hives   Latex Rash    Sensitivity on too long   Propofol     Couldn't wake patient up - needs lower doses    -COVID-19 vaccine status:  Immunization History  Administered Date(s) Administered   Influenza-Unspecified 01/13/2014, 11/14/2015, 10/14/2016, 10/14/2017, 11/14/2018, 11/06/2020   Moderna Covid-19 Vaccine Bivalent Booster 7yr & up 11/06/2020   PFIZER(Purple Top)SARS-COV-2 Vaccination 03/20/2019, 04/14/2019, 12/13/2019, 07/17/2020   Pneumococcal-Unspecified 02/13/2018   Tdap 05/22/2019   Zoster  Recombinat (Shingrix) 02/14/2019, 03/17/2019     ROS: See pertinent positives and negatives per HPI.  Past Medical History:  Diagnosis Date   Arthritis    Asthma    mild - allergy season induced   [place]    basal cell skin cancer   Complication of anesthesia    Sensitive to propofol - and BP drops   Dysrhythmia    Ear infection    Environmental and seasonal allergies    Femoral hernia of right side with obstruction 01/18/2018   GERD (gastroesophageal reflux disease)    no longer having issues takes TUMS occ.   History of kidney stones    Hypotension    Irritant contact dermatitis associated with stoma 06/25/2020   Pneumonia 02/2016   walking pneumonia    RBBB    Sinus infection     Past Surgical History:  Procedure Laterality Date   CATARACT EXTRACTION     CESAREAN SECTION  1979   15409  CO2 LASER APPLICATION N/A 081/19/1478  Procedure: CO2 LASER APPLICATION;  Surgeon: KVanessa Kick MD;  [facility];  Service: Gynecology;  Laterality: N/A;   COLON SURGERY  04/10/2020   with sepsis and colostomy   COLONOSCOPY     COLONOSCOPY WITH PROPOFOL N/A 11/11/2020   Procedure: COLONOSCOPY WITH PROPOFOL;  Surgeon: JMilus Banister MD;  [facility];  Service: Endoscopy;  Laterality: N/A;  NEEDS PREVISIT WITH ANESTHESIA   FOOT SURGERY Right 2009   INCISIONAL HERNIA REPAIR N/A 04/06/2021   Procedure: HERNIA REPAIR INCISIONAL REPAIR WITH MESH;  Surgeon: RRalene Ok MD;  [facility]  Service: General;  Laterality: N/A;   INGUINAL HERNIA REPAIR Right 01/18/2018  Procedure: OPEN REPAIR INCARCERATED RIGHT FEMORAL HERNIA WITH MESH;  Surgeon: Fanny Skates, MD;  Location: [facility];  Service: General;  Laterality: Right;  GENERAL / TAP BLOCK   INSERTION OF MESH N/A 04/06/2021   Procedure: INSERTION OF MESH;  Surgeon: Ralene Ok, MD;  Location: [facility];  Service: General;  Laterality: N/A;   KNEE SURGERY Left 2016   LAPAROTOMY N/A 04/06/2021   Procedure:  EXPLORATORY LAPAROTOMY;  Surgeon: Ralene Ok, MD;  Location: [facility];  Service: General;  Laterality: N/A;   LYSIS OF ADHESION N/A 04/06/2021   Procedure: LYSIS OF ADHESION;  Surgeon: Ralene Ok, MD;  Location: [facility];  Service: General;  Laterality: N/A;   ORIF ELBOW FRACTURE Left 07/04/2014   Procedure: OPEN REDUCTION INTERNAL FIXATION (ORIF) ELBOW/OLECRANON FRACTURE;  Surgeon: Meredith Pel, MD;  [facility];  Service: Orthopedics;  Laterality: Left;   STOMA DILATATION N/A 12/08/2020   Procedure: STOMA DILATATION WITH STOMAPLASTY;  Surgeon: Johnathan Hausen, MD;  [facility];  Service: General;  Laterality: N/A;   VULVECTOMY N/A 08/10/2016   Procedure: WIDE EXCISION VULVECTOMY;  Surgeon: Vanessa Kick, MD;  Location: [facility];  Service: Gynecology;  Laterality: N/A;   XI ROBOTIC ASSISTED COLOSTOMY TAKEDOWN N/A 01/05/2021   Procedure: ROBOTIC COLOSTOMY REVERSAL WITH LAPAROSCOPIC LYSIS OF ADHESIONS;  Surgeon: Leighton Ruff, MD;  Location[facility];  Service: General;  Laterality: N/A;     Current Outpatient Medications:    albuterol (VENTOLIN HFA) 108 (90 Base) MCG/ACT inhaler, Inhale 2 puffs into the lungs every 6 (six) hours as needed for wheezing or shortness of breath., Disp: 8.5 g, Rfl: 2   doxycycline (VIBRA-TABS) 100 MG tablet, Take 1 tablet (100 mg total) by mouth 2 (two) times daily., Disp: 20 tablet, Rfl: 0   EPINEPHrine 0.3 MG/0.3ML SOSY, Inject 0.3 mLs (0.3 mg total) into the muscle as needed for anaphylaxis, Disp: 1 each, Rfl: 2   fexofenadine-pseudoephedrine (ALLEGRA-D) 60-120 MG 12 hr tablet, Take 1 tablet by mouth daily., Disp: , Rfl:    latanoprost (XALATAN) 0.005 % ophthalmic solution, Place 1 drop into both eyes at bedtime., Disp: , Rfl:    Multiple Vitamin (MULTIVITAMIN WITH MINERALS) TABS tablet, Take 1 tablet by mouth daily., Disp: , Rfl:   EXAM:  VITALS per patient if applicable:reports temp of 100.2  GENERAL: alert, oriented, appears well and in no  acute distress  HEENT: atraumatic, conjunttiva clear, no obvious abnormalities on inspection of external nose and ears, she points to maxillary sinuses as region of pressure/discomfort  NECK: normal movements of the head and neck  LUNGS: on inspection no signs of respiratory distress, breathing rate appears normal, no obvious gross SOB, gasping or wheezing  CV: no obvious cyanosis  MS: moves all visible extremities without noticeable abnormality  PSYCH/NEURO: pleasant and cooperative, no obvious depression or anxiety, speech and thought processing grossly intact  ASSESSMENT AND PLAN:  Discussed the following assessment and plan:  Nasal congestion  Facial discomfort  -we discussed possible serious and likely etiologies, options for evaluation and workup, limitations of telemedicine visit vs in person visit, treatment, treatment risks and precautions. Pt is agreeable to treatment via telemedicine at this moment. Query sinusitis, viral resp illness vs other. She feels is bacterial sinusitis and is requesting abx. Opted for doxy per her preference and prior experience.   Advised to seek prompt virtual visit or in person care if worsening, new symptoms arise, or if is not improving with treatment as expected per our conversation of  expected course. Discussed options for follow up care. Did let this patient know that I do telemedicine on Tuesdays and Thursdays for [facility] and those are the days I am logged into the system. Advised to schedule follow up visit with PCP, [facility] virtual visits or [facility] if any further questions or concerns to avoid delays in care.   I discussed the assessment and treatment plan with the patient. The patient was provided an opportunity to ask questions and all were answered. The patient agreed with the plan and demonstrated an understanding of the instructions.     Lucretia Kern, DO

## 2021-11-23 IMAGING — CT CT ABD-PELV W/O CM
2 of 4 series · 16 of 46 positions shown, 18 images · non-contrast
Comparison: January 14, 2018.

CLINICAL DATA: Acute generalized abdominal pain.

EXAM:
CT ABDOMEN AND PELVIS WITHOUT CONTRAST
TECHNIQUE: Multidetector CT imaging of the abdomen and pelvis was performed
following the standard protocol without IV contrast.

[Series 2: abd pel wo · axial · 0.76mm/px · z∈[+773,+1143]mm · 13 of 82 slices shown, 15 images]
[im 4/82  soft-tissue]
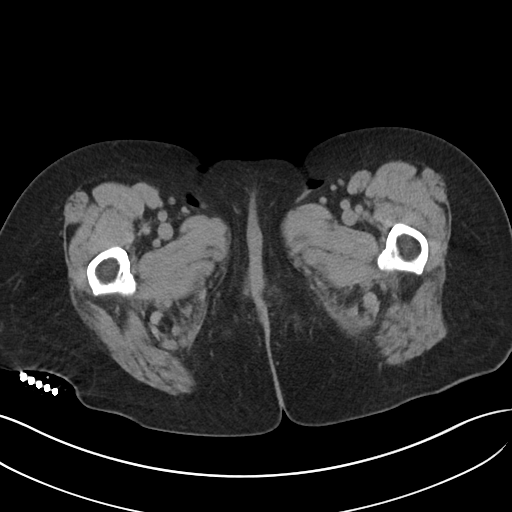
[im 4/82  bone]
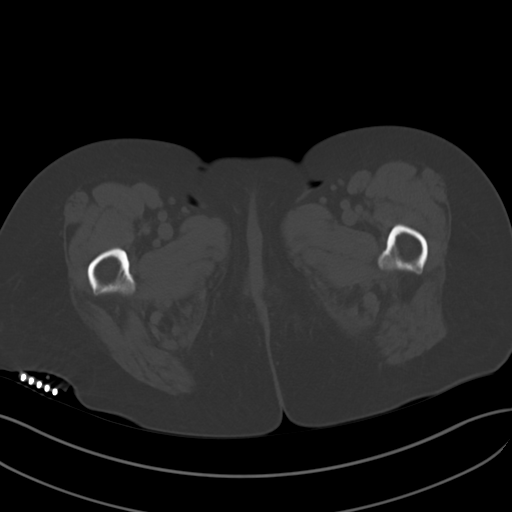
[im 10/82  soft-tissue]
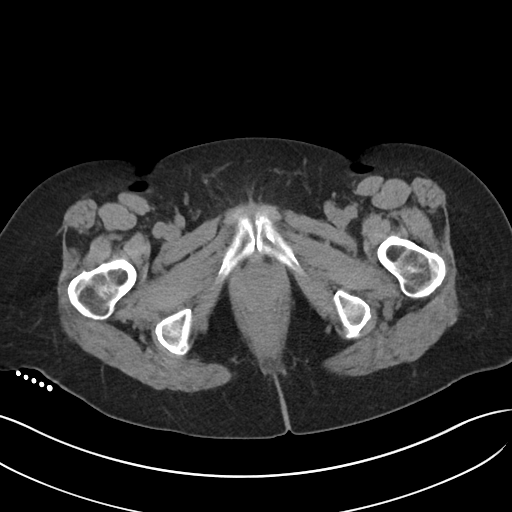
[im 17/82  soft-tissue]
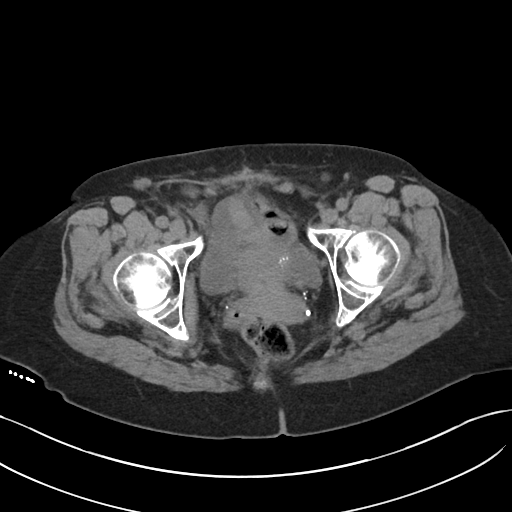
[im 23/82  soft-tissue]
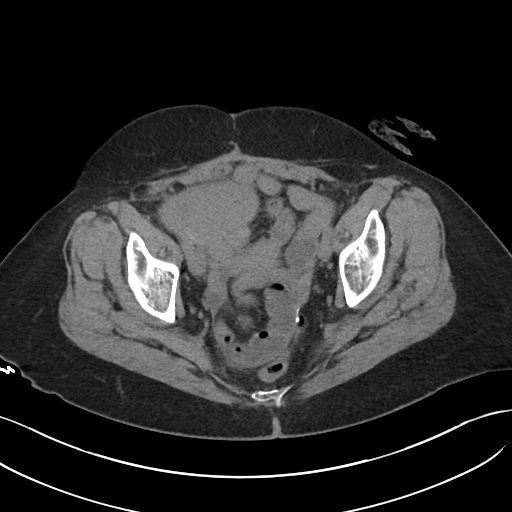
[im 30/82  soft-tissue]
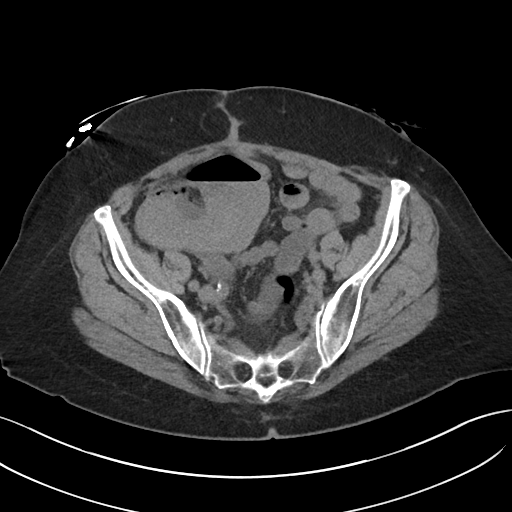
[im 36/82  soft-tissue]
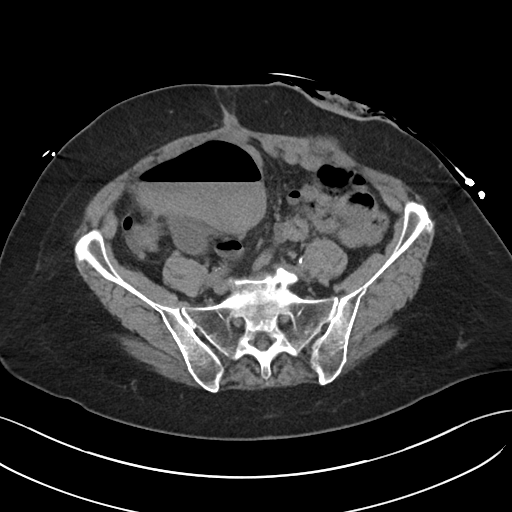
[im 43/82  soft-tissue]
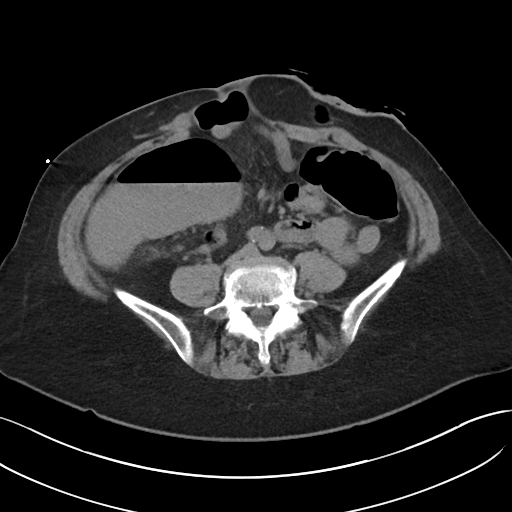
[im 46/82  soft-tissue]
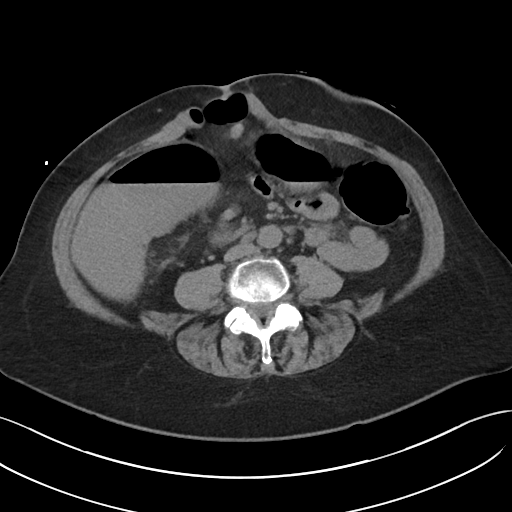
[im 52/82  soft-tissue]
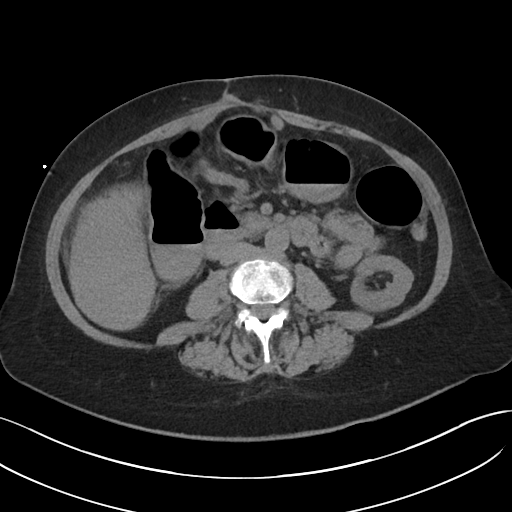
[im 52/82  bone]
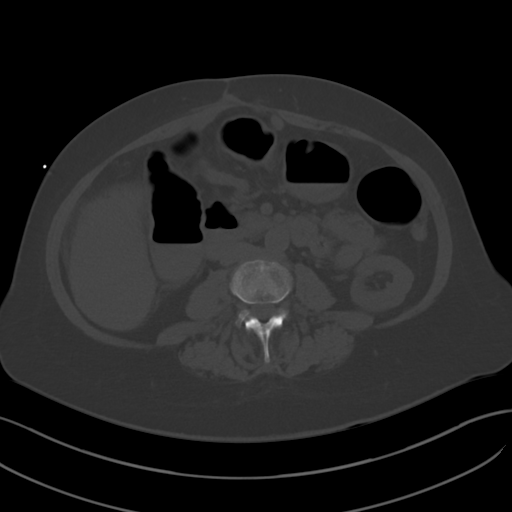
[im 59/82  soft-tissue]
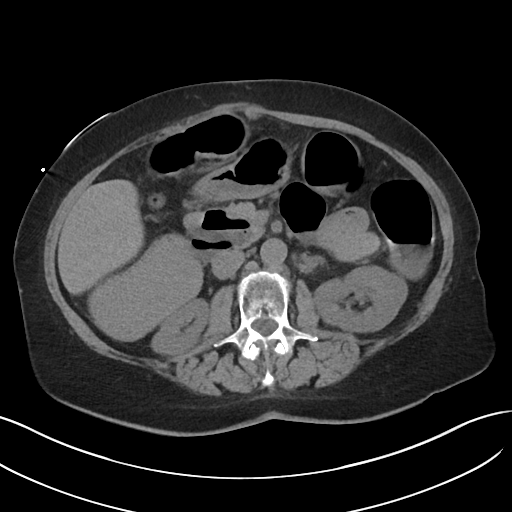
[im 65/82  soft-tissue]
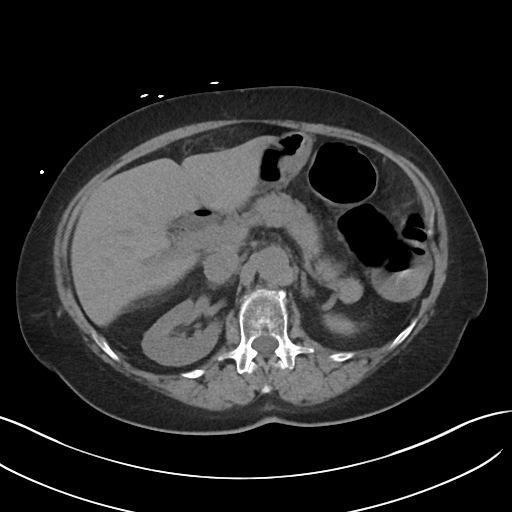
[im 72/82  soft-tissue]
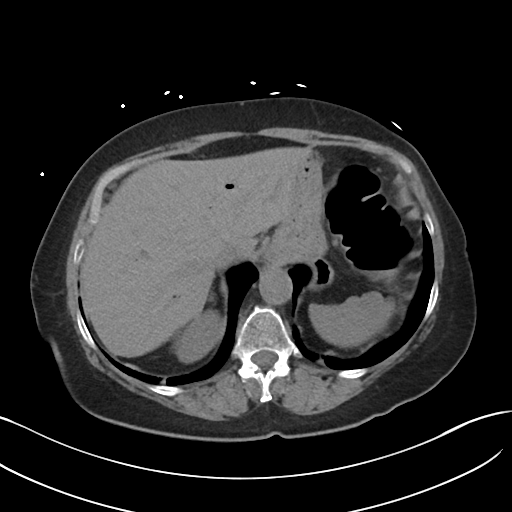
[im 78/82  soft-tissue]
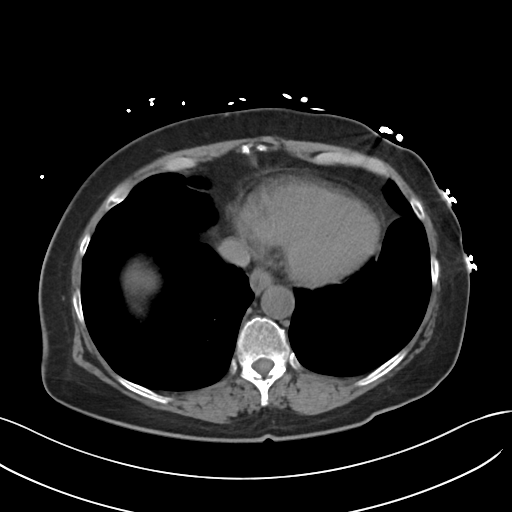

[Series 5: coronal · coronal · 0.67mm/px · 3 of 86 slices shown]
[im 29/86  soft-tissue]
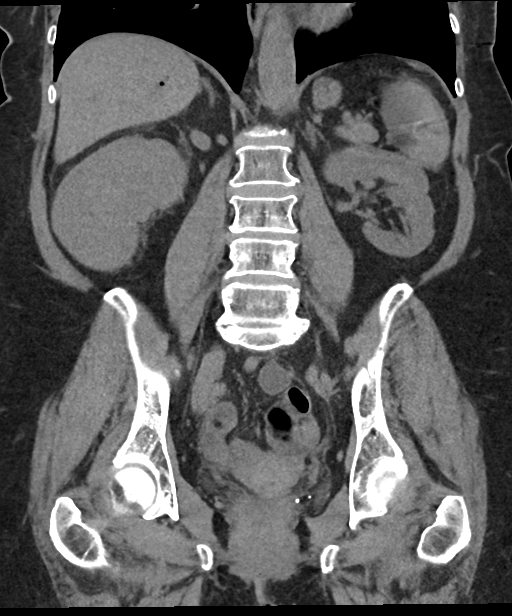
[im 38/86  soft-tissue]
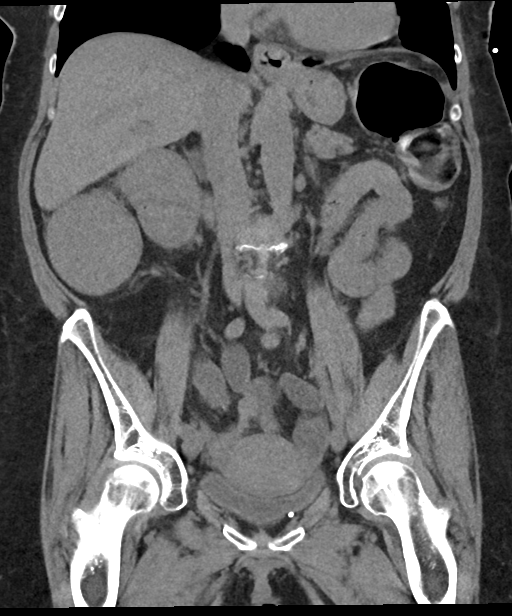
[im 48/86  soft-tissue]
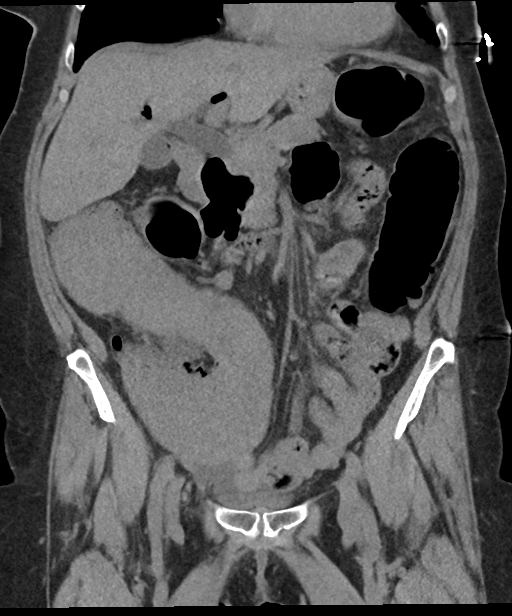

[16 of 46 positions shown; findings below may reference images not displayed]

FINDINGS: Lower chest: No acute abnormality.

Hepatobiliary: No cholelithiasis is noted. Left and right hepatic
pneumobilia is noted, and some degree of pneumobilia was present on
prior exam. However, the possibility of portal venous gas cannot be
excluded as there does appear to be more air compared to prior exam.
No hepatic parenchymal abnormality is noted on these unenhanced
images.

Pancreas: Unremarkable. No pancreatic ductal dilatation or
surrounding inflammatory changes.

Spleen: Normal in size without focal abnormality.

Adrenals/Urinary Tract: Adrenal glands are unremarkable. Kidneys are
normal, without renal calculi, focal lesion, or hydronephrosis.
Bladder is unremarkable.

Stomach/Bowel: The stomach is unremarkable. Large duodenal
diverticulum is noted. No small bowel dilatation is noted. Colostomy
is noted in left lower quadrant. There is moderate to severe
dilatation of the more proximal colon, particularly the cecum. There
appears to be some degree of pneumatosis involving the cecum. The
appendix appears normal.

Vascular/Lymphatic: Aortic atherosclerosis. No enlarged abdominal or
pelvic lymph nodes.

Reproductive: Uterus and bilateral adnexa are unremarkable.

Other: No ascites is noted.  Small periumbilical hernia is noted.

Musculoskeletal: No acute or significant osseous findings.
IMPRESSION: Patient is status post interval colostomy in the left lower
quadrant. There is moderate to severe dilatation of the more
proximal colon, particularly the cecum. No definite transition zone
is noted to suggest obstruction. There is noted possible intramural
gas involving a portion of the cecum. While there was some degree
pneumobilia present on the prior exam, there is an increased amount
of air, including the non dependent portion of the left hepatic
lobe, and the possibility of portal venous gas cannot can not be
excluded. Therefore, the possibility of ischemic bowel cannot be
excluded. Critical Value/emergent results were called by telephone
at the time of interpretation on 12/08/2020 at [DATE] to provider
RHOLAND KER , who verbally acknowledged these results.

Aortic Atherosclerosis ([id number]).

## 2022-01-31 ENCOUNTER — Other Ambulatory Visit: Payer: Self-pay | Admitting: Internal Medicine

## 2022-01-31 DIAGNOSIS — K219 Gastro-esophageal reflux disease without esophagitis: Secondary | ICD-10-CM

## 2022-04-05 ENCOUNTER — Other Ambulatory Visit: Payer: Self-pay

## 2022-04-05 ENCOUNTER — Encounter (HOSPITAL_BASED_OUTPATIENT_CLINIC_OR_DEPARTMENT_OTHER): Payer: Self-pay

## 2022-04-05 ENCOUNTER — Emergency Department (HOSPITAL_BASED_OUTPATIENT_CLINIC_OR_DEPARTMENT_OTHER)
Admission: EM | Admit: 2022-04-05 | Discharge: 2022-04-06 | Disposition: A | Discharge status: Home / Self Care | Payer: 59 | Attending: Emergency Medicine | Admitting: Emergency Medicine

## 2022-04-05 DIAGNOSIS — S61211A Laceration without foreign body of left index finger without damage to nail, initial encounter: Secondary | ICD-10-CM | POA: Insufficient documentation

## 2022-04-05 DIAGNOSIS — W268XXA Contact with other sharp object(s), not elsewhere classified, initial encounter: Secondary | ICD-10-CM | POA: Insufficient documentation

## 2022-04-05 DIAGNOSIS — Y93G1 Activity, food preparation and clean up: Secondary | ICD-10-CM | POA: Diagnosis not present

## 2022-04-05 DIAGNOSIS — S6992XA Unspecified injury of left wrist, hand and finger(s), initial encounter: Secondary | ICD-10-CM | POA: Diagnosis present

## 2022-04-05 NOTE — ED Triage Notes (Signed)
POV from home, GCS 15, A&O x 4, amb to triage.  Pt was making salad and cut left index finger approx 1 in laceration, oozing blood, TDAP UTD.

## 2022-04-06 MED ORDER — LIDOCAINE-EPINEPHRINE (PF) 2 %-1:200000 IJ SOLN
10.0000 mL | Freq: Once | INTRAMUSCULAR | Status: DC
  Filled 2022-04-06: qty 20

## 2022-04-06 MED ORDER — BACITRACIN ZINC 500 UNIT/GM EX OINT
TOPICAL_OINTMENT | Freq: Once | CUTANEOUS | Status: AC
Start: 2022-04-06 — End: 2022-04-06
  Administered 2022-04-06: 1 via TOPICAL

## 2022-04-06 NOTE — ED Notes (Signed)
RN reviewed discharge instructions with pt. Pt verbalized understanding and had no further questions. VSS upon discharge.  

## 2022-04-06 NOTE — ED Provider Notes (Signed)
[facility] Provider Note   CSN: [id number] Arrival date & time: 04/05/22  2018     History  Chief Complaint  Patient presents with   Finger Injury    Amber Klein is a 72 y.o. female.  72 yo F with a chief complaints of finger laceration.  The patient was making a salad today and she cut the tip of her left second digit.  Significant bleeding and so came in for evaluation.        Home Medications Prior to Admission medications   Medication Sig Start Date End Date Taking? Authorizing Provider  albuterol (VENTOLIN HFA) 108 (90 Base) MCG/ACT inhaler INHALE 2 PUFFS INTO THE LUNGS EVERY 6 HOURS AS NEEDED FOR WHEEZING OR SHORTNESS OF BREATH 01/31/22   Isaac Bliss, Rayford Halsted, MD  doxycycline (VIBRA-TABS) 100 MG tablet Take 1 tablet (100 mg total) by mouth 2 (two) times daily. 11/17/21   Lucretia Kern, DO  EPINEPHrine 0.3 MG/0.3ML SOSY Inject 0.3 mLs (0.3 mg total) into the muscle as needed for anaphylaxis 09/20/21   Isaac Bliss, Rayford Halsted, MD  fexofenadine-pseudoephedrine (ALLEGRA-D) 60-120 MG 12 hr tablet Take 1 tablet by mouth daily.    [provider]  latanoprost (XALATAN) 0.005 % ophthalmic solution Place 1 drop into both eyes at bedtime.    [provider]  Multiple Vitamin (MULTIVITAMIN WITH MINERALS) TABS tablet Take 1 tablet by mouth daily.    [provider]      Allergies    Iodinated contrast media, Shellfish allergy, Vancomycin, Wasp venom, Ciprofloxacin, Sulfa antibiotics, Zithromax [azithromycin], Latex, and Propofol    Review of Systems   Review of Systems  Physical Exam Updated Vital Signs BP (!) 141/96   Pulse 81   Temp 97.6 F (36.4 C)   Resp 18   Wt 72 kg   SpO2 99%   BMI 27.23 kg/m  Physical Exam Vitals and nursing note reviewed.  Constitutional:      General: She is not in acute distress.    Appearance: She is well-developed. She is not diaphoretic.  HENT:      Head: Normocephalic and atraumatic.  Eyes:     Pupils: Pupils are equal, round, and reactive to light.  Cardiovascular:     Rate and Rhythm: Normal rate and regular rhythm.     Heart sounds: No murmur heard.    No friction rub. No gallop.  Pulmonary:     Effort: Pulmonary effort is normal.     Breath sounds: No wheezing or rales.  Abdominal:     General: There is no distension.     Palpations: Abdomen is soft.     Tenderness: There is no abdominal tenderness.  Musculoskeletal:        General: No tenderness.     Cervical back: Normal range of motion and neck supple.     Comments: Approximately 3 cm laceration to the pad of the left second digit  Skin:    General: Skin is warm and dry.  Neurological:     Mental Status: She is alert and oriented to person, place, and time.  Psychiatric:        Behavior: Behavior normal.     ED Results / Procedures / Treatments   Labs (all labs ordered are listed, but only abnormal results are displayed) Labs Reviewed - No data to display  EKG None  Radiology No results found.  Procedures .Marland KitchenLaceration Repair  Date/Time: 04/06/2022 12:29 AM  Performed by: Deno Etienne, DO Authorized by: Deno Etienne, DO   Consent:    Consent obtained:  Verbal   Consent given by:  Patient   Risks, benefits, and alternatives were discussed: yes     Risks discussed:  Infection, pain and poor cosmetic result   Alternatives discussed:  No treatment Universal protocol:    Procedure explained and questions answered to patient or proxy's satisfaction: yes     Immediately prior to procedure, a time out was called: yes     Patient identity confirmed:  Verbally with patient Anesthesia:    Anesthesia method:  None Laceration details:    Location:  Finger   Finger location:  L index finger   Length (cm):  2.8 Pre-procedure details:    Preparation:  Patient was prepped and draped in usual sterile fashion Exploration:    Limited defect created (wound  extended): no     Hemostasis achieved with:  Direct pressure   Imaging outcome: foreign body not noted     Wound exploration: wound explored through full range of motion     Wound extent: no foreign body     Contaminated: no   Treatment:    Area cleansed with:  Saline   Amount of cleaning:  Standard   Irrigation solution:  Sterile saline   Irrigation volume:  20   Irrigation method:  Pressure wash   Visualized foreign bodies/material removed: no   Skin repair:    Repair method:  Sutures   Suture size:  4-0   Suture material:  Nylon   Suture technique:  Simple interrupted   Number of sutures:  3 Approximation:    Approximation:  Close Repair type:    Repair type:  Simple Post-procedure details:    Dressing:  Adhesive bandage and antibiotic ointment   Procedure completion:  Tolerated well, no immediate complications     Medications Ordered in ED Medications  lidocaine-EPINEPHrine (XYLOCAINE W/EPI) 2 %-1:200000 (PF) injection 10 mL (10 mLs Intradermal Not Given 04/06/22 0013)  bacitracin ointment (1 Application Topical Given 04/06/22 0013)    ED Course/ Medical Decision Making/ A&P                             Medical Decision Making Risk OTC drugs. Prescription drug management.   21 yoF with a chief complaint of a finger laceration.  This was repaired at bedside.  Discharge home.  12:30 AM:  I have discussed the diagnosis/risks/treatment options with the patient.  Evaluation and diagnostic testing in the emergency department does not suggest an emergent condition requiring admission or immediate intervention beyond what has been performed at this time.  They will follow up with PCP. We also discussed returning to the ED immediately if new or worsening sx occur. We discussed the sx which are most concerning (e.g., sudden worsening pain, fever, inability to tolerate by mouth) that necessitate immediate return. Medications administered to the patient during their visit and any  new prescriptions provided to the patient are listed below.  Medications given during this visit Medications  lidocaine-EPINEPHrine (XYLOCAINE W/EPI) 2 %-1:200000 (PF) injection 10 mL (10 mLs Intradermal Not Given 04/06/22 0013)  bacitracin ointment (1 Application Topical Given 04/06/22 0013)     The patient appears reasonably screen and/or stabilized for discharge and I doubt any other medical condition or other [facility] requiring further screening, evaluation, or treatment in the ED at this time prior to discharge.  Final Clinical Impression(s) / ED Diagnoses Final diagnoses:  Laceration of left index finger without foreign body without damage to nail, initial encounter    Rx / DC Orders ED Discharge Orders     None         Deno Etienne, DO 04/06/22 0030

## 2022-04-06 NOTE — Discharge Instructions (Signed)
Return for redness drainage or if you get a fever.  Dressing finger over the removed sometime between day 7 and 10.  Return here at urgent care if family doctor's office the area can get wet but not fully immersed underwater.  No scrubbing.  If you really want to clean it you can apply a half-and-half hydrogen peroxide solution with water on a Q-tip.  You can apply an ointment a couple times a day this could be as simple as Vaseline but could also be an antibiotic ointment if you wish.

## 2022-04-17 ENCOUNTER — Ambulatory Visit (INDEPENDENT_AMBULATORY_CARE_PROVIDER_SITE_OTHER): Payer: 59 | Admitting: Internal Medicine

## 2022-04-17 ENCOUNTER — Encounter: Payer: Self-pay | Admitting: Internal Medicine

## 2022-04-17 VITALS — BP 132/95 | HR 73 | Temp 98.6°F | Wt 163.8 lb

## 2022-04-17 DIAGNOSIS — Z09 Encounter for follow-up examination after completed treatment for conditions other than malignant neoplasm: Secondary | ICD-10-CM

## 2022-04-17 DIAGNOSIS — Z4802 Encounter for removal of sutures: Secondary | ICD-10-CM | POA: Diagnosis not present

## 2022-04-17 NOTE — Progress Notes (Signed)
Established Patient Office Visit     CC/Reason for Visit: Suture removal, ED follow-up  HPI: Amber Klein is a 72 y.o. female who is coming in today for the above mentioned reasons.  On February 22 she cut the tip of her left index finger with a knife while making a salad.  She went to the emergency department where she had 3 sutures.  She is due for removal.  Sensation remains intact, she has full range of motion.   Past Medical/Surgical History: Past Medical History:  Diagnosis Date   Arthritis    Asthma    mild - allergy season induced   Cancer ([place])    basal cell skin cancer   Complication of anesthesia    Sensitive to propofol - and BP drops   Dysrhythmia    Ear infection    Environmental and seasonal allergies    Femoral hernia of right side with obstruction 01/18/2018   GERD (gastroesophageal reflux disease)    no longer having issues takes TUMS occ.   History of kidney stones    Hypotension    Irritant contact dermatitis associated with stoma 06/25/2020   Pneumonia 02/2016   walking pneumonia    RBBB    Sinus infection     Past Surgical History:  Procedure Laterality Date   CATARACT EXTRACTION     CESAREAN SECTION  1979   [id number]   CO2 LASER APPLICATION N/A [id number]   Procedure: CO2 LASER APPLICATION;  Surgeon: Vanessa Kick, MD;  Location: [facility];  Service: Gynecology;  Laterality: N/A;   COLON SURGERY  04/10/2020   with sepsis and colostomy   COLONOSCOPY     COLONOSCOPY WITH PROPOFOL N/A 11/11/2020   Procedure: COLONOSCOPY WITH PROPOFOL;  Surgeon: Milus Banister, MD;  Location: [facility];  Service: Endoscopy;  Laterality: N/A;  NEEDS PREVISIT WITH ANESTHESIA   FOOT SURGERY Right 2009   INCISIONAL HERNIA REPAIR N/A 04/06/2021   Procedure: HERNIA REPAIR INCISIONAL REPAIR WITH MESH;  Surgeon: Ralene Ok, MD;  Location: [facility];  Service: General;  Laterality: N/A;   INGUINAL HERNIA REPAIR Right 01/18/2018   Procedure: OPEN REPAIR  INCARCERATED RIGHT FEMORAL HERNIA WITH MESH;  Surgeon: Fanny Skates, MD;  Location: [facility];  Service: General;  Laterality: Right;  GENERAL / TAP BLOCK   INSERTION OF MESH N/A 04/06/2021   Procedure: INSERTION OF MESH;  Surgeon: Ralene Ok, MD;  Location: [facility];  Service: General;  Laterality: N/A;   KNEE SURGERY Left 2016   LAPAROTOMY N/A 04/06/2021   Procedure: EXPLORATORY LAPAROTOMY;  Surgeon: Ralene Ok, MD;  Location: [facility];  Service: General;  Laterality: N/A;   LYSIS OF ADHESION N/A 04/06/2021   Procedure: LYSIS OF ADHESION;  Surgeon: Ralene Ok, MD;  Location: [facility];  Service: General;  Laterality: N/A;   ORIF ELBOW FRACTURE Left 07/04/2014   Procedure: OPEN REDUCTION INTERNAL FIXATION (ORIF) ELBOW/OLECRANON FRACTURE;  Surgeon: Meredith Pel, MD;  [facility];  Service: Orthopedics;  Laterality: Left;   STOMA DILATATION N/A 12/08/2020   Procedure: STOMA DILATATION WITH STOMAPLASTY;  Surgeon: Johnathan Hausen, MD;  [facility];  Service: General;  Laterality: N/A;   VULVECTOMY N/A 08/10/2016   Procedure: WIDE EXCISION VULVECTOMY;  Surgeon: Vanessa Kick, MD;  Location: [facility];  Service: Gynecology;  Laterality: N/A;   XI ROBOTIC ASSISTED COLOSTOMY TAKEDOWN N/A 01/05/2021   Procedure: ROBOTIC COLOSTOMY REVERSAL WITH LAPAROSCOPIC LYSIS OF ADHESIONS;  Surgeon: Leighton Ruff, MD;  Location: [facility];  Service: General;  Laterality: N/A;    Social History:  reports that she quit smoking about 7 years ago. Her smoking use included cigarettes. She has a 4.00 pack-year smoking history. She has never used smokeless tobacco. She reports current alcohol use. She reports that she does not use drugs.  Allergies: Allergies  Allergen Reactions   Iodinated Contrast Media Anaphylaxis   Shellfish Allergy Anaphylaxis   Vancomycin Anaphylaxis   Wasp Venom Anaphylaxis   Ciprofloxacin Hives and Swelling    Hives   Sulfa Antibiotics Swelling and Other (See Comments)     Migraine   Zithromax [Azithromycin] Hives   Latex Rash    Sensitivity on too long   Propofol     Couldn't wake patient up - needs lower doses     Family History:  Family History  Problem Relation Age of Onset   Dementia Mother    Kidney cancer Father    Breast cancer Maternal Grandmother    Colon cancer Neg Hx    Esophageal cancer Neg Hx    Pancreatic cancer Neg Hx    Stomach cancer Neg Hx      Current Outpatient Medications:    albuterol (VENTOLIN HFA) 108 (90 Base) MCG/ACT inhaler, INHALE 2 PUFFS INTO THE LUNGS EVERY 6 HOURS AS NEEDED FOR WHEEZING OR SHORTNESS OF BREATH, Disp: 8.5 g, Rfl: 2   EPINEPHrine 0.3 MG/0.3ML SOSY, Inject 0.3 mLs (0.3 mg total) into the muscle as needed for anaphylaxis, Disp: 1 each, Rfl: 2   fexofenadine-pseudoephedrine (ALLEGRA-D) 60-120 MG 12 hr tablet, Take 1 tablet by mouth daily., Disp: , Rfl:    latanoprost (XALATAN) 0.005 % ophthalmic solution, Place 1 drop into both eyes at bedtime., Disp: , Rfl:    Multiple Vitamin (MULTIVITAMIN WITH MINERALS) TABS tablet, Take 1 tablet by mouth daily., Disp: , Rfl:   Review of Systems:  Negative unless indicated in HPI.   Physical Exam: Vitals:   04/17/22 1137 04/17/22 1142  BP: (!) 140/90 (!) 132/95  Pulse: 73   Temp: 98.6 F (37 C)   TempSrc: Oral   SpO2: 92%   Weight: 163 lb 12.8 oz (74.3 kg)     Body mass index is 28.1 kg/m.   Physical Exam Vitals reviewed.  Constitutional:      Appearance: Normal appearance.  HENT:     Head: Normocephalic and atraumatic.  Skin:    General: Skin is warm and dry.  Neurological:     General: No focal deficit present.     Mental Status: She is alert and oriented to person, place, and time.  Psychiatric:        Mood and Affect: Mood normal.        Behavior: Behavior normal.        Thought Content: Thought content normal.        Judgment: Judgment normal.      Impression and Plan:  Hospital discharge follow-up  Visit for suture removal  -ED  charts reviewed in detail. -3 sutures removed from the distal phalanx of her left index finger.  No complications apparent.  Time spent:22 minutes reviewing chart, interviewing and examining patient and formulating plan of care.     Lelon Frohlich, MD [facility]

## 2022-04-20 ENCOUNTER — Telehealth: Payer: 59 | Admitting: Physician Assistant

## 2022-04-20 DIAGNOSIS — J019 Acute sinusitis, unspecified: Secondary | ICD-10-CM | POA: Diagnosis not present

## 2022-04-20 DIAGNOSIS — B9689 Other specified bacterial agents as the cause of diseases classified elsewhere: Secondary | ICD-10-CM

## 2022-04-20 MED ORDER — ALBUTEROL SULFATE HFA 108 (90 BASE) MCG/ACT IN AERS
2.0000 | INHALATION_SPRAY | Freq: Four times a day (QID) | RESPIRATORY_TRACT | 2 refills | Status: DC | PRN
Start: 2022-04-20 — End: 2023-04-11

## 2022-04-20 MED ORDER — DOXYCYCLINE HYCLATE 100 MG PO TABS: 100.0000 mg | ORAL_TABLET | Freq: Two times a day (BID) | ORAL | 0 refills | Status: DC

## 2022-04-20 NOTE — Patient Instructions (Signed)
Drue Second, thank you for joining Leeanne Rio, PA-C for today's virtual visit.  While this provider is not your primary care provider (PCP), if your PCP is located in our provider database this encounter information will be shared with them immediately following your visit.   [facility] account gives you access to today's visit and all your visits, tests, and labs performed at [facility] " click here if you don't have a [facility] account or go to [email or web address]  Consent: (Patient) Amber Klein provided verbal consent for this virtual visit at the beginning of the encounter.  Current Medications:  Current Outpatient Medications:    albuterol (VENTOLIN HFA) 108 (90 Base) MCG/ACT inhaler, Inhale 2 puffs into the lungs every 6 (six) hours as needed for wheezing or shortness of breath., Disp: 8.5 g, Rfl: 2   doxycycline (VIBRA-TABS) 100 MG tablet, Take 1 tablet (100 mg total) by mouth 2 (two) times daily., Disp: 20 tablet, Rfl: 0   EPINEPHrine 0.3 MG/0.3ML SOSY, Inject 0.3 mLs (0.3 mg total) into the muscle as needed for anaphylaxis, Disp: 1 each, Rfl: 2   fexofenadine-pseudoephedrine (ALLEGRA-D) 60-120 MG 12 hr tablet, Take 1 tablet by mouth daily., Disp: , Rfl:    latanoprost (XALATAN) 0.005 % ophthalmic solution, Place 1 drop into both eyes at bedtime., Disp: , Rfl:    Multiple Vitamin (MULTIVITAMIN WITH MINERALS) TABS tablet, Take 1 tablet by mouth daily., Disp: , Rfl:    Medications ordered in this encounter:  Meds ordered this encounter  Medications   doxycycline (VIBRA-TABS) 100 MG tablet    Sig: Take 1 tablet (100 mg total) by mouth 2 (two) times daily.    Dispense:  20 tablet    Refill:  0    Order Specific Question:   Supervising Provider    Answer:   Bari Mantis   albuterol (VENTOLIN HFA) 108 (90 Base) MCG/ACT inhaler    Sig: Inhale 2 puffs into the lungs every 6 (six) hours as needed for  wheezing or shortness of breath.    Dispense:  8.5 g    Refill:  2    Order Specific Question:   Supervising Provider    Answer:   Chase Picket [id number]     *If you need refills on other medications prior to your next appointment, please contact your pharmacy*  Follow-Up: Call back or seek an in-person evaluation if the symptoms worsen or if the condition fails to improve as anticipated.  [facility] [phone]  Other Instructions Please take antibiotic as directed.  Increase fluid intake.  Use Saline nasal spray.  Take a daily multivitamin. I have refilled your albuterol as well.  Place a humidifier in the bedroom.  Please call or return clinic if symptoms are not improving.  Sinusitis Sinusitis is redness, soreness, and swelling (inflammation) of the paranasal sinuses. Paranasal sinuses are air pockets within the bones of your face (beneath the eyes, the middle of the forehead, or above the eyes). In healthy paranasal sinuses, mucus is able to drain out, and air is able to circulate through them by way of your nose. However, when your paranasal sinuses are inflamed, mucus and air can become trapped. This can allow bacteria and other germs to grow and cause infection. Sinusitis can develop quickly and last only a short time (acute) or continue over a long period (chronic). Sinusitis that lasts for more than 12 weeks is considered chronic.  CAUSES  Causes of sinusitis include: Allergies. Structural abnormalities, such as displacement of the cartilage that separates your nostrils (deviated septum), which can decrease the air flow through your nose and sinuses and affect sinus drainage. Functional abnormalities, such as when the small hairs (cilia) that line your sinuses and help remove mucus do not work properly or are not present. SYMPTOMS  Symptoms of acute and chronic sinusitis are the same. The primary symptoms are pain and pressure around the affected sinuses.  Other symptoms include: Upper toothache. Earache. Headache. Bad breath. Decreased sense of smell and taste. A cough, which worsens when you are lying flat. Fatigue. Fever. Thick drainage from your nose, which often is green and may contain pus (purulent). Swelling and warmth over the affected sinuses. DIAGNOSIS  Your caregiver will perform a physical exam. During the exam, your caregiver may: Look in your nose for signs of abnormal growths in your nostrils (nasal polyps). Tap over the affected sinus to check for signs of infection. View the inside of your sinuses (endoscopy) with a special imaging device with a light attached (endoscope), which is inserted into your sinuses. If your caregiver suspects that you have chronic sinusitis, one or more of the following tests may be recommended: Allergy tests. Nasal culture A sample of mucus is taken from your nose and sent to a lab and screened for bacteria. Nasal cytology A sample of mucus is taken from your nose and examined by your caregiver to determine if your sinusitis is related to an allergy. TREATMENT  Most cases of acute sinusitis are related to a viral infection and will resolve on their own within 10 days. Sometimes medicines are prescribed to help relieve symptoms (pain medicine, decongestants, nasal steroid sprays, or saline sprays).  However, for sinusitis related to a bacterial infection, your caregiver will prescribe antibiotic medicines. These are medicines that will help kill the bacteria causing the infection.  Rarely, sinusitis is caused by a fungal infection. In theses cases, your caregiver will prescribe antifungal medicine. For some cases of chronic sinusitis, surgery is needed. Generally, these are cases in which sinusitis recurs more than 3 times per year, despite other treatments. HOME CARE INSTRUCTIONS  Drink plenty of water. Water helps thin the mucus so your sinuses can drain more easily. Use a humidifier. Inhale  steam 3 to 4 times a day (for example, sit in the bathroom with the shower running). Apply a warm, moist washcloth to your face 3 to 4 times a day, or as directed by your caregiver. Use saline nasal sprays to help moisten and clean your sinuses. Take over-the-counter or prescription medicines for pain, discomfort, or fever only as directed by your caregiver. SEEK IMMEDIATE MEDICAL CARE IF: You have increasing pain or severe headaches. You have nausea, vomiting, or drowsiness. You have swelling around your face. You have vision problems. You have a stiff neck. You have difficulty breathing. MAKE SURE YOU:  Understand these instructions. Will watch your condition. Will get help right away if you are not doing well or get worse. Document Released: 01/30/2005 Document Revised: 04/24/2011 Document Reviewed: 02/14/2011 [facility] Patient Information 2014 [place], [place].    If you have been instructed to have an in-person evaluation today at a local Urgent Care facility, please use the link below. It will take you to a list of all of our available [facility], including address, phone number and hours of operation. Please do not delay care.  [facility]  If you or a family  member do not have a primary care provider, use the link below to schedule a visit and establish care. When you choose a [facility] primary care physician or advanced practice provider, you gain a long-term partner in health. Find a Primary Care Provider  Learn more about [facility]'s in-office and virtual care options: [facility]

## 2022-04-20 NOTE — Progress Notes (Signed)
Virtual Visit Consent   Avalei Beld Golomb, you are scheduled for a virtual visit with a [facility] provider today. Just as with appointments in the office, your consent must be obtained to participate. Your consent will be active for this visit and any virtual visit you may have with one of our providers in the next 365 days. If you have a MyChart account, a copy of this consent can be sent to you electronically.  As this is a virtual visit, video technology does not allow for your provider to perform a traditional examination. This may limit your provider's ability to fully assess your condition. If your provider identifies any concerns that need to be evaluated in person or the need to arrange testing (such as labs, EKG, etc.), we will make arrangements to do so. Although advances in technology are sophisticated, we cannot ensure that it will always work on either your end or our end. If the connection with a video visit is poor, the visit may have to be switched to a telephone visit. With either a video or telephone visit, we are not always able to ensure that we have a secure connection.  By engaging in this virtual visit, you consent to the provision of healthcare and authorize for your insurance to be billed (if applicable) for the services provided during this visit. Depending on your insurance coverage, you may receive a charge related to this service.  I need to obtain your verbal consent now. Are you willing to proceed with your visit today? Amber Klein has provided verbal consent on 04/20/2022 for a virtual visit (video or telephone). Leeanne Rio, [place]  Date: 04/20/2022 9:01 AM  Virtual Visit via Video Note   I, Leeanne Rio, connected with  Amber Klein  ([id number], [birth year]) on 04/20/22 at  8:45 AM EST by a video-enabled telemedicine application and verified that I am speaking with the correct person using two identifiers.  Location: Patient:  Virtual Visit Location Patient: Home Provider: Virtual Visit Location Provider: Home Office   I discussed the limitations of evaluation and management by telemedicine and the availability of in person appointments. The patient expressed understanding and agreed to proceed.    History of Present Illness: Amber Klein is a 72 y.o. who identifies as a female who was assigned female at birth, and is being seen today for > 1 week of nasal and head congestion with sinus pressure and now sinus pain. Intermittent lw-grade fever. Denies substantial chest congestion. Denies chest pain or SOB. Is out of her albuterol inhaler and would like refills. Has tested for COVID x 2 and both negative. Notes this feels identical to prior sinus infections she has had.   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   Colonic obstruction ([place]) 12/08/2020   Diverticulosis of colon 10/08/2020   Intractable vomiting 07/06/2020   Hypoxia 07/06/2020   Irritant contact dermatitis associated with stoma 06/25/2020   Diverticulitis 05/21/2020   GERD (gastroesophageal reflux disease)    Acute suppurative peritonitis ([place]) 04/11/2020   Perforated sigmoid colon ([place]) 04/10/2020   Other constipation 04/06/2020   Femoral hernia of right side with obstruction 01/18/2018   Elbow fracture 07/04/2014    Allergies:  Allergies  Allergen Reactions   Iodinated Contrast Media Anaphylaxis   Shellfish Allergy Anaphylaxis   Vancomycin Anaphylaxis   Wasp Venom Anaphylaxis   Ciprofloxacin  Hives and Swelling    Hives   Sulfa Antibiotics Swelling and Other (See Comments)    Migraine   Zithromax [Azithromycin] Hives   Latex Rash    Sensitivity on too long   Propofol     Couldn't wake patient up - needs lower doses    Medications:  Current Outpatient Medications:    albuterol (VENTOLIN HFA) 108 (90 Base) MCG/ACT inhaler,  Inhale 2 puffs into the lungs every 6 (six) hours as needed for wheezing or shortness of breath., Disp: 8.5 g, Rfl: 2   doxycycline (VIBRA-TABS) 100 MG tablet, Take 1 tablet (100 mg total) by mouth 2 (two) times daily., Disp: 20 tablet, Rfl: 0   EPINEPHrine 0.3 MG/0.3ML SOSY, Inject 0.3 mLs (0.3 mg total) into the muscle as needed for anaphylaxis, Disp: 1 each, Rfl: 2   fexofenadine-pseudoephedrine (ALLEGRA-D) 60-120 MG 12 hr tablet, Take 1 tablet by mouth daily., Disp: , Rfl:    latanoprost (XALATAN) 0.005 % ophthalmic solution, Place 1 drop into both eyes at bedtime., Disp: , Rfl:    Multiple Vitamin (MULTIVITAMIN WITH MINERALS) TABS tablet, Take 1 tablet by mouth daily., Disp: , Rfl:   Observations/Objective: Patient is well-developed, well-nourished in no acute distress.  Resting comfortably at home.  Head is normocephalic, atraumatic.  No labored breathing.  Speech is clear and coherent with logical content.  Patient is alert and oriented at baseline.    Assessment and Plan: 1. Acute bacterial sinusitis - doxycycline (VIBRA-TABS) 100 MG tablet; Take 1 tablet (100 mg total) by mouth 2 (two) times daily.  Dispense: 20 tablet; Refill: 0 - albuterol (VENTOLIN HFA) 108 (90 Base) MCG/ACT inhaler; Inhale 2 puffs into the lungs every 6 (six) hours as needed for wheezing or shortness of breath.  Dispense: 8.5 g; Refill: 2  Rx Doxycycline.  Increase fluids.  Rest.  Saline nasal spray.  Probiotic.  Mucinex as directed.  Humidifier in bedroom. Albuterol refilled.  Call or return to clinic if symptoms are not improving.   Follow Up Instructions: I discussed the assessment and treatment plan with the patient. The patient was provided an opportunity to ask questions and all were answered. The patient agreed with the plan and demonstrated an understanding of the instructions.  A copy of instructions were sent to the patient via MyChart unless otherwise noted below.   The patient was advised to call  back or seek an in-person evaluation if the symptoms worsen or if the condition fails to improve as anticipated.  Time:  I spent 10 minutes with the patient via telehealth technology discussing the above problems/concerns.    Leeanne Rio, PA-C

## 2022-06-03 ENCOUNTER — Emergency Department (HOSPITAL_COMMUNITY): Payer: 59

## 2022-06-03 ENCOUNTER — Emergency Department (HOSPITAL_BASED_OUTPATIENT_CLINIC_OR_DEPARTMENT_OTHER)
Admission: EM | Admit: 2022-06-03 | Discharge: 2022-06-03 | Disposition: A | Discharge status: Home / Self Care | Payer: 59 | Attending: Emergency Medicine | Admitting: Emergency Medicine

## 2022-06-03 ENCOUNTER — Encounter (HOSPITAL_BASED_OUTPATIENT_CLINIC_OR_DEPARTMENT_OTHER): Payer: Self-pay | Admitting: Emergency Medicine

## 2022-06-03 ENCOUNTER — Other Ambulatory Visit: Payer: Self-pay

## 2022-06-03 ENCOUNTER — Emergency Department (HOSPITAL_BASED_OUTPATIENT_CLINIC_OR_DEPARTMENT_OTHER): Payer: 59

## 2022-06-03 DIAGNOSIS — Z85828 Personal history of other malignant neoplasm of skin: Secondary | ICD-10-CM | POA: Insufficient documentation

## 2022-06-03 DIAGNOSIS — S0990XA Unspecified injury of head, initial encounter: Secondary | ICD-10-CM

## 2022-06-03 DIAGNOSIS — W19XXXA Unspecified fall, initial encounter: Secondary | ICD-10-CM

## 2022-06-03 DIAGNOSIS — M542 Cervicalgia: Secondary | ICD-10-CM | POA: Diagnosis present

## 2022-06-03 DIAGNOSIS — W07XXXA Fall from chair, initial encounter: Secondary | ICD-10-CM | POA: Diagnosis not present

## 2022-06-03 DIAGNOSIS — S129XXA Fracture of neck, unspecified, initial encounter: Secondary | ICD-10-CM

## 2022-06-03 DIAGNOSIS — J45909 Unspecified asthma, uncomplicated: Secondary | ICD-10-CM | POA: Diagnosis not present

## 2022-06-03 MED ORDER — ALPRAZOLAM 0.25 MG PO TABS
1.0000 mg | ORAL_TABLET | Freq: Once | ORAL | Status: AC | PRN
  Administered 2022-06-03: 1 mg via ORAL
  Filled 2022-06-03: qty 4

## 2022-06-03 NOTE — ED Provider Notes (Signed)
[place] EMERGENCY DEPARTMENT AT [facility] Provider Note   CSN: [id number] Arrival date & time: 06/03/22  1553     History {Add pertinent medical, surgical, social history, OB history to HPI:1} Chief Complaint  Patient presents with   Fall   Neck Pain    Amber Klein is a 72 y.o. female with *** presents with fall, neck pain.  Patient presnets to [place] as transfer from *** for neck fracture.    Fall  Neck Pain      Home Medications Prior to Admission medications   Medication Sig Start Date End Date Taking? Authorizing Provider  albuterol (VENTOLIN HFA) 108 (90 Base) MCG/ACT inhaler Inhale 2 puffs into the lungs every 6 (six) hours as needed for wheezing or shortness of breath. 04/20/22   Waldon Merl, PA-C  doxycycline (VIBRA-TABS) 100 MG tablet Take 1 tablet (100 mg total) by mouth 2 (two) times daily. 04/20/22   Waldon Merl, PA-C  EPINEPHrine 0.3 MG/0.3ML SOSY Inject 0.3 mLs (0.3 mg total) into the muscle as needed for anaphylaxis 09/20/21   Philip Aspen, Limmie Patricia, MD  fexofenadine-pseudoephedrine (ALLEGRA-D) 60-120 MG 12 hr tablet Take 1 tablet by mouth daily.    [provider]  latanoprost (XALATAN) 0.005 % ophthalmic solution Place 1 drop into both eyes at bedtime.    [provider]  Multiple Vitamin (MULTIVITAMIN WITH MINERALS) TABS tablet Take 1 tablet by mouth daily.    [provider]      Allergies    Iodinated contrast media, Shellfish allergy, Vancomycin, Wasp venom, Ciprofloxacin, Sulfa antibiotics, Zithromax [azithromycin], Latex, and Propofol    Review of Systems   Review of Systems  Musculoskeletal:  Positive for neck pain.   Review of systems {pos/neg:18640::"Negative","Positive"} for ***.  A 10 point review of systems was performed and is negative unless otherwise reported in HPI.  Physical Exam Updated Vital Signs BP 130/79 (BP Location: Right Arm)   Pulse 88   Temp 98.4 F (36.9  C)   Resp 18   Ht 5\' 4"  (1.626 m)   Wt 71.7 kg   SpO2 95%   BMI 27.12 kg/m  Physical Exam General: Normal appearing {Desc; female/female:11659}, lying in bed.  HEENT: PERRLA, Sclera anicteric, MMM, trachea midline.  Cardiology: RRR, no murmurs/rubs/gallops. BL radial and DP pulses equal bilaterally.  Resp: Normal respiratory rate and effort. CTAB, no wheezes, rhonchi, crackles.  Abd: Soft, non-tender, non-distended. No rebound tenderness or guarding.  GU: Deferred. MSK: No peripheral edema or signs of trauma. Extremities without deformity or TTP. No cyanosis or clubbing. Skin: warm, dry. No rashes or lesions. Back: No CVA tenderness Neuro: A&Ox4, CNs II-XII grossly intact. MAEs. Sensation grossly intact.  Psych: Normal mood and affect.   ED Results / Procedures / Treatments   Labs (all labs ordered are listed, but only abnormal results are displayed) Labs Reviewed - No data to display  EKG None  Radiology CT Head Wo Contrast  Result Date: 06/03/2022 CLINICAL DATA:  Trauma EXAM: CT HEAD WITHOUT CONTRAST CT CERVICAL SPINE WITHOUT CONTRAST TECHNIQUE: Multidetector CT imaging of the head and cervical spine was performed following the standard protocol without intravenous contrast. Multiplanar CT image reconstructions of the cervical spine were also generated. RADIATION DOSE REDUCTION: This exam was performed according to the departmental dose-optimization program which includes automated exposure control, adjustment of the mA and/or kV according to patient size and/or use of iterative reconstruction technique. COMPARISON:  None Available. FINDINGS: CT HEAD FINDINGS  Brain: No evidence of acute infarction, hemorrhage, hydrocephalus, extra-axial collection or mass lesion/mass effect. Sequela of mild chronic microvascular ischemic change. Vascular: No hyperdense vessel or unexpected calcification. Skull: Mild soft tissue swelling along the right parietal scalp. Negative for fracture or focal  lesion. Sinuses/Orbits: No middle ear or mastoid effusion. Paranasal sinuses are clear. Orbits are unremarkable Other: None. CT CERVICAL SPINE FINDINGS Alignment: Straightening of the normal cervical lordosis. Trace anterolisthesis of C7 on T1. Skull base and vertebrae: No worrisome osseous lesions. Acute fracture through the anterior superior endplate of T1 (series 8, image 32). Soft tissues and spinal canal: No prevertebral fluid or swelling. No visible canal hematoma. Disc levels:  At least moderate spinal canal stenosis at C5-C6. Upper chest: Negative. Other: None IMPRESSION: CT HEAD: 1. No acute intracranial abnormality. 2. Mild soft tissue swelling along the right parietal scalp. CT CERVICAL SPINE: 1. Acute fracture through the anterior superior endplate of T1. Recommend further evaluation with a cervical spine MRI to exclude the possibility of ligamentous injury. 2. Likely moderate spinal canal stenosis at C5-C6. Electronically Signed   By: Lorenza Cambridge M.D.   On: 06/03/2022 18:32   CT Cervical Spine Wo Contrast  Result Date: 06/03/2022 CLINICAL DATA:  Trauma EXAM: CT HEAD WITHOUT CONTRAST CT CERVICAL SPINE WITHOUT CONTRAST TECHNIQUE: Multidetector CT imaging of the head and cervical spine was performed following the standard protocol without intravenous contrast. Multiplanar CT image reconstructions of the cervical spine were also generated. RADIATION DOSE REDUCTION: This exam was performed according to the departmental dose-optimization program which includes automated exposure control, adjustment of the mA and/or kV according to patient size and/or use of iterative reconstruction technique. COMPARISON:  None Available. FINDINGS: CT HEAD FINDINGS Brain: No evidence of acute infarction, hemorrhage, hydrocephalus, extra-axial collection or mass lesion/mass effect. Sequela of mild chronic microvascular ischemic change. Vascular: No hyperdense vessel or unexpected calcification. Skull: Mild soft tissue  swelling along the right parietal scalp. Negative for fracture or focal lesion. Sinuses/Orbits: No middle ear or mastoid effusion. Paranasal sinuses are clear. Orbits are unremarkable Other: None. CT CERVICAL SPINE FINDINGS Alignment: Straightening of the normal cervical lordosis. Trace anterolisthesis of C7 on T1. Skull base and vertebrae: No worrisome osseous lesions. Acute fracture through the anterior superior endplate of T1 (series 8, image 32). Soft tissues and spinal canal: No prevertebral fluid or swelling. No visible canal hematoma. Disc levels:  At least moderate spinal canal stenosis at C5-C6. Upper chest: Negative. Other: None IMPRESSION: CT HEAD: 1. No acute intracranial abnormality. 2. Mild soft tissue swelling along the right parietal scalp. CT CERVICAL SPINE: 1. Acute fracture through the anterior superior endplate of T1. Recommend further evaluation with a cervical spine MRI to exclude the possibility of ligamentous injury. 2. Likely moderate spinal canal stenosis at C5-C6. Electronically Signed   By: Lorenza Cambridge M.D.   On: 06/03/2022 18:32    Procedures Procedures  {Document cardiac monitor, telemetry assessment procedure when appropriate:1}  Medications Ordered in ED Medications  ALPRAZolam Prudy Feeler) tablet 1 mg (has no administration in time range)    ED Course/ Medical Decision Making/ A&P                          Medical Decision Making Amount and/or Complexity of Data Reviewed Radiology: ordered.  Risk Prescription drug management.    This patient presents to the ED for concern of ***, this involves an extensive number of treatment options, and is a complaint that carries  with it a high risk of complications and morbidity.  I considered the following differential and admission for this acute, potentially life threatening condition.   MDM:    ***     Labs: I Ordered, and personally interpreted labs.  The pertinent results include:  ***  Imaging Studies  ordered: I ordered imaging studies including *** I independently visualized and interpreted imaging. I agree with the radiologist interpretation  Additional history obtained from ***.  External records from outside source obtained and reviewed including ***  Cardiac Monitoring: The patient was maintained on a cardiac monitor.  I personally viewed and interpreted the cardiac monitored which showed an underlying rhythm of: ***  Reevaluation: After the interventions noted above, I reevaluated the patient and found that they have :{resolved/improved/worsened:23923::"improved"}  Social Determinants of Health: ***  Disposition:  ***  Co morbidities that complicate the patient evaluation  Past Medical History:  Diagnosis Date   Arthritis    Asthma    mild - allergy season induced   Cancer    basal cell skin cancer   Complication of anesthesia    Sensitive to propofol - and BP drops   Dysrhythmia    Ear infection    Environmental and seasonal allergies    Femoral hernia of right side with obstruction 01/18/2018   GERD (gastroesophageal reflux disease)    no longer having issues takes TUMS occ.   History of kidney stones    Hypotension    Irritant contact dermatitis associated with stoma 06/25/2020   Pneumonia 02/2016   walking pneumonia    RBBB    Sinus infection      Medicines Meds ordered this encounter  Medications   ALPRAZolam (XANAX) tablet 1 mg    I have reviewed the patients home medicines and have made adjustments as needed  Problem List / ED Course: Problem List Items Addressed This Visit   None Visit Diagnoses     Closed fracture of cervical vertebra, unspecified cervical vertebral level, initial encounter    -  Primary   Fall, initial encounter       Injury of head, initial encounter                {Document critical care time when appropriate:1} {Document review of labs and clinical decision tools ie heart score, Chads2Vasc2 etc:1}  {Document  your independent review of radiology images, and any outside records:1} {Document your discussion with family members, caretakers, and with consultants:1} {Document social determinants of health affecting pt's care:1} {Document your decision making why or why not admission, treatments were needed:1}  This note was created using dictation software, which may contain spelling or grammatical errors.

## 2022-06-03 NOTE — ED Provider Notes (Signed)
Emergency Department Provider Note   I have reviewed the triage vital signs and the nursing notes.   HISTORY  Chief Complaint Fall and Neck Pain   HPI Amber Klein is a 72 y.o. female presents to the emergency department for evaluation of headache and right lateral neck pain after a fall.  She was standing on a chair yesterday working on a camera when she fell landing on the ground hitting the back of her head and neck on the floor.  No known loss of consciousness.  She initially had little pain but as the day progressed and especially this morning developed more severe discomfort in her neck, especially along the right side.  No numbness or weakness.  Also notes a posterior headache.  No bleeding. No vomiting.   Past Medical History:  Diagnosis Date   Arthritis    Asthma    mild - allergy season induced   Cancer    basal cell skin cancer   Complication of anesthesia    Sensitive to propofol - and BP drops   Dysrhythmia    Ear infection    Environmental and seasonal allergies    Femoral hernia of right side with obstruction 01/18/2018   GERD (gastroesophageal reflux disease)    no longer having issues takes TUMS occ.   History of kidney stones    Hypotension    Irritant contact dermatitis associated with stoma 06/25/2020   Pneumonia 02/2016   walking pneumonia    RBBB    Sinus infection     Review of Systems  Constitutional: No fever/chills Eyes: No visual changes. Cardiovascular: Denies chest pain. Respiratory: Denies shortness of breath. Gastrointestinal: No abdominal pain.   Genitourinary: Negative for dysuria. Musculoskeletal: Negative for back pain. Positive neck pain.  Skin: Negative for rash.  Neurological: Negative for focal weakness or numbness. Positive HA.    ____________________________________________   PHYSICAL EXAM:  VITAL SIGNS: ED Triage Vitals  Enc Vitals Group     BP 06/03/22 1609 (!) 137/96     Pulse Rate 06/03/22 1609  (!) 106     Resp 06/03/22 1609 20     Temp 06/03/22 1609 98.4 F (36.9 C)     Temp src --      SpO2 06/03/22 1609 98 %     Weight 06/03/22 1608 158 lb (71.7 kg)     Height 06/03/22 1608 5\' 4"  (1.626 m)   Constitutional: Alert and oriented. Well appearing and in no acute distress. Eyes: Conjunctivae are normal.  Head: Atraumatic. Nose: No congestion/rhinnorhea. Mouth/Throat: Mucous membranes are moist.  Oropharynx non-erythematous. Neck: No stridor.  Mild midline and right paracervical tenderness.  Cardiovascular: Normal rate, regular rhythm. Good peripheral circulation. Grossly normal heart sounds.   Respiratory: Normal respiratory effort.  No retractions. Lungs CTAB. Gastrointestinal: Soft and nontender. No distention.  Musculoskeletal: No lower extremity tenderness nor edema. No gross deformities of extremities. Neurologic:  Normal speech and language.  Skin:  Skin is warm, dry and intact. No rash noted.  ____________________________________________  RADIOLOGY  CT Head Wo Contrast  Result Date: 06/03/2022 CLINICAL DATA:  Trauma EXAM: CT HEAD WITHOUT CONTRAST CT CERVICAL SPINE WITHOUT CONTRAST TECHNIQUE: Multidetector CT imaging of the head and cervical spine was performed following the standard protocol without intravenous contrast. Multiplanar CT image reconstructions of the cervical spine were also generated. RADIATION DOSE REDUCTION: This exam was performed according to the departmental dose-optimization program which includes automated exposure control, adjustment of the mA and/or kV according to  patient size and/or use of iterative reconstruction technique. COMPARISON:  None Available. FINDINGS: CT HEAD FINDINGS Brain: No evidence of acute infarction, hemorrhage, hydrocephalus, extra-axial collection or mass lesion/mass effect. Sequela of mild chronic microvascular ischemic change. Vascular: No hyperdense vessel or unexpected calcification. Skull: Mild soft tissue swelling along  the right parietal scalp. Negative for fracture or focal lesion. Sinuses/Orbits: No middle ear or mastoid effusion. Paranasal sinuses are clear. Orbits are unremarkable Other: None. CT CERVICAL SPINE FINDINGS Alignment: Straightening of the normal cervical lordosis. Trace anterolisthesis of C7 on T1. Skull base and vertebrae: No worrisome osseous lesions. Acute fracture through the anterior superior endplate of T1 (series 8, image 32). Soft tissues and spinal canal: No prevertebral fluid or swelling. No visible canal hematoma. Disc levels:  At least moderate spinal canal stenosis at C5-C6. Upper chest: Negative. Other: None IMPRESSION: CT HEAD: 1. No acute intracranial abnormality. 2. Mild soft tissue swelling along the right parietal scalp. CT CERVICAL SPINE: 1. Acute fracture through the anterior superior endplate of T1. Recommend further evaluation with a cervical spine MRI to exclude the possibility of ligamentous injury. 2. Likely moderate spinal canal stenosis at C5-C6. Electronically Signed   By: Lorenza Cambridge M.D.   On: 06/03/2022 18:32   CT Cervical Spine Wo Contrast  Result Date: 06/03/2022 CLINICAL DATA:  Trauma EXAM: CT HEAD WITHOUT CONTRAST CT CERVICAL SPINE WITHOUT CONTRAST TECHNIQUE: Multidetector CT imaging of the head and cervical spine was performed following the standard protocol without intravenous contrast. Multiplanar CT image reconstructions of the cervical spine were also generated. RADIATION DOSE REDUCTION: This exam was performed according to the departmental dose-optimization program which includes automated exposure control, adjustment of the mA and/or kV according to patient size and/or use of iterative reconstruction technique. COMPARISON:  None Available. FINDINGS: CT HEAD FINDINGS Brain: No evidence of acute infarction, hemorrhage, hydrocephalus, extra-axial collection or mass lesion/mass effect. Sequela of mild chronic microvascular ischemic change. Vascular: No hyperdense vessel  or unexpected calcification. Skull: Mild soft tissue swelling along the right parietal scalp. Negative for fracture or focal lesion. Sinuses/Orbits: No middle ear or mastoid effusion. Paranasal sinuses are clear. Orbits are unremarkable Other: None. CT CERVICAL SPINE FINDINGS Alignment: Straightening of the normal cervical lordosis. Trace anterolisthesis of C7 on T1. Skull base and vertebrae: No worrisome osseous lesions. Acute fracture through the anterior superior endplate of T1 (series 8, image 32). Soft tissues and spinal canal: No prevertebral fluid or swelling. No visible canal hematoma. Disc levels:  At least moderate spinal canal stenosis at C5-C6. Upper chest: Negative. Other: None IMPRESSION: CT HEAD: 1. No acute intracranial abnormality. 2. Mild soft tissue swelling along the right parietal scalp. CT CERVICAL SPINE: 1. Acute fracture through the anterior superior endplate of T1. Recommend further evaluation with a cervical spine MRI to exclude the possibility of ligamentous injury. 2. Likely moderate spinal canal stenosis at C5-C6. Electronically Signed   By: Lorenza Cambridge M.D.   On: 06/03/2022 18:32    ____________________________________________   PROCEDURES  Procedure(s) performed:   Procedures  None  ____________________________________________   INITIAL IMPRESSION / ASSESSMENT AND PLAN / ED COURSE  Pertinent labs & imaging results that were available during my care of the patient were reviewed by me and considered in my medical decision making (see chart for details).   This patient is Presenting for Evaluation of HA/neck pain, which does require a range of treatment options, and is a complaint that involves a high risk of morbidity and mortality.  The Differential  Diagnoses includes subdural hematoma, epidural hematoma, acute concussion, traumatic subarachnoid hemorrhage, cerebral contusions, etc.    I did obtain Additional Historical Information from family at bedside.    Radiologic Tests Ordered, included CT head and c-spine. I independently interpreted the images and agree with radiology interpretation.   Medical Decision Making: Summary:  Patient presents emergency department for evaluation of pain after fall.  She has some paraspinal tenderness and occipital scalp tenderness without hematoma or laceration.  Plan for CT imaging for assessment.   Reevaluation with update and discussion with patient. Suprisingly patient showing a fracture of T1. Rads recommending MRI c spine. Discussed with patient and placed her in an Aspen collar. Plan for transfer to [facility] for MRI. Xanax ordered for anxiety related to MRI and patient's husband with drive her POV. Dr. Posey Rea is the accepting MD at [facility].   Patient's presentation is most consistent with acute presentation with potential threat to life or bodily function.   Disposition: transfer to [facility] ED for MRI  ____________________________________________  FINAL CLINICAL IMPRESSION(S) / ED DIAGNOSES  Final diagnoses:  Closed fracture of cervical vertebra, unspecified cervical vertebral level, initial encounter  Fall, initial encounter  Injury of head, initial encounter    Note:  This document was prepared using Dragon voice recognition software and may include unintentional dictation errors.  Alona Bene, MD, [facility] Emergency Medicine    Adria Costley, Arlyss Repress, MD 06/03/22 [phone]

## 2022-06-03 NOTE — ED Triage Notes (Signed)
Pt via pov from home with head and neck pain after a fall yesterday; as well as some leg and hand pain on right side. Pt does not believe she lost consciousness, but has pain today. She reports that her pain is "not bad." Pt alert & oriented, nad noted.

## 2022-06-03 NOTE — Discharge Instructions (Signed)
Thank you for coming to [facility] Emergency Department. You were seen for fall. We did an exam, labs, and imaging, and these showed compression fracture of T1 vertebra.  We discussed with neurosurgery who recommended you follow with Dr. Dutch Quint in clinic.  They will call you to make an appointment, if they do not you can call their clinic to schedule.  Please maintain in the c-collar, you can take it off to shower but otherwise keep it on.  Return to the ED if you develop any of the following: - Fever (100.4 F or 38 C) or chills at home that do not respond to over the counter medications - Weakness, numbness, or tingling in your extremities - Difficulty emptying bladder / urinary incontinence - Fecal incontinence - Uncontrolled nausea/vomiting with inability to keep down liquids - Feeling as though you are going to pass out or passing out - Anything else that concerns you

## 2022-06-03 NOTE — ED Notes (Signed)
Mri made aware pt has been medicated for scan

## 2022-06-07 ENCOUNTER — Ambulatory Visit: Payer: 59 | Admitting: Internal Medicine

## 2022-06-07 ENCOUNTER — Encounter: Payer: Self-pay | Admitting: Internal Medicine

## 2022-06-07 VITALS — BP 124/84 | HR 100 | Temp 98.5°F | Wt 162.7 lb

## 2022-06-07 DIAGNOSIS — Z09 Encounter for follow-up examination after completed treatment for conditions other than malignant neoplasm: Secondary | ICD-10-CM

## 2022-06-07 DIAGNOSIS — S22010D Wedge compression fracture of first thoracic vertebra, subsequent encounter for fracture with routine healing: Secondary | ICD-10-CM

## 2022-06-07 NOTE — Progress Notes (Signed)
Established Patient Office Visit     CC/Reason for Visit: ED follow-up  HPI: Amber Klein is a 72 y.o. female who is coming in today for the above mentioned reasons.  She fell while adjusting an outdoor camera on her front porch backwards onto her head.  2 days later she started having intense neck pain and went to the hospital for evaluation.  CT ended up showing acute fracture through the anterior superior endplate of T1.  MRI showed a T1 wedge compression fracture with bone marrow edema with no associated ligamentous injury.  There was right facet edema at C7-T1, there was also multilevel foraminal stenosis.  She was given a c-collar and asked to follow-up with neurosurgery.   Past Medical/Surgical History: Past Medical History:  Diagnosis Date   Arthritis    Asthma    mild - allergy season induced   Cancer    basal cell skin cancer   Complication of anesthesia    Sensitive to propofol - and BP drops   Dysrhythmia    Ear infection    Environmental and seasonal allergies    Femoral hernia of right side with obstruction 01/18/2018   GERD (gastroesophageal reflux disease)    no longer having issues takes TUMS occ.   History of kidney stones    Hypotension    Irritant contact dermatitis associated with stoma 06/25/2020   Pneumonia 02/2016   walking pneumonia    RBBB    Sinus infection     Past Surgical History:  Procedure Laterality Date   CATARACT EXTRACTION     CESAREAN SECTION  1979   1985   CO2 LASER APPLICATION N/A 05/10/2015   Procedure: CO2 LASER APPLICATION;  Surgeon: Waynard Reeds, MD;  Location: [facility];  Service: Gynecology;  Laterality: N/A;   COLON SURGERY  04/10/2020   with sepsis and colostomy   COLONOSCOPY     COLONOSCOPY WITH PROPOFOL N/A 11/11/2020   Procedure: COLONOSCOPY WITH PROPOFOL;  Surgeon: Rachael Fee, MD;  Location: [facility];  Service: Endoscopy;  Laterality: N/A;  NEEDS PREVISIT WITH ANESTHESIA   FOOT SURGERY Right  2009   INCISIONAL HERNIA REPAIR N/A 04/06/2021   Procedure: HERNIA REPAIR INCISIONAL REPAIR WITH MESH;  Surgeon: Axel Filler, MD;  Location: [facility] OR;  Service: General;  Laterality: N/A;   INGUINAL HERNIA REPAIR Right 01/18/2018   Procedure: OPEN REPAIR INCARCERATED RIGHT FEMORAL HERNIA WITH MESH;  Surgeon: Claud Kelp, MD;  Location: [facility] OR;  Service: General;  Laterality: Right;  GENERAL / TAP BLOCK   INSERTION OF MESH N/A 04/06/2021   Procedure: INSERTION OF MESH;  Surgeon: Axel Filler, MD;  Location: [facility] OR;  Service: General;  Laterality: N/A;   KNEE SURGERY Left 2016   LAPAROTOMY N/A 04/06/2021   Procedure: EXPLORATORY LAPAROTOMY;  Surgeon: Axel Filler, MD;  Location: [facility] OR;  Service: General;  Laterality: N/A;   LYSIS OF ADHESION N/A 04/06/2021   Procedure: LYSIS OF ADHESION;  Surgeon: Axel Filler, MD;  Location: [facility] OR;  Service: General;  Laterality: N/A;   ORIF ELBOW FRACTURE Left 07/04/2014   Procedure: OPEN REDUCTION INTERNAL FIXATION (ORIF) ELBOW/OLECRANON FRACTURE;  Surgeon: Cammy Copa, MD;  Location: [facility];  Service: Orthopedics;  Laterality: Left;   STOMA DILATATION N/A 12/08/2020   Procedure: STOMA DILATATION WITH STOMAPLASTY;  Surgeon: Luretha Murphy, MD;  Location: [facility];  Service: General;  Laterality: N/A;   VULVECTOMY N/A 08/10/2016   Procedure: WIDE EXCISION VULVECTOMY;  Surgeon: Waynard Reeds,  MD;  Location: [facility];  Service: Gynecology;  Laterality: N/A;   XI ROBOTIC ASSISTED COLOSTOMY TAKEDOWN N/A 01/05/2021   Procedure: ROBOTIC COLOSTOMY REVERSAL WITH LAPAROSCOPIC LYSIS OF ADHESIONS;  Surgeon: Romie Levee, MD;  Location: [facility];  Service: General;  Laterality: N/A;    Social History:  reports that she quit smoking about 7 years ago. Her smoking use included cigarettes. She has a 4.00 pack-year smoking history. She has never used smokeless tobacco. She reports current alcohol use. She reports that she does not use  drugs.  Allergies: Allergies  Allergen Reactions   Iodinated Contrast Media Anaphylaxis   Shellfish Allergy Anaphylaxis   Vancomycin Anaphylaxis   Wasp Venom Anaphylaxis   Ciprofloxacin Hives and Swelling    Hives   Sulfa Antibiotics Swelling and Other (See Comments)    Migraine   Zithromax [Azithromycin] Hives   Latex Rash    Sensitivity on too long   Propofol     Couldn't wake patient up - needs lower doses     Family History:  Family History  Problem Relation Age of Onset   Dementia Mother    Kidney cancer Father    Breast cancer Maternal Grandmother    Colon cancer Neg Hx    Esophageal cancer Neg Hx    Pancreatic cancer Neg Hx    Stomach cancer Neg Hx      Current Outpatient Medications:    albuterol (VENTOLIN HFA) 108 (90 Base) MCG/ACT inhaler, Inhale 2 puffs into the lungs every 6 (six) hours as needed for wheezing or shortness of breath., Disp: 8.5 g, Rfl: 2   EPINEPHrine 0.3 MG/0.3ML SOSY, Inject 0.3 mLs (0.3 mg total) into the muscle as needed for anaphylaxis, Disp: 1 each, Rfl: 2   estradiol (CLIMARA - DOSED IN MG/24 HR) 0.05 mg/24hr patch, 0.05 mg once a week., Disp: , Rfl:    fexofenadine-pseudoephedrine (ALLEGRA-D) 60-120 MG 12 hr tablet, Take 1 tablet by mouth daily., Disp: , Rfl:    latanoprost (XALATAN) 0.005 % ophthalmic solution, Place 1 drop into both eyes at bedtime., Disp: , Rfl:    Multiple Vitamin (MULTIVITAMIN WITH MINERALS) TABS tablet, Take 1 tablet by mouth daily., Disp: , Rfl:    progesterone (PROMETRIUM) 100 MG capsule, Take 100 mg by mouth daily., Disp: , Rfl:   Review of Systems:  Negative unless indicated in HPI.   Physical Exam: Vitals:   06/07/22 1421  BP: 124/84  Pulse: 100  Temp: 98.5 F (36.9 C)  TempSrc: Oral  SpO2: 98%  Weight: 162 lb 11.2 oz (73.8 kg)    Body mass index is 27.93 kg/m.   Physical Exam Vitals reviewed.  Constitutional:      Appearance: Normal appearance.  HENT:     Head: Normocephalic and  atraumatic.  Eyes:     Conjunctiva/sclera: Conjunctivae normal.     Pupils: Pupils are equal, round, and reactive to light.  Skin:    General: Skin is warm and dry.  Neurological:     General: No focal deficit present.     Mental Status: She is alert and oriented to person, place, and time.  Psychiatric:        Mood and Affect: Mood normal.        Behavior: Behavior normal.        Thought Content: Thought content normal.        Judgment: Judgment normal.      Impression and Plan:  Hospital discharge follow-up  Closed wedge compression fracture of  T1 vertebra with routine healing, subsequent encounter  [facility] chart reviewed in detail, including CT and MRI. -She will be seeing neurosurgery, Dr. Dutch Quint, next week.  In the meantime she is advised to use her c-collar 24/7.  Time spent:30 minutes reviewing chart, interviewing and examining patient and formulating plan of care.     Chaya Jan, MD  Primary Care at [facility]

## 2022-07-06 ENCOUNTER — Other Ambulatory Visit: Payer: Self-pay

## 2022-07-06 ENCOUNTER — Encounter (HOSPITAL_BASED_OUTPATIENT_CLINIC_OR_DEPARTMENT_OTHER): Payer: Self-pay | Admitting: Physical Therapy

## 2022-07-06 ENCOUNTER — Ambulatory Visit (HOSPITAL_BASED_OUTPATIENT_CLINIC_OR_DEPARTMENT_OTHER): Payer: 59 | Attending: Neurosurgery | Admitting: Physical Therapy

## 2022-07-06 DIAGNOSIS — M546 Pain in thoracic spine: Secondary | ICD-10-CM | POA: Insufficient documentation

## 2022-07-06 DIAGNOSIS — M542 Cervicalgia: Secondary | ICD-10-CM | POA: Insufficient documentation

## 2022-07-06 NOTE — Therapy (Signed)
OUTPATIENT PHYSICAL THERAPY CERVICAL EVALUATION   Patient Name: Amber Klein MRN: [id number] DOB:[birth year], 72 y.o., female Today's Date: 07/07/2022  END OF SESSION:  PT End of Session - 07/07/22 2202     Visit Number 1    Number of Visits 10    Date for PT Re-Evaluation 08/31/22    Authorization Type [employer]    PT Start Time 1538    PT Stop Time 1626    PT Time Calculation (min) 48 min    Activity Tolerance Patient tolerated treatment well    Behavior During Therapy WFL for tasks assessed/performed             Past Medical History:  Diagnosis Date   Arthritis    Asthma    mild - allergy season induced   Cancer (HCC)    basal cell skin cancer   Complication of anesthesia    Sensitive to propofol - and BP drops   Dysrhythmia    Ear infection    Environmental and seasonal allergies    Femoral hernia of right side with obstruction 01/18/2018   GERD (gastroesophageal reflux disease)    no longer having issues takes TUMS occ.   History of kidney stones    Hypotension    Irritant contact dermatitis associated with stoma 06/25/2020   Pneumonia 02/2016   walking pneumonia    RBBB    Sinus infection    Past Surgical History:  Procedure Laterality Date   CATARACT EXTRACTION     CESAREAN SECTION  1979   1985   CO2 LASER APPLICATION N/A 05/10/2015   Procedure: CO2 LASER APPLICATION;  Surgeon: Waynard Reeds, MD;  Location: [facility];  Service: Gynecology;  Laterality: N/A;   COLON SURGERY  04/10/2020   with sepsis and colostomy   COLONOSCOPY     COLONOSCOPY WITH PROPOFOL N/A 11/11/2020   Procedure: COLONOSCOPY WITH PROPOFOL;  Surgeon: Rachael Fee, MD;  [facility];  Service: Endoscopy;  Laterality: N/A;  NEEDS PREVISIT WITH ANESTHESIA   FOOT SURGERY Right 2009   INCISIONAL HERNIA REPAIR N/A 04/06/2021   Procedure: HERNIA REPAIR INCISIONAL REPAIR WITH MESH;  Surgeon: Axel Filler, MD;  Location: [facility] OR;  Service: General;  Laterality: N/A;    INGUINAL HERNIA REPAIR Right 01/18/2018   Procedure: OPEN REPAIR INCARCERATED RIGHT FEMORAL HERNIA WITH MESH;  Surgeon: Claud Kelp, MD;  Location: [facility] OR;  Service: General;  Laterality: Right;  GENERAL / TAP BLOCK   INSERTION OF MESH N/A 04/06/2021   Procedure: INSERTION OF MESH;  Surgeon: Axel Filler, MD;  Location: [facility] OR;  Service: General;  Laterality: N/A;   KNEE SURGERY Left 2016   LAPAROTOMY N/A 04/06/2021   Procedure: EXPLORATORY LAPAROTOMY;  Surgeon: Axel Filler, MD;  Location: [facility] OR;  Service: General;  Laterality: N/A;   LYSIS OF ADHESION N/A 04/06/2021   Procedure: LYSIS OF ADHESION;  Surgeon: Axel Filler, MD;  Location: [facility] OR;  Service: General;  Laterality: N/A;   ORIF ELBOW FRACTURE Left 07/04/2014   Procedure: OPEN REDUCTION INTERNAL FIXATION (ORIF) ELBOW/OLECRANON FRACTURE;  Surgeon: Cammy Copa, MD;  Location: [facility];  Service: Orthopedics;  Laterality: Left;   STOMA DILATATION N/A 12/08/2020   Procedure: STOMA DILATATION WITH STOMAPLASTY;  Surgeon: Luretha Murphy, MD;  Location: [facility];  Service: General;  Laterality: N/A;   VULVECTOMY N/A 08/10/2016   Procedure: WIDE EXCISION VULVECTOMY;  Surgeon: Waynard Reeds, MD;  Location: [facility];  Service: Gynecology;  Laterality: N/A;   XI ROBOTIC ASSISTED  COLOSTOMY TAKEDOWN N/A 01/05/2021   Procedure: ROBOTIC COLOSTOMY REVERSAL WITH LAPAROSCOPIC LYSIS OF ADHESIONS;  Surgeon: Romie Levee, MD;  Location: [facility];  Service: General;  Laterality: N/A;   Patient Active Problem List   Diagnosis Date Noted   S/P hernia repair 04/06/2021   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   Colonic obstruction (HCC) 12/08/2020   Diverticulosis of colon 10/08/2020   Intractable vomiting 07/06/2020   Hypoxia 07/06/2020   Irritant contact dermatitis associated with stoma 06/25/2020   Diverticulitis 05/21/2020   GERD (gastroesophageal reflux disease)    Acute suppurative peritonitis (HCC) 04/11/2020   Perforated sigmoid colon  (HCC) 04/10/2020   Other constipation 04/06/2020   Femoral hernia of right side with obstruction 01/18/2018   Elbow fracture 07/04/2014     REFERRING PROVIDER: Julio Sicks, MD  REFERRING DIAG: [phone] (ICD-10-CM) - Spinal stenosis, cervical region  THERAPY DIAG:  Cervicalgia  Pain in thoracic spine  Stiffness of unspecified joint, not elsewhere classified  Rationale for Evaluation and Treatment: Rehabilitation  ONSET DATE: 06/02/2022  SUBJECTIVE:                                                                                                                                                                                                         SUBJECTIVE STATEMENT: Pt was standing on a chair and fell backwards off the chair landing on the ground hitting the back of her head and neck.  Pt used ice and advil.  She started getting a H/A.   Pt went to ER and had CT showing no acute intracranial abnormality though had an acute fx of anterior superior endplate of T1 and likely moderate spinal canal stenosis at C5-6.  Trace anterolisthesis of C7 on T1.  MD recommended MRI and placed pt in a cervical brace/collar.  Pt went to [facility] ER and had a MRI showed a wedge T1 compression fx with bone marrow edema with no associated ligamentous injury.  Pt was referred to neurosurgery.  Pt saw neurosurgeon on 5/1 and note indicated MD removed the brace, activities as tolerated, and PT for cervical stenosis. Pt wore a cervical brace for 12-14 days.   "I feel great, just a little sore".  Pt states she doesn't have much pain, just more discomfort.  Pt reports stiffness and limited mobility in cervical including turning head.  Pt is limited with her normal lifting.  She does have some stiffness 1st thing in AM.   She is sleeping ok.  Pt uses a heating pad which helps mobility and pain.  She can have occasional  shooting pain when turning head to the right which goes away with returning to neutral.   Hand  dominance: Right  PERTINENT HISTORY:  T1 Compression Fracture on 06/02/22 Per dx and CT/MRI findings  PMHx:  Arthritis, osteopenia, R BBB, R foot surgery in 2009, L knee surgery 2016, ORIF L elbow fx in 2016  PAIN:  Are you having pain? Yes Location:  R sided cervical, R sided upper to mid thoracic, R medial scap 4/10 pain currently.  Pt states she has been in a lot of meetings today.   5/10 worst pain, 0/10 best pain  PRECAUTIONS: Other: T1 compression fracture  WEIGHT BEARING RESTRICTIONS: none indicated on MD order  FALLS:  Has patient fallen in last 6 months? Yes. 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  PLOF: Independent.  Pt was able to perform all of her daily activities without cervical pain.  Pt was able to turn her head without difficulty.   PATIENT GOALS: to be able to turn her neck   OBJECTIVE:   DIAGNOSTIC FINDINGS:  IMPRESSION: CT HEAD:   1. No acute intracranial abnormality. 2. Mild soft tissue swelling along the right parietal scalp.   CT CERVICAL SPINE:  -Trace anterolisthesis of C7 on T1.  1. Acute fracture through the anterior superior endplate of T1. Recommend further evaluation with a cervical spine MRI to exclude the possibility of ligamentous injury. 2. Likely moderate spinal canal stenosis at C5-C6.  MR Cervical Spine Wo Contrast 1. Mild T1 wedge compression fracture with bone marrow edema underlying the superior endplate. No associated ligamentous injury. 2. Right facet edema at C7-T1, which may be a source of local neck pain. 3. Moderate C6-7 spinal canal stenosis with severe bilateral, right worse than left neural foraminal stenosis. 4. Mild spinal canal stenosis at C4-5 and C5-6 with moderate right and severe left C4-5 neural foraminal stenosis.  PATIENT SURVEYS:  FOTO 59 with a goal of 69 at visit #10  COGNITION: Overall cognitive status: Within  functional limits for tasks assessed   POSTURE:  Pt has good standing and seated posture.  Pt does not have forward head posture.    PALPATION: Pt is more tender to palpate R > L cervical paraspinals and very tender at R > L mid thoracic paraspinals and medial scap mm.  She has tenderness in bilat UT.  Tender at T1  Pt has moderate tightness in bilat UT.  Pt has soft tissue tightness in R>L medial scap mm.  CERVICAL ROM:   Active ROM A/PROM (deg) eval  Flexion   Extension   Right lateral flexion 17  Left lateral flexion 8  Right rotation 34  Left rotation 25   (Blank rows = not tested)  UPPER EXTREMITY ROM:  Active ROM Right eval Left eval  Shoulder flexion 168 156  Shoulder extension    Shoulder abduction    Shoulder adduction    Shoulder extension    Shoulder internal rotation    Shoulder external rotation    Elbow flexion    Elbow extension    Wrist flexion    Wrist extension    Wrist ulnar deviation    Wrist radial deviation    Wrist pronation    Wrist supination     (Blank rows = not tested)     TODAY'S TREATMENT:  Educated pt in correct posture and how it relates to compression fracture.  Informed pt on the importance of correct posture.  Educated pt in the anatomy and movements/positions to avoid relating to compression fracture.  Used the internet and a spinal model to educate pt concerning movements and anatomy.    PATIENT EDUCATION:  Education details: objective findings, posture, restrictions relating to compression fx, prognsosis, relevant anatomy, rationale of interventions, and POC.  PT answered pt's questions.   Person educated: Patient Education method: Medical illustrator Education comprehension: verbalized understanding  HOME EXERCISE PROGRAM: Will give at a later date  ASSESSMENT:  CLINICAL  IMPRESSION: Patient is a 72 y.o. female 4 weeks and 6 days s/p T1 compression fracture presenting to the clinic with a dx of cervical stenosis.  "I feel great, just a little sore".  Pt states she doesn't have much pain, just more discomfort.  Pt reports stiffness and limited mobility in cervical including turning head.  Pt is limited with her normal lifting.  She does have some stiffness 1st thing in AM.   She is sleeping ok.  Pt uses a heating pad which helps mobility and pain.  She can have occasional shooting pain when turning head to the right which goes away with returning to neutral.     OBJECTIVE IMPAIRMENTS: {opptimpairments:25111}.   ACTIVITY LIMITATIONS: {activitylimitations:27494}  PARTICIPATION LIMITATIONS: {participationrestrictions:25113}  PERSONAL FACTORS: {Personal factors:25162} are also affecting patient's functional outcome.   REHAB POTENTIAL: Good  CLINICAL DECISION MAKING: Stable/uncomplicated  EVALUATION COMPLEXITY: Low   GOALS:  SHORT TERM GOALS: Target date: 08/03/2022   *** Baseline:  Goal status: {GOALSTATUS:25110}  2.  *** Baseline:  Goal status: {GOALSTATUS:25110}  3.  *** Baseline:  Goal status: {GOALSTATUS:25110}  4.  *** Baseline:  Goal status: {GOALSTATUS:25110}  5.  *** Baseline:  Goal status: {GOALSTATUS:25110}  6.  *** Baseline:  Goal status: {GOALSTATUS:25110}  LONG TERM GOALS: Target date: 08/31/2022  *** Baseline:  Goal status: {GOALSTATUS:25110}  2.  *** Baseline:  Goal status: {GOALSTATUS:25110}  3.  *** Baseline:  Goal status: {GOALSTATUS:25110}  4.  *** Baseline:  Goal status: {GOALSTATUS:25110}  5.  *** Baseline:  Goal status: {GOALSTATUS:25110}  6.  *** Baseline:  Goal status: {GOALSTATUS:25110}   PLAN:  PT FREQUENCY:  1x/wk with progression to 2x/wk as needed  PT DURATION: 8 weeks  PLANNED INTERVENTIONS: {rehab planned interventions:25118::"Therapeutic exercises","Therapeutic  activity","Neuromuscular re-education","Balance training","Gait training","Patient/Family education","Self Care","Joint mobilization"}  PLAN FOR NEXT SESSION: ***   Aaron Edelman, PT 07/07/2022, 11:00 PM

## 2022-07-14 ENCOUNTER — Ambulatory Visit (HOSPITAL_BASED_OUTPATIENT_CLINIC_OR_DEPARTMENT_OTHER): Payer: 59

## 2022-07-14 ENCOUNTER — Encounter (HOSPITAL_BASED_OUTPATIENT_CLINIC_OR_DEPARTMENT_OTHER): Payer: Self-pay

## 2022-07-14 DIAGNOSIS — M546 Pain in thoracic spine: Secondary | ICD-10-CM

## 2022-07-14 DIAGNOSIS — M542 Cervicalgia: Secondary | ICD-10-CM

## 2022-07-14 NOTE — Therapy (Signed)
OUTPATIENT PHYSICAL THERAPY CERVICAL TREATMENT   Patient Name: Amber Klein MRN: [id number] DOB:Oct 08, 1950, 72 y.o., female Today's Date: 07/14/2022  END OF SESSION:  PT End of Session - 07/14/22 1508     Visit Number 2    Number of Visits 10    Date for PT Re-Evaluation 08/31/22    Authorization Type [employer]    PT Start Time 1433    PT Stop Time 1518    PT Time Calculation (min) 45 min    Activity Tolerance Patient tolerated treatment well    Behavior During Therapy WFL for tasks assessed/performed              Past Medical History:  Diagnosis Date   Arthritis    Asthma    mild - allergy season induced   Cancer (HCC)    basal cell skin cancer   Complication of anesthesia    Sensitive to propofol - and BP drops   Dysrhythmia    Ear infection    Environmental and seasonal allergies    Femoral hernia of right side with obstruction 01/18/2018   GERD (gastroesophageal reflux disease)    no longer having issues takes TUMS occ.   History of kidney stones    Hypotension    Irritant contact dermatitis associated with stoma 06/25/2020   Pneumonia 02/2016   walking pneumonia    RBBB    Sinus infection    Past Surgical History:  Procedure Laterality Date   CATARACT EXTRACTION     CESAREAN SECTION  1979   1985   CO2 LASER APPLICATION N/A 05/10/2015   Procedure: CO2 LASER APPLICATION;  Surgeon: Waynard Reeds, MD;  Location: [facility];  Service: Gynecology;  Laterality: N/A;   COLON SURGERY  04/10/2020   with sepsis and colostomy   COLONOSCOPY     COLONOSCOPY WITH PROPOFOL N/A 11/11/2020   Procedure: COLONOSCOPY WITH PROPOFOL;  Surgeon: Rachael Fee, MD;  Location: [facility];  Service: Endoscopy;  Laterality: N/A;  NEEDS PREVISIT WITH ANESTHESIA   FOOT SURGERY Right 2009   INCISIONAL HERNIA REPAIR N/A 04/06/2021   Procedure: HERNIA REPAIR INCISIONAL REPAIR WITH MESH;  Surgeon: Axel Filler, MD;  Location: [facility] OR;  Service: General;  Laterality: N/A;    INGUINAL HERNIA REPAIR Right 01/18/2018   Procedure: OPEN REPAIR INCARCERATED RIGHT FEMORAL HERNIA WITH MESH;  Surgeon: Claud Kelp, MD;  Location: [facility] OR;  Service: General;  Laterality: Right;  GENERAL / TAP BLOCK   INSERTION OF MESH N/A 04/06/2021   Procedure: INSERTION OF MESH;  Surgeon: Axel Filler, MD;  Location: [facility] OR;  Service: General;  Laterality: N/A;   KNEE SURGERY Left 2016   LAPAROTOMY N/A 04/06/2021   Procedure: EXPLORATORY LAPAROTOMY;  Surgeon: Axel Filler, MD;  Location: [facility] OR;  Service: General;  Laterality: N/A;   LYSIS OF ADHESION N/A 04/06/2021   Procedure: LYSIS OF ADHESION;  Surgeon: Axel Filler, MD;  Location: [facility] OR;  Service: General;  Laterality: N/A;   ORIF ELBOW FRACTURE Left 07/04/2014   Procedure: OPEN REDUCTION INTERNAL FIXATION (ORIF) ELBOW/OLECRANON FRACTURE;  Surgeon: Cammy Copa, MD;  Location: [facility];  Service: Orthopedics;  Laterality: Left;   STOMA DILATATION N/A 12/08/2020   Procedure: STOMA DILATATION WITH STOMAPLASTY;  Surgeon: Luretha Murphy, MD;  Location: [facility];  Service: General;  Laterality: N/A;   VULVECTOMY N/A 08/10/2016   Procedure: WIDE EXCISION VULVECTOMY;  Surgeon: Waynard Reeds, MD;  Location: [facility];  Service: Gynecology;  Laterality: N/A;   XI ROBOTIC  ASSISTED COLOSTOMY TAKEDOWN N/A 01/05/2021   Procedure: ROBOTIC COLOSTOMY REVERSAL WITH LAPAROSCOPIC LYSIS OF ADHESIONS;  Surgeon: Romie Levee, MD;  Location: [facility];  Service: General;  Laterality: N/A;   Patient Active Problem List   Diagnosis Date Noted   S/P hernia repair 04/06/2021   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   Colonic obstruction (HCC) 12/08/2020   Diverticulosis of colon 10/08/2020   Intractable vomiting 07/06/2020   Hypoxia 07/06/2020   Irritant contact dermatitis associated with stoma 06/25/2020   Diverticulitis 05/21/2020   GERD (gastroesophageal reflux disease)    Acute suppurative peritonitis (HCC) 04/11/2020   Perforated sigmoid colon  (HCC) 04/10/2020   Other constipation 04/06/2020   Femoral hernia of right side with obstruction 01/18/2018   Elbow fracture 07/04/2014     REFERRING PROVIDER: Julio Sicks, MD  REFERRING DIAG: [phone] (ICD-10-CM) - Spinal stenosis, cervical region  THERAPY DIAG:  Cervicalgia  Pain in thoracic spine  Stiffness of unspecified joint, not elsewhere classified  Rationale for Evaluation and Treatment: Rehabilitation  ONSET DATE: 06/02/2022  SUBJECTIVE:                                                                                                                                                                                                         SUBJECTIVE STATEMENT:  Pt reports overall improvement since IE, though still having some thoracic pain when sleeping. She reports overall stiffness. Has not been using heat on neck.   EVAL: Pt was standing on a chair and fell backwards off the chair landing on the ground hitting the back of her head and neck.  Pt used ice and advil.  She started getting a H/A.   Pt went to ER and had CT showing no acute intracranial abnormality though had an acute fx of anterior superior endplate of T1 and likely moderate spinal canal stenosis at C5-6.  Trace anterolisthesis of C7 on T1.  MD recommended MRI and placed pt in a cervical brace/collar.  Pt went to [facility] ER and had a MRI showed a wedge T1 compression fx with bone marrow edema with no associated ligamentous injury.  Pt was referred to neurosurgery.  Pt saw neurosurgeon on 5/1 and note indicated MD removed the brace, activities as tolerated, and PT for cervical stenosis. Pt wore a cervical brace for 12-14 days.   "I feel great, just a little sore".  Pt states she doesn't have much pain, just more discomfort.  Pt reports stiffness and limited mobility in cervical including turning head.  Pt is limited with her normal lifting.  She  does have some stiffness 1st thing in AM.   She is sleeping ok.  Pt uses a  heating pad which helps mobility and pain.  She can have occasional shooting pain when turning head to the right which goes away with returning to neutral.  Pt states she is able to perform her household chores and work activities.   Hand dominance: Right  PERTINENT HISTORY:  T1 Compression Fracture on 06/02/22 Per dx and CT/MRI findings  PMHx:  Arthritis, osteopenia, R BBB, R foot surgery in 2009, L knee surgery 2016, ORIF L elbow fx in 2016  PAIN:  Are you having pain? Yes Location:  R sided cervical, R sided upper to mid thoracic, R medial scap 4/10 pain currently.  Pt states she has been in a lot of meetings today.   5/10 worst pain, 0/10 best pain  PRECAUTIONS: Other: T1 compression fracture  WEIGHT BEARING RESTRICTIONS: none indicated on MD order  FALLS:  Has patient fallen in last 6 months? Yes. 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  PLOF: Independent.  Pt was able to perform all of her daily activities without cervical pain.  Pt was able to turn her head without difficulty.   PATIENT GOALS: to be able to turn her neck   OBJECTIVE:   DIAGNOSTIC FINDINGS:  IMPRESSION: CT HEAD:   1. No acute intracranial abnormality. 2. Mild soft tissue swelling along the right parietal scalp.   CT CERVICAL SPINE:  -Trace anterolisthesis of C7 on T1.  1. Acute fracture through the anterior superior endplate of T1. Recommend further evaluation with a cervical spine MRI to exclude the possibility of ligamentous injury. 2. Likely moderate spinal canal stenosis at C5-C6.  MR Cervical Spine Wo Contrast 1. Mild T1 wedge compression fracture with bone marrow edema underlying the superior endplate. No associated ligamentous injury. 2. Right facet edema at C7-T1, which may be a source of local neck pain. 3. Moderate C6-7 spinal canal stenosis with severe bilateral, right worse than left neural  foraminal stenosis. 4. Mild spinal canal stenosis at C4-5 and C5-6 with moderate right and severe left C4-5 neural foraminal stenosis.  PATIENT SURVEYS:  FOTO 59 with a goal of 69 at visit #10  COGNITION: Overall cognitive status: Within functional limits for tasks assessed   POSTURE:  Pt has good standing and seated posture.  Pt does not have forward head posture.    PALPATION: Pt is more tender to palpate R > L cervical paraspinals and very tender at R > L mid thoracic paraspinals and medial scap mm.  She has tenderness in bilat UT.  Tender at T1  Pt has moderate tightness in bilat UT.  Pt has soft tissue tightness in R>L medial scap mm.  CERVICAL ROM:   Active ROM A/PROM (deg) eval  Flexion   Extension   Right lateral flexion 17  Left lateral flexion 8  Right rotation 34  Left rotation 25   (Blank rows = not tested)  UPPER EXTREMITY ROM:  Active ROM Right eval Left eval  Shoulder flexion 168 156  Shoulder extension    Shoulder abduction    Shoulder adduction    Shoulder extension    Shoulder internal rotation    Shoulder external rotation    Elbow flexion    Elbow extension    Wrist flexion    Wrist extension    Wrist ulnar deviation  Wrist radial deviation    Wrist pronation    Wrist supination     (Blank rows = not tested)     TODAY'S TREATMENT:                                                                                                                                STM to cervical paraspinals bilaterally, sub occipital STM/release (supine) STM to thoracic paraspinals and scapular mm. In seated position   Rhomboid stretch at doorway- 15 seconds x3  PATIENT EDUCATION:  Education details: objective findings, posture, restrictions relating to compression fx, prognsosis, relevant anatomy, rationale of interventions, and POC.  PT answered pt's questions.   Person educated: Patient Education method: Medical illustrator Education  comprehension: verbalized understanding  HOME EXERCISE PROGRAM: Will give at a later date  ASSESSMENT:  CLINICAL IMPRESSION: Focused on manual interventions today to address significant soft tissue restrictions in cervical mm, sub occipitals, and thoracic paraspinals.  Patient reported significant relief at end of session following manual therapy.  Educated patient on DOMS and instructed to use heating pad at home to aid with this.  Educated about posture and positional changes.  Will continue to monitor pain level and progress as tolerated.  OBJECTIVE IMPAIRMENTS: decreased activity tolerance, decreased ROM, decreased strength, hypomobility, increased fascial restrictions, increased muscle spasms, and pain.   ACTIVITY LIMITATIONS: carrying and lifting  PARTICIPATION LIMITATIONS: driving and shopping  PERSONAL FACTORS: 1-2 comorbidities: T1 compression fracture and arthritis  are also affecting patient's functional outcome.   REHAB POTENTIAL: Good  CLINICAL DECISION MAKING: Stable/uncomplicated  EVALUATION COMPLEXITY: Low   GOALS:  SHORT TERM GOALS: Target date: 08/03/2022   Pt will report at least a 25% improvement in stiffness and turning her head for improved daily mobility.  Baseline:  Goal status: INITIAL  2.  Pt will demo at least a 6-8 deg increase in cervical Sb'ing AROM and 10 deg in cervical rotation AROM bilat for improved stiffness and daily mobility.  Baseline:  Goal status: INITIAL   LONG TERM GOALS: Target date: 08/31/2022  Pt will be able to turn her head while driving without significant pain and difficulty.  Baseline:  Goal status: INITIAL  2.  Pt will be independent with HEP for improved pain, ROM, and function.   Baseline:  Goal status: INITIAL  3.  Pt will be able to perform her ADLs and IADLs without significant pain and difficulty.  Baseline:  Goal status: INITIAL  4.  Pt will demo at least 55 deg of cervical rotation AROM bilat for improved  stiffness and daily mobility. Baseline:  Goal status: INITIAL     PLAN:  PT FREQUENCY:  1x/wk with progression to 2x/wk as needed  PT DURATION: 8 weeks  PLANNED INTERVENTIONS: Therapeutic exercises, Therapeutic activity, Neuromuscular re-education, Patient/Family education, Self Care, Joint mobilization, Aquatic Therapy, Dry Needling, Electrical stimulation, Cryotherapy, Moist heat, Taping, Ultrasound, Manual therapy, and Re-evaluation  PLAN FOR NEXT SESSION:  Cont with ther ex and STW per healing considerations per T1 compression fracture   Riki Altes, PTA  07/14/22 5:10 PM

## 2022-07-22 ENCOUNTER — Ambulatory Visit (HOSPITAL_BASED_OUTPATIENT_CLINIC_OR_DEPARTMENT_OTHER): Payer: 59 | Attending: Neurosurgery

## 2022-07-22 ENCOUNTER — Encounter (HOSPITAL_BASED_OUTPATIENT_CLINIC_OR_DEPARTMENT_OTHER): Payer: Self-pay

## 2022-07-22 DIAGNOSIS — M546 Pain in thoracic spine: Secondary | ICD-10-CM | POA: Insufficient documentation

## 2022-07-22 DIAGNOSIS — M542 Cervicalgia: Secondary | ICD-10-CM | POA: Insufficient documentation

## 2022-07-22 NOTE — Therapy (Signed)
OUTPATIENT PHYSICAL THERAPY CERVICAL TREATMENT   Patient Name: Armiyah Almeda MRN: [id number] DOB:[birth year], 72 y.o., female Today's Date: 07/22/2022  END OF SESSION:  PT End of Session - 07/22/22 1213     Visit Number 3    Number of Visits 10    Date for PT Re-Evaluation 08/31/22    Authorization Type [employer]    PT Start Time 1130    PT Stop Time 1210    PT Time Calculation (min) 40 min    Activity Tolerance Patient tolerated treatment well    Behavior During Therapy WFL for tasks assessed/performed               Past Medical History:  Diagnosis Date   Arthritis    Asthma    mild - allergy season induced   Cancer (HCC)    basal cell skin cancer   Complication of anesthesia    Sensitive to propofol - and BP drops   Dysrhythmia    Ear infection    Environmental and seasonal allergies    Femoral hernia of right side with obstruction 01/18/2018   GERD (gastroesophageal reflux disease)    no longer having issues takes TUMS occ.   History of kidney stones    Hypotension    Irritant contact dermatitis associated with stoma 06/25/2020   Pneumonia 02/2016   walking pneumonia    RBBB    Sinus infection    Past Surgical History:  Procedure Laterality Date   CATARACT EXTRACTION     CESAREAN SECTION  1979   1985   CO2 LASER APPLICATION N/A 05/10/2015   Procedure: CO2 LASER APPLICATION;  Surgeon: Waynard Reeds, MD;  Location: [facility];  Service: Gynecology;  Laterality: N/A;   COLON SURGERY  04/10/2020   with sepsis and colostomy   COLONOSCOPY     COLONOSCOPY WITH PROPOFOL N/A 11/11/2020   Procedure: COLONOSCOPY WITH PROPOFOL;  Surgeon: Rachael Fee, MD;  Location: [facility];  Service: Endoscopy;  Laterality: N/A;  NEEDS PREVISIT WITH ANESTHESIA   FOOT SURGERY Right 2009   INCISIONAL HERNIA REPAIR N/A 04/06/2021   Procedure: HERNIA REPAIR INCISIONAL REPAIR WITH MESH;  Surgeon: Axel Filler, MD;  Location: [facility] OR;  Service: General;  Laterality: N/A;    INGUINAL HERNIA REPAIR Right 01/18/2018   Procedure: OPEN REPAIR INCARCERATED RIGHT FEMORAL HERNIA WITH MESH;  Surgeon: Claud Kelp, MD;  Location: [facility] OR;  Service: General;  Laterality: Right;  GENERAL / TAP BLOCK   INSERTION OF MESH N/A 04/06/2021   Procedure: INSERTION OF MESH;  Surgeon: Axel Filler, MD;  Location: [facility] OR;  Service: General;  Laterality: N/A;   KNEE SURGERY Left 2016   LAPAROTOMY N/A 04/06/2021   Procedure: EXPLORATORY LAPAROTOMY;  Surgeon: Axel Filler, MD;  Location: [facility] OR;  Service: General;  Laterality: N/A;   LYSIS OF ADHESION N/A 04/06/2021   Procedure: LYSIS OF ADHESION;  Surgeon: Axel Filler, MD;  Location: [facility] OR;  Service: General;  Laterality: N/A;   ORIF ELBOW FRACTURE Left 07/04/2014   Procedure: OPEN REDUCTION INTERNAL FIXATION (ORIF) ELBOW/OLECRANON FRACTURE;  Surgeon: Cammy Copa, MD;  Location: [facility];  Service: Orthopedics;  Laterality: Left;   STOMA DILATATION N/A 12/08/2020   Procedure: STOMA DILATATION WITH STOMAPLASTY;  Surgeon: Luretha Murphy, MD;  Location: [facility];  Service: General;  Laterality: N/A;   VULVECTOMY N/A 08/10/2016   Procedure: WIDE EXCISION VULVECTOMY;  Surgeon: Waynard Reeds, MD;  Location: [facility];  Service: Gynecology;  Laterality: N/A;   XI  ROBOTIC ASSISTED COLOSTOMY TAKEDOWN N/A 01/05/2021   Procedure: ROBOTIC COLOSTOMY REVERSAL WITH LAPAROSCOPIC LYSIS OF ADHESIONS;  Surgeon: Romie Levee, MD;  Location: [facility];  Service: General;  Laterality: N/A;   Patient Active Problem List   Diagnosis Date Noted   S/P hernia repair 04/06/2021   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   Colonic obstruction (HCC) 12/08/2020   Diverticulosis of colon 10/08/2020   Intractable vomiting 07/06/2020   Hypoxia 07/06/2020   Irritant contact dermatitis associated with stoma 06/25/2020   Diverticulitis 05/21/2020   GERD (gastroesophageal reflux disease)    Acute suppurative peritonitis (HCC) 04/11/2020   Perforated sigmoid colon  (HCC) 04/10/2020   Other constipation 04/06/2020   Femoral hernia of right side with obstruction 01/18/2018   Elbow fracture 07/04/2014     REFERRING PROVIDER: Julio Sicks, MD  REFERRING DIAG: [phone] (ICD-10-CM) - Spinal stenosis, cervical region  THERAPY DIAG:  Cervicalgia  Pain in thoracic spine  Stiffness of unspecified joint, not elsewhere classified  Rationale for Evaluation and Treatment: Rehabilitation  ONSET DATE: 06/02/2022  SUBJECTIVE:                                                                                                                                                                                                         SUBJECTIVE STATEMENT: Pt reporting that is overall doing well. Little pain, continues to have pain when turning over L shoulder.   EVAL: Pt was standing on a chair and fell backwards off the chair landing on the ground hitting the back of her head and neck.  Pt used ice and advil.  She started getting a H/A.   Pt went to ER and had CT showing no acute intracranial abnormality though had an acute fx of anterior superior endplate of T1 and likely moderate spinal canal stenosis at C5-6.  Trace anterolisthesis of C7 on T1.  MD recommended MRI and placed pt in a cervical brace/collar.  Pt went to [facility] ER and had a MRI showed a wedge T1 compression fx with bone marrow edema with no associated ligamentous injury.  Pt was referred to neurosurgery.  Pt saw neurosurgeon on 5/1 and note indicated MD removed the brace, activities as tolerated, and PT for cervical stenosis. Pt wore a cervical brace for 12-14 days.   "I feel great, just a little sore".  Pt states she doesn't have much pain, just more discomfort.  Pt reports stiffness and limited mobility in cervical including turning head.  Pt is limited with her normal lifting.  She does have some stiffness 1st thing in  AM.   She is sleeping ok.  Pt uses a heating pad which helps mobility and pain.  She can have  occasional shooting pain when turning head to the right which goes away with returning to neutral.  Pt states she is able to perform her household chores and work activities.   Hand dominance: Right  PERTINENT HISTORY:  T1 Compression Fracture on 06/02/22 Per dx and CT/MRI findings  PMHx:  Arthritis, osteopenia, R BBB, R foot surgery in 2009, L knee surgery 2016, ORIF L elbow fx in 2016  PAIN:  Are you having pain? Yes Location:  R sided cervical, R sided upper to mid thoracic, R medial scap 4/10 pain currently.  Pt states she has been in a lot of meetings today.   5/10 worst pain, 0/10 best pain  PRECAUTIONS: Other: T1 compression fracture  WEIGHT BEARING RESTRICTIONS: none indicated on MD order  FALLS:  Has patient fallen in last 6 months? Yes. 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  PLOF: Independent.  Pt was able to perform all of her daily activities without cervical pain.  Pt was able to turn her head without difficulty.   PATIENT GOALS: to be able to turn her neck   OBJECTIVE:   DIAGNOSTIC FINDINGS:  IMPRESSION: CT HEAD:   1. No acute intracranial abnormality. 2. Mild soft tissue swelling along the right parietal scalp.   CT CERVICAL SPINE:  -Trace anterolisthesis of C7 on T1.  1. Acute fracture through the anterior superior endplate of T1. Recommend further evaluation with a cervical spine MRI to exclude the possibility of ligamentous injury. 2. Likely moderate spinal canal stenosis at C5-C6.  MR Cervical Spine Wo Contrast 1. Mild T1 wedge compression fracture with bone marrow edema underlying the superior endplate. No associated ligamentous injury. 2. Right facet edema at C7-T1, which may be a source of local neck pain. 3. Moderate C6-7 spinal canal stenosis with severe bilateral, right worse than left neural foraminal stenosis. 4. Mild spinal canal stenosis at C4-5 and  C5-6 with moderate right and severe left C4-5 neural foraminal stenosis.  PATIENT SURVEYS:  FOTO 59 with a goal of 69 at visit #10  COGNITION: Overall cognitive status: Within functional limits for tasks assessed   POSTURE:  Pt has good standing and seated posture.  Pt does not have forward head posture.    PALPATION: Pt is more tender to palpate R > L cervical paraspinals and very tender at R > L mid thoracic paraspinals and medial scap mm.  She has tenderness in bilat UT.  Tender at T1  Pt has moderate tightness in bilat UT.  Pt has soft tissue tightness in R>L medial scap mm.  CERVICAL ROM:   Active ROM A/PROM (deg) eval  Flexion   Extension   Right lateral flexion 17  Left lateral flexion 8  Right rotation 34  Left rotation 25   (Blank rows = not tested)  UPPER EXTREMITY ROM:  Active ROM Right eval Left eval  Shoulder flexion 168 156  Shoulder extension    Shoulder abduction    Shoulder adduction    Shoulder extension    Shoulder internal rotation    Shoulder external rotation    Elbow flexion    Elbow extension    Wrist flexion    Wrist extension    Wrist ulnar deviation    Wrist radial deviation    Wrist  pronation    Wrist supination     (Blank rows = not tested)     TODAY'S TREATMENT:                                                                                                                               07/22/2022  Supine -STM muscle tension release w/ heavy emphasis on L trapezius and cervical rotators. ~10 minutes -10x Left cervical active rotation to painfree range with manual pressure applied distal L upper trapezius -5x 10 seconds deep neck flexion holds.  Seated -L active ROM with manual pressure applied distal left trapezius and levator scapulae x 10 -Scapular adduction x 10 RTB  STM to cervical paraspinals bilaterally, sub occipital STM/release (supine) STM to thoracic paraspinals and scapular mm. In seated position   Rhomboid  stretch at doorway- 15 seconds x3  PATIENT EDUCATION:  Education details: objective findings, posture, restrictions relating to compression fx, prognsosis, relevant anatomy, rationale of interventions, and POC.  PT answered pt's questions.   Person educated: Patient Education method: Medical illustrator Education comprehension: verbalized understanding  HOME EXERCISE PROGRAM: Will give at a later date  ASSESSMENT:  CLINICAL IMPRESSION: Pt tolerating session well. Focus on STM while encouraging active rotation to L side. Continues to show hesitancy with end range cervical rotation in part to continue muscular guarding. Increased tension through L Trapezius and L levator scapule. Given increased mobility and deep neck flexor for HEP.Marland Kitchen Pt will continue to benefit from skilled PT services to address functional limitations and improve overall QOL.   OBJECTIVE IMPAIRMENTS: decreased activity tolerance, decreased ROM, decreased strength, hypomobility, increased fascial restrictions, increased muscle spasms, and pain.   ACTIVITY LIMITATIONS: carrying and lifting  PARTICIPATION LIMITATIONS: driving and shopping  PERSONAL FACTORS: 1-2 comorbidities: T1 compression fracture and arthritis  are also affecting patient's functional outcome.   REHAB POTENTIAL: Good  CLINICAL DECISION MAKING: Stable/uncomplicated  EVALUATION COMPLEXITY: Low   GOALS:  SHORT TERM GOALS: Target date: 08/03/2022   Pt will report at least a 25% improvement in stiffness and turning her head for improved daily mobility.  Baseline:  Goal status: INITIAL  2.  Pt will demo at least a 6-8 deg increase in cervical Sb'ing AROM and 10 deg in cervical rotation AROM bilat for improved stiffness and daily mobility.  Baseline:  Goal status: INITIAL   LONG TERM GOALS: Target date: 08/31/2022  Pt will be able to turn her head while driving without significant pain and difficulty.  Baseline:  Goal status:  INITIAL  2.  Pt will be independent with HEP for improved pain, ROM, and function.   Baseline:  Goal status: INITIAL  3.  Pt will be able to perform her ADLs and IADLs without significant pain and difficulty.  Baseline:  Goal status: INITIAL  4.  Pt will demo at least 55 deg of cervical rotation AROM bilat for improved stiffness and daily mobility. Baseline:  Goal status: INITIAL  PLAN:  PT FREQUENCY:  1x/wk with progression to 2x/wk as needed  PT DURATION: 8 weeks  PLANNED INTERVENTIONS: Therapeutic exercises, Therapeutic activity, Neuromuscular re-education, Patient/Family education, Self Care, Joint mobilization, Aquatic Therapy, Dry Needling, Electrical stimulation, Cryotherapy, Moist heat, Taping, Ultrasound, Manual therapy, and Re-evaluation  PLAN FOR NEXT SESSION: Cont with ther ex and STW per healing considerations per T1 compression fracture   Riki Altes, PTA  07/22/22 12:13 PM

## 2022-07-26 NOTE — Therapy (Signed)
OUTPATIENT PHYSICAL THERAPY CERVICAL TREATMENT   Patient Name: Amber Klein MRN: [id number] DOB:[birth year], 72 y.o., female Today's Date: 07/28/2022  END OF SESSION:  PT End of Session - 07/27/22 0905     Visit Number 4    Number of Visits 10    Date for PT Re-Evaluation 08/31/22    Authorization Type UHC    PT Start Time 0856    PT Stop Time 0940    PT Time Calculation (min) 44 min    Activity Tolerance Patient tolerated treatment well;No increased pain    Behavior During Therapy WFL for tasks assessed/performed                Past Medical History:  Diagnosis Date   Arthritis    Asthma    mild - allergy season induced   Cancer (HCC)    basal cell skin cancer   Complication of anesthesia    Sensitive to propofol - and BP drops   Dysrhythmia    Ear infection    Environmental and seasonal allergies    Femoral hernia of right side with obstruction 01/18/2018   GERD (gastroesophageal reflux disease)    no longer having issues takes TUMS occ.   History of kidney stones    Hypotension    Irritant contact dermatitis associated with stoma 06/25/2020   Pneumonia 02/2016   walking pneumonia    RBBB    Sinus infection    Past Surgical History:  Procedure Laterality Date   CATARACT EXTRACTION     CESAREAN SECTION  1979   1985   CO2 LASER APPLICATION N/A 05/10/2015   Procedure: CO2 LASER APPLICATION;  Surgeon: Waynard Reeds, MD;  Location: [facility];  Service: Gynecology;  Laterality: N/A;   COLON SURGERY  04/10/2020   with sepsis and colostomy   COLONOSCOPY     COLONOSCOPY WITH PROPOFOL N/A 11/11/2020   Procedure: COLONOSCOPY WITH PROPOFOL;  Surgeon: Rachael Fee, MD;  Location: [facility];  Service: Endoscopy;  Laterality: N/A;  NEEDS PREVISIT WITH ANESTHESIA   FOOT SURGERY Right 2009   INCISIONAL HERNIA REPAIR N/A 04/06/2021   Procedure: HERNIA REPAIR INCISIONAL REPAIR WITH MESH;  Surgeon: Axel Filler, MD;  Location: [facility] OR;  Service: General;   Laterality: N/A;   INGUINAL HERNIA REPAIR Right 01/18/2018   Procedure: OPEN REPAIR INCARCERATED RIGHT FEMORAL HERNIA WITH MESH;  Surgeon: Claud Kelp, MD;  Location: [facility] OR;  Service: General;  Laterality: Right;  GENERAL / TAP BLOCK   INSERTION OF MESH N/A 04/06/2021   Procedure: INSERTION OF MESH;  Surgeon: Axel Filler, MD;  Location: [facility] OR;  Service: General;  Laterality: N/A;   KNEE SURGERY Left 2016   LAPAROTOMY N/A 04/06/2021   Procedure: EXPLORATORY LAPAROTOMY;  Surgeon: Axel Filler, MD;  Location: [facility] OR;  Service: General;  Laterality: N/A;   LYSIS OF ADHESION N/A 04/06/2021   Procedure: LYSIS OF ADHESION;  Surgeon: Axel Filler, MD;  Location: [facility] OR;  Service: General;  Laterality: N/A;   ORIF ELBOW FRACTURE Left 07/04/2014   Procedure: OPEN REDUCTION INTERNAL FIXATION (ORIF) ELBOW/OLECRANON FRACTURE;  Surgeon: Cammy Copa, MD;  Location: [facility];  Service: Orthopedics;  Laterality: Left;   STOMA DILATATION N/A 12/08/2020   Procedure: STOMA DILATATION WITH STOMAPLASTY;  Surgeon: Luretha Murphy, MD;  Location: [facility];  Service: General;  Laterality: N/A;   VULVECTOMY N/A 08/10/2016   Procedure: WIDE EXCISION VULVECTOMY;  Surgeon: Waynard Reeds, MD;  Location: [facility];  Service: Gynecology;  Laterality: N/A;  XI ROBOTIC ASSISTED COLOSTOMY TAKEDOWN N/A 01/05/2021   Procedure: ROBOTIC COLOSTOMY REVERSAL WITH LAPAROSCOPIC LYSIS OF ADHESIONS;  Surgeon: Romie Levee, MD;  Location: [facility];  Service: General;  Laterality: N/A;   Patient Active Problem List   Diagnosis Date Noted   S/P hernia repair 04/06/2021   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   Colonic obstruction (HCC) 12/08/2020   Diverticulosis of colon 10/08/2020   Intractable vomiting 07/06/2020   Hypoxia 07/06/2020   Irritant contact dermatitis associated with stoma 06/25/2020   Diverticulitis 05/21/2020   GERD (gastroesophageal reflux disease)    Acute suppurative peritonitis (HCC) 04/11/2020    Perforated sigmoid colon (HCC) 04/10/2020   Other constipation 04/06/2020   Femoral hernia of right side with obstruction 01/18/2018   Elbow fracture 07/04/2014     REFERRING PROVIDER: Julio Sicks, MD  REFERRING DIAG: [phone] (ICD-10-CM) - Spinal stenosis, cervical region  THERAPY DIAG:  Cervicalgia  Pain in thoracic spine  Stiffness of unspecified joint, not elsewhere classified  Rationale for Evaluation and Treatment: Rehabilitation  ONSET DATE: 06/02/2022  SUBJECTIVE:                                                                                                                                                                                                         SUBJECTIVE STATEMENT: Pt is 7 weeks and 6 days s/p T1 compression fx.  Pt denies any adverse effects after prior Rx.  Pt is using the heat pad at night and is sleeping better.  Pt states she has a nagging ache, not really painful sitting at rest.  Pt reports 3-4/10 thoracic pain L > R and 3/10 cervical pain with movement.    Hand dominance: Right  PERTINENT HISTORY:  T1 Compression Fracture on 06/02/22 Per dx and CT/MRI findings  PMHx:  Arthritis, osteopenia, R BBB, R foot surgery in 2009, L knee surgery 2016, ORIF L elbow fx in 2016  PAIN:  Are you having pain? Yes See above  PRECAUTIONS: Other: T1 compression fracture  WEIGHT BEARING RESTRICTIONS: none indicated on MD order  FALLS:  Has patient fallen in last 6 months? Yes. 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  PLOF: Independent.  Pt was able to perform all of her daily activities without cervical pain.  Pt was able to turn her head without difficulty.   PATIENT GOALS: to be able to turn her neck   OBJECTIVE:   DIAGNOSTIC FINDINGS:  IMPRESSION: CT HEAD:   1. No acute  intracranial abnormality. 2. Mild soft tissue swelling along the right parietal scalp.   CT  CERVICAL SPINE:  -Trace anterolisthesis of C7 on T1.  1. Acute fracture through the anterior superior endplate of T1. Recommend further evaluation with a cervical spine MRI to exclude the possibility of ligamentous injury. 2. Likely moderate spinal canal stenosis at C5-C6.  MR Cervical Spine Wo Contrast 1. Mild T1 wedge compression fracture with bone marrow edema underlying the superior endplate. No associated ligamentous injury. 2. Right facet edema at C7-T1, which may be a source of local neck pain. 3. Moderate C6-7 spinal canal stenosis with severe bilateral, right worse than left neural foraminal stenosis. 4. Mild spinal canal stenosis at C4-5 and C5-6 with moderate right and severe left C4-5 neural foraminal stenosis.   TODAY'S TREATMENT:    Therapeutic Exercise:  Assessed cervical AROM. CERVICAL ROM:   Active ROM A/PROM (deg) eval AROM 6/13  Flexion    Extension    Right lateral flexion 17 20  Left lateral flexion 8 15 with pain  Right rotation 34 66  Left rotation 25 45   (Blank rows = not tested)               Pt was educated in correct seated posture and performance of TrA contraction.  Pt performed seated TrA contraction with 3 sec hold with cuing and focus on maintaining correct posture.  Pt received a HEP handout and was educated in correct form and appropriate frequency.                                                                                                               Manual Therapy: STM to cervical paraspinals bilaterally, sub occipital, and bilat UT in supine STM to thoracic paraspinals and scapular mm in seated position      PATIENT EDUCATION:  Education details: objective findings, posture, restrictions relating to compression fx, prognsosis, relevant anatomy, rationale of interventions, HEP, and POC.  PT answered pt's questions.   Person educated: Patient Education method: Medical illustrator, verbal cues, and handout Education  comprehension: verbalized understanding, returned demonstration, verbal cues required  HOME EXERCISE PROGRAM: Access Code: [email or web address] URL: [email or web address] Date: 07/28/2022 Prepared by: Aaron Edelman  Exercises - Seated Transversus Abdominis Bracing  - 2-3 x daily - 7 x weekly - 1 sets - 10 reps - 3 seconds hold  ASSESSMENT:  CLINICAL IMPRESSION: Pt reports having pain with movement and states she has pain and limitation with turning to the left.  Pt demonstrates improved cervical SB and rotation AROM bilat having significantly improved rotation AROM.  She has less L rotation AROM than R though has made good progress on both sides.  Pt partially met STG #2.  Pt educated in performing TrA contraction with cues for focusing on correct seated posture.  Pt given HEP handout and pt demonstrates good understanding.  Pt has soft tissue tightness and  tenderness in R sided cervical paraspinals, bilat horacic paraspinals, bilat UT, and medial scap mm.  Trigger points were present  in bilat thoracic paraspinals and UT.  PT performed STM to improve tightness, pain, and mobility.  Pt responded well to Rx having no increased pain after Rx.  She should continue to benefit from continued skilled PT services to address impairments and goals and to improve overall function.    OBJECTIVE IMPAIRMENTS: decreased activity tolerance, decreased ROM, decreased strength, hypomobility, increased fascial restrictions, increased muscle spasms, and pain.   ACTIVITY LIMITATIONS: carrying and lifting  PARTICIPATION LIMITATIONS: driving and shopping  PERSONAL FACTORS: 1-2 comorbidities: T1 compression fracture and arthritis  are also affecting patient's functional outcome.   REHAB POTENTIAL: Good  CLINICAL DECISION MAKING: Stable/uncomplicated  EVALUATION COMPLEXITY: Low   GOALS:  SHORT TERM GOALS: Target date: 08/03/2022   Pt will report at least a 25% improvement in stiffness and turning her  head for improved daily mobility.  Baseline:  Goal status: INITIAL  2.  Pt will demo at least a 6-8 deg increase in cervical Sb'ing AROM and 10 deg in cervical rotation AROM bilat for improved stiffness and daily mobility.  Baseline:  Goal status: 75% MET----07/27/22   LONG TERM GOALS: Target date: 08/31/2022  Pt will be able to turn her head while driving without significant pain and difficulty.  Baseline:  Goal status: INITIAL  2.  Pt will be independent with HEP for improved pain, ROM, and function.   Baseline:  Goal status: INITIAL  3.  Pt will be able to perform her ADLs and IADLs without significant pain and difficulty.  Baseline:  Goal status: INITIAL  4.  Pt will demo at least 55 deg of cervical rotation AROM bilat for improved stiffness and daily mobility. Baseline:  Goal status: INITIAL     PLAN:  PT FREQUENCY:  1x/wk with progression to 2x/wk as needed  PT DURATION: 8 weeks  PLANNED INTERVENTIONS: Therapeutic exercises, Therapeutic activity, Neuromuscular re-education, Patient/Family education, Self Care, Joint mobilization, Aquatic Therapy, Dry Needling, Electrical stimulation, Cryotherapy, Moist heat, Taping, Ultrasound, Manual therapy, and Re-evaluation  PLAN FOR NEXT SESSION: Cont with ther ex and STW per healing considerations per T1 compression fracture   Audie Clear III PT, DPT 07/28/22 7:28 AM

## 2022-07-27 ENCOUNTER — Ambulatory Visit (HOSPITAL_BASED_OUTPATIENT_CLINIC_OR_DEPARTMENT_OTHER): Payer: 59 | Admitting: Physical Therapy

## 2022-07-27 DIAGNOSIS — M542 Cervicalgia: Secondary | ICD-10-CM | POA: Diagnosis not present

## 2022-07-27 DIAGNOSIS — M546 Pain in thoracic spine: Secondary | ICD-10-CM

## 2022-07-28 ENCOUNTER — Encounter (HOSPITAL_BASED_OUTPATIENT_CLINIC_OR_DEPARTMENT_OTHER): Payer: Self-pay | Admitting: Physical Therapy

## 2022-08-02 ENCOUNTER — Ambulatory Visit (HOSPITAL_BASED_OUTPATIENT_CLINIC_OR_DEPARTMENT_OTHER): Payer: 59

## 2022-08-02 ENCOUNTER — Encounter (HOSPITAL_BASED_OUTPATIENT_CLINIC_OR_DEPARTMENT_OTHER): Payer: Self-pay

## 2022-08-02 DIAGNOSIS — M542 Cervicalgia: Secondary | ICD-10-CM | POA: Diagnosis not present

## 2022-08-02 DIAGNOSIS — M546 Pain in thoracic spine: Secondary | ICD-10-CM

## 2022-08-02 NOTE — Therapy (Signed)
OUTPATIENT PHYSICAL THERAPY CERVICAL TREATMENT   Patient Name: Amber Klein MRN: [id number] DOB:May 12, 1950, 72 y.o., female Today's Date: 08/02/2022  END OF SESSION:  PT End of Session - 08/02/22 0753     Visit Number 5    Number of Visits 10    Date for PT Re-Evaluation 08/31/22    Authorization Type [employer]    PT Start Time 0802    PT Stop Time 0845    PT Time Calculation (min) 43 min    Activity Tolerance Patient tolerated treatment well    Behavior During Therapy WFL for tasks assessed/performed                Past Medical History:  Diagnosis Date   Arthritis    Asthma    mild - allergy season induced   Cancer (HCC)    basal cell skin cancer   Complication of anesthesia    Sensitive to propofol - and BP drops   Dysrhythmia    Ear infection    Environmental and seasonal allergies    Femoral hernia of right side with obstruction 01/18/2018   GERD (gastroesophageal reflux disease)    no longer having issues takes TUMS occ.   History of kidney stones    Hypotension    Irritant contact dermatitis associated with stoma 06/25/2020   Pneumonia 02/2016   walking pneumonia    RBBB    Sinus infection    Past Surgical History:  Procedure Laterality Date   CATARACT EXTRACTION     CESAREAN SECTION  1979   1985   CO2 LASER APPLICATION N/A 05/10/2015   Procedure: CO2 LASER APPLICATION;  Surgeon: Waynard Reeds, MD;  Location: [facility];  Service: Gynecology;  Laterality: N/A;   COLON SURGERY  04/10/2020   with sepsis and colostomy   COLONOSCOPY     COLONOSCOPY WITH PROPOFOL N/A 11/11/2020   Procedure: COLONOSCOPY WITH PROPOFOL;  Surgeon: Rachael Fee, MD;  Location: [facility];  Service: Endoscopy;  Laterality: N/A;  NEEDS PREVISIT WITH ANESTHESIA   FOOT SURGERY Right 2009   INCISIONAL HERNIA REPAIR N/A 04/06/2021   Procedure: HERNIA REPAIR INCISIONAL REPAIR WITH MESH;  Surgeon: Axel Filler, MD;  Location: [facility] OR;  Service: General;  Laterality: N/A;    INGUINAL HERNIA REPAIR Right 01/18/2018   Procedure: OPEN REPAIR INCARCERATED RIGHT FEMORAL HERNIA WITH MESH;  Surgeon: Claud Kelp, MD;  Location: [facility] OR;  Service: General;  Laterality: Right;  GENERAL / TAP BLOCK   INSERTION OF MESH N/A 04/06/2021   Procedure: INSERTION OF MESH;  Surgeon: Axel Filler, MD;  Location: [facility] OR;  Service: General;  Laterality: N/A;   KNEE SURGERY Left 2016   LAPAROTOMY N/A 04/06/2021   Procedure: EXPLORATORY LAPAROTOMY;  Surgeon: Axel Filler, MD;  Location: [facility] OR;  Service: General;  Laterality: N/A;   LYSIS OF ADHESION N/A 04/06/2021   Procedure: LYSIS OF ADHESION;  Surgeon: Axel Filler, MD;  Location: [facility] OR;  Service: General;  Laterality: N/A;   ORIF ELBOW FRACTURE Left 07/04/2014   Procedure: OPEN REDUCTION INTERNAL FIXATION (ORIF) ELBOW/OLECRANON FRACTURE;  Surgeon: Cammy Copa, MD;  Location: [facility];  Service: Orthopedics;  Laterality: Left;   STOMA DILATATION N/A 12/08/2020   Procedure: STOMA DILATATION WITH STOMAPLASTY;  Surgeon: Luretha Murphy, MD;  Location: [facility];  Service: General;  Laterality: N/A;   VULVECTOMY N/A 08/10/2016   Procedure: WIDE EXCISION VULVECTOMY;  Surgeon: Waynard Reeds, MD;  Location: [facility];  Service: Gynecology;  Laterality: N/A;  XI ROBOTIC ASSISTED COLOSTOMY TAKEDOWN N/A 01/05/2021   Procedure: ROBOTIC COLOSTOMY REVERSAL WITH LAPAROSCOPIC LYSIS OF ADHESIONS;  Surgeon: Romie Levee, MD;  Location: [facility];  Service: General;  Laterality: N/A;   Patient Active Problem List   Diagnosis Date Noted   S/P hernia repair 04/06/2021   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   Colonic obstruction (HCC) 12/08/2020   Diverticulosis of colon 10/08/2020   Intractable vomiting 07/06/2020   Hypoxia 07/06/2020   Irritant contact dermatitis associated with stoma 06/25/2020   Diverticulitis 05/21/2020   GERD (gastroesophageal reflux disease)    Acute suppurative peritonitis (HCC) 04/11/2020   Perforated sigmoid colon  (HCC) 04/10/2020   Other constipation 04/06/2020   Femoral hernia of right side with obstruction 01/18/2018   Elbow fracture 07/04/2014     REFERRING PROVIDER: Julio Sicks, MD  REFERRING DIAG: [phone] (ICD-10-CM) - Spinal stenosis, cervical region  THERAPY DIAG:  Cervicalgia  Pain in thoracic spine  Stiffness of unspecified joint, not elsewhere classified  Rationale for Evaluation and Treatment: Rehabilitation  ONSET DATE: 06/02/2022  SUBJECTIVE:                                                                                                                                                                                                         SUBJECTIVE STATEMENT: Pt reports improved head turning to the R, but still has pain and difficulty with turning head to the L.   Hand dominance: Right  PERTINENT HISTORY:  T1 Compression Fracture on 06/02/22 Per dx and CT/MRI findings  PMHx:  Arthritis, osteopenia, R BBB, R foot surgery in 2009, L knee surgery 2016, ORIF L elbow fx in 2016  PAIN:  Are you having pain? Yes See above  PRECAUTIONS: Other: T1 compression fracture  WEIGHT BEARING RESTRICTIONS: none indicated on MD order  FALLS:  Has patient fallen in last 6 months? Yes. 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  PLOF: Independent.  Pt was able to perform all of her daily activities without cervical pain.  Pt was able to turn her head without difficulty.   PATIENT GOALS: to be able to turn her neck   OBJECTIVE:   DIAGNOSTIC FINDINGS:  IMPRESSION: CT HEAD:   1. No acute intracranial abnormality. 2. Mild soft tissue swelling along the right parietal scalp.   CT CERVICAL SPINE:  -Trace anterolisthesis of C7 on T1.  1. Acute fracture through the anterior superior endplate of T1. Recommend further evaluation with a cervical spine MRI  to exclude the possibility of ligamentous  injury. 2. Likely moderate spinal canal stenosis at C5-C6.  MR Cervical Spine Wo Contrast 1. Mild T1 wedge compression fracture with bone marrow edema underlying the superior endplate. No associated ligamentous injury. 2. Right facet edema at C7-T1, which may be a source of local neck pain. 3. Moderate C6-7 spinal canal stenosis with severe bilateral, right worse than left neural foraminal stenosis. 4. Mild spinal canal stenosis at C4-5 and C5-6 with moderate right and severe left C4-5 neural foraminal stenosis.   Active ROM A/PROM (deg) eval AROM 6/13  Flexion    Extension    Right lateral flexion 17 20  Left lateral flexion 8 15 with pain  Right rotation 34 66  Left rotation 25 45   (Blank rows = not tested) TODAY'S TREATMENT:     Manual Therapy: STM to cervical paraspinals bilaterally, sub occipital in supine  STM to thoracic paraspinals and scapular mm, UT, levator, pec in seated position   Therex:  Row with RTB x20 Extension with RTB x20 Bilateral ER with RTB 2x10 Doorway pec stretch 15s x3 Levator stretch 20sec x3 L    PATIENT EDUCATION:  Education details: objective findings, posture, restrictions relating to compression fx, prognsosis, relevant anatomy, rationale of interventions, HEP, and POC.  PT answered pt's questions.   Person educated: Patient Education method: Medical illustrator, verbal cues, and handout Education comprehension: verbalized understanding, returned demonstration, verbal cues required  HOME EXERCISE PROGRAM: Access Code: [email or web address] URL: [email or web address] Date: 07/28/2022 Prepared by: Aaron Edelman  Exercises - Seated Transversus Abdominis Bracing  - 2-3 x daily - 7 x weekly - 1 sets - 10 reps - 3 seconds hold  ASSESSMENT:  CLINICAL IMPRESSION: Pt continues with significant soft tissue restrictions and trigger points within bilateral shoulder girdle and cervical region, though worse on L side. Pt  especially tight into L levator scapulae. She had overall good tolerance to stretching program with cues to remain within pain limitations. Continued to work on manual techniques to improve restrictions, which she reports benefit from.  Pt is a good candidate for DN, so will trial this next visit to reduce restrictions and improve cervical mobility.   OBJECTIVE IMPAIRMENTS: decreased activity tolerance, decreased ROM, decreased strength, hypomobility, increased fascial restrictions, increased muscle spasms, and pain.   ACTIVITY LIMITATIONS: carrying and lifting  PARTICIPATION LIMITATIONS: driving and shopping  PERSONAL FACTORS: 1-2 comorbidities: T1 compression fracture and arthritis  are also affecting patient's functional outcome.   REHAB POTENTIAL: Good  CLINICAL DECISION MAKING: Stable/uncomplicated  EVALUATION COMPLEXITY: Low   GOALS:  SHORT TERM GOALS: Target date: 08/03/2022   Pt will report at least a 25% improvement in stiffness and turning her head for improved daily mobility.  Baseline:  Goal status: INITIAL  2.  Pt will demo at least a 6-8 deg increase in cervical Sb'ing AROM and 10 deg in cervical rotation AROM bilat for improved stiffness and daily mobility.  Baseline:  Goal status: 75% MET----07/27/22   LONG TERM GOALS: Target date: 08/31/2022  Pt will be able to turn her head while driving without significant pain and difficulty.  Baseline:  Goal status: INITIAL  2.  Pt will be independent with HEP for improved pain, ROM, and function.   Baseline:  Goal status: INITIAL  3.  Pt will be able to perform her ADLs and IADLs without significant pain and difficulty.  Baseline:  Goal status: INITIAL  4.  Pt will demo at least 55 deg of cervical  rotation AROM bilat for improved stiffness and daily mobility. Baseline:  Goal status: INITIAL     PLAN:  PT FREQUENCY:  1x/wk with progression to 2x/wk as needed  PT DURATION: 8 weeks  PLANNED INTERVENTIONS:  Therapeutic exercises, Therapeutic activity, Neuromuscular re-education, Patient/Family education, Self Care, Joint mobilization, Aquatic Therapy, Dry Needling, Electrical stimulation, Cryotherapy, Moist heat, Taping, Ultrasound, Manual therapy, and Re-evaluation  PLAN FOR NEXT SESSION: Cont with ther ex and STW per healing considerations per T1 compression fracture   Riki Altes, PTA  08/02/22 9:28 AM

## 2022-08-09 ENCOUNTER — Ambulatory Visit (HOSPITAL_BASED_OUTPATIENT_CLINIC_OR_DEPARTMENT_OTHER): Payer: 59 | Admitting: Physical Therapy

## 2022-08-24 ENCOUNTER — Encounter (HOSPITAL_BASED_OUTPATIENT_CLINIC_OR_DEPARTMENT_OTHER): Payer: Self-pay

## 2022-08-24 ENCOUNTER — Ambulatory Visit (HOSPITAL_BASED_OUTPATIENT_CLINIC_OR_DEPARTMENT_OTHER): Payer: 59 | Attending: Neurosurgery

## 2022-08-24 DIAGNOSIS — M542 Cervicalgia: Secondary | ICD-10-CM | POA: Diagnosis present

## 2022-08-24 DIAGNOSIS — M546 Pain in thoracic spine: Secondary | ICD-10-CM | POA: Insufficient documentation

## 2022-08-24 NOTE — Therapy (Signed)
OUTPATIENT PHYSICAL THERAPY CERVICAL TREATMENT   Patient Name: Amber Klein MRN: [id number] DOB:11-Jul-1950, 72 y.o., female Today's Date: 08/24/2022  END OF SESSION:  PT End of Session - 08/24/22 1358     Visit Number 6    Number of Visits 10    Date for PT Re-Evaluation 08/31/22    Authorization Type U[employer]    PT Start Time 1340    PT Stop Time 1425    PT Time Calculation (min) 45 min    Activity Tolerance Patient tolerated treatment well    Behavior During Therapy WFL for tasks assessed/performed                 Past Medical History:  Diagnosis Date   Arthritis    Asthma    mild - allergy season induced   Cancer (HCC)    basal cell skin cancer   Complication of anesthesia    Sensitive to propofol - and BP drops   Dysrhythmia    Ear infection    Environmental and seasonal allergies    Femoral hernia of right side with obstruction 01/18/2018   GERD (gastroesophageal reflux disease)    no longer having issues takes TUMS occ.   History of kidney stones    Hypotension    Irritant contact dermatitis associated with stoma 06/25/2020   Pneumonia 02/2016   walking pneumonia    RBBB    Sinus infection    Past Surgical History:  Procedure Laterality Date   CATARACT EXTRACTION     CESAREAN SECTION  1979   1985   CO2 LASER APPLICATION N/A 05/10/2015   Procedure: CO2 LASER APPLICATION;  Surgeon: Waynard Reeds, MD;  Location: [facility];  Service: Gynecology;  Laterality: N/A;   COLON SURGERY  04/10/2020   with sepsis and colostomy   COLONOSCOPY     COLONOSCOPY WITH PROPOFOL N/A 11/11/2020   Procedure: COLONOSCOPY WITH PROPOFOL;  Surgeon: Rachael Fee, MD;  Location: [facility];  Service: Endoscopy;  Laterality: N/A;  NEEDS PREVISIT WITH ANESTHESIA   FOOT SURGERY Right 2009   INCISIONAL HERNIA REPAIR N/A 04/06/2021   Procedure: HERNIA REPAIR INCISIONAL REPAIR WITH MESH;  Surgeon: Axel Filler, MD;  Location: [facility] OR;  Service: General;  Laterality: N/A;    INGUINAL HERNIA REPAIR Right 01/18/2018   Procedure: OPEN REPAIR INCARCERATED RIGHT FEMORAL HERNIA WITH MESH;  Surgeon: Claud Kelp, MD;  Location: [facility] OR;  Service: General;  Laterality: Right;  GENERAL / TAP BLOCK   INSERTION OF MESH N/A 04/06/2021   Procedure: INSERTION OF MESH;  Surgeon: Axel Filler, MD;  Location: [facility] OR;  Service: General;  Laterality: N/A;   KNEE SURGERY Left 2016   LAPAROTOMY N/A 04/06/2021   Procedure: EXPLORATORY LAPAROTOMY;  Surgeon: Axel Filler, MD;  Location: [facility] OR;  Service: General;  Laterality: N/A;   LYSIS OF ADHESION N/A 04/06/2021   Procedure: LYSIS OF ADHESION;  Surgeon: Axel Filler, MD;  Location: [facility] OR;  Service: General;  Laterality: N/A;   ORIF ELBOW FRACTURE Left 07/04/2014   Procedure: OPEN REDUCTION INTERNAL FIXATION (ORIF) ELBOW/OLECRANON FRACTURE;  Surgeon: Cammy Copa, MD;  Location: [facility];  Service: Orthopedics;  Laterality: Left;   STOMA DILATATION N/A 12/08/2020   Procedure: STOMA DILATATION WITH STOMAPLASTY;  Surgeon: Luretha Murphy, MD;  Location: [facility];  Service: General;  Laterality: N/A;   VULVECTOMY N/A 08/10/2016   Procedure: WIDE EXCISION VULVECTOMY;  Surgeon: Waynard Reeds, MD;  Location: [facility];  Service: Gynecology;  Laterality: N/A;  XI ROBOTIC ASSISTED COLOSTOMY TAKEDOWN N/A 01/05/2021   Procedure: ROBOTIC COLOSTOMY REVERSAL WITH LAPAROSCOPIC LYSIS OF ADHESIONS;  Surgeon: Romie Levee, MD;  Location: [facility];  Service: General;  Laterality: N/A;   Patient Active Problem List   Diagnosis Date Noted   S/P hernia repair 04/06/2021   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   Colonic obstruction (HCC) 12/08/2020   Diverticulosis of colon 10/08/2020   Intractable vomiting 07/06/2020   Hypoxia 07/06/2020   Irritant contact dermatitis associated with stoma 06/25/2020   Diverticulitis 05/21/2020   GERD (gastroesophageal reflux disease)    Acute suppurative peritonitis (HCC) 04/11/2020   Perforated sigmoid colon  (HCC) 04/10/2020   Other constipation 04/06/2020   Femoral hernia of right side with obstruction 01/18/2018   Elbow fracture 07/04/2014     REFERRING PROVIDER: Julio Sicks, MD  REFERRING DIAG: [phone] (ICD-10-CM) - Spinal stenosis, cervical region  THERAPY DIAG:  Cervicalgia  Pain in thoracic spine  Stiffness of unspecified joint, not elsewhere classified  Rationale for Evaluation and Treatment: Rehabilitation  ONSET DATE: 06/02/2022  SUBJECTIVE:                                                                                                                                                                                                         SUBJECTIVE STATEMENT: Pt reports she has not been able to get into PT. Is looking forward to DN, which is scheduled for Monday. "I don't feel like I've had any major setback." 4/10 pain level at entry with cervical rotation.    Hand dominance: Right  PERTINENT HISTORY:  T1 Compression Fracture on 06/02/22 Per dx and CT/MRI findings  PMHx:  Arthritis, osteopenia, R BBB, R foot surgery in 2009, L knee surgery 2016, ORIF L elbow fx in 2016  PAIN:  Are you having pain? Yes 4/10 See above  PRECAUTIONS: Other: T1 compression fracture  WEIGHT BEARING RESTRICTIONS: none indicated on MD order  FALLS:  Has patient fallen in last 6 months? Yes. 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  PLOF: Independent.  Pt was able to perform all of her daily activities without cervical pain.  Pt was able to turn her head without difficulty.   PATIENT GOALS: to be able to turn her neck   OBJECTIVE:   FOTO 7/11: 59% (same as IE. Goal of 69%)  DIAGNOSTIC FINDINGS:  IMPRESSION: CT HEAD:   1. No acute intracranial abnormality. 2. Mild soft tissue swelling along the right parietal scalp.  CT CERVICAL SPINE:  -Trace anterolisthesis of C7 on T1.  1. Acute fracture  through the anterior superior endplate of T1. Recommend further evaluation with a cervical spine MRI to exclude the possibility of ligamentous injury. 2. Likely moderate spinal canal stenosis at C5-C6.  MR Cervical Spine Wo Contrast 1. Mild T1 wedge compression fracture with bone marrow edema underlying the superior endplate. No associated ligamentous injury. 2. Right facet edema at C7-T1, which may be a source of local neck pain. 3. Moderate C6-7 spinal canal stenosis with severe bilateral, right worse than left neural foraminal stenosis. 4. Mild spinal canal stenosis at C4-5 and C5-6 with moderate right and severe left C4-5 neural foraminal stenosis.   Active ROM A/PROM (deg) eval AROM 6/13  Flexion    Extension    Right lateral flexion 17 20  Left lateral flexion 8 15 with pain  Right rotation 34 66  Left rotation 25 45   (Blank rows = not tested) TODAY'S TREATMENT:     Manual Therapy: STM to cervical paraspinals bilaterally, sub occipital in supine  STM to thoracic paraspinals and scapular mm, UT, levator in seated position   Therex:  Row with GTB x20 Extension with GTB x20 Bilateral ER with RTB 2x10 Supine horizontal abduction 2x10 RTB Levator stretch 30sec x3 bil Posterior shoulder rolls 2x15   PATIENT EDUCATION:  Education details: objective findings, posture, restrictions relating to compression fx, prognsosis, relevant anatomy, rationale of interventions, HEP, and POC.  PT answered pt's questions.   Person educated: Patient Education method: Medical illustrator, verbal cues, and handout Education comprehension: verbalized understanding, returned demonstration, verbal cues required  HOME EXERCISE PROGRAM: Access Code: [email or web address] URL: [email or web address] Date: 07/28/2022 Prepared by: Aaron Edelman  Exercises - Seated Transversus Abdominis Bracing  - 2-3 x daily - 7 x weekly - 1 sets - 10 reps - 3 seconds  hold  ASSESSMENT:  CLINICAL IMPRESSION: FOTO score unchanged from IE. Pt continues with significant soft tissue restrictions and trigger points within bilateral shoulder girdle and cervical region, though worse on L side. Pt especially tight into L levator scapulae and L cervical paraspinals. Spent time on manual interventions to address these restrictions. Good tolerance for postural re-ed and strengthening. Pt will benefit from DN.   OBJECTIVE IMPAIRMENTS: decreased activity tolerance, decreased ROM, decreased strength, hypomobility, increased fascial restrictions, increased muscle spasms, and pain.   ACTIVITY LIMITATIONS: carrying and lifting  PARTICIPATION LIMITATIONS: driving and shopping  PERSONAL FACTORS: 1-2 comorbidities: T1 compression fracture and arthritis  are also affecting patient's functional outcome.   REHAB POTENTIAL: Good  CLINICAL DECISION MAKING: Stable/uncomplicated  EVALUATION COMPLEXITY: Low   GOALS:  SHORT TERM GOALS: Target date: 08/03/2022   Pt will report at least a 25% improvement in stiffness and turning her head for improved daily mobility.  Baseline:  Goal status: INITIAL  2.  Pt will demo at least a 6-8 deg increase in cervical Sb'ing AROM and 10 deg in cervical rotation AROM bilat for improved stiffness and daily mobility.  Baseline:  Goal status: 75% MET----07/27/22   LONG TERM GOALS: Target date: 08/31/2022  Pt will be able to turn her head while driving without significant pain and difficulty.  Baseline:  Goal status: INITIAL  2.  Pt will be independent with HEP for improved pain, ROM, and function.   Baseline:  Goal status: INITIAL  3.  Pt will be able to perform her ADLs and IADLs without significant pain and difficulty.  Baseline:  Goal status: INITIAL  4.  Pt will demo at least 55 deg of cervical rotation AROM bilat for improved stiffness and daily mobility. Baseline:  Goal status: INITIAL     PLAN:  PT FREQUENCY:  1x/wk  with progression to 2x/wk as needed  PT DURATION: 8 weeks  PLANNED INTERVENTIONS: Therapeutic exercises, Therapeutic activity, Neuromuscular re-education, Patient/Family education, Self Care, Joint mobilization, Aquatic Therapy, Dry Needling, Electrical stimulation, Cryotherapy, Moist heat, Taping, Ultrasound, Manual therapy, and Re-evaluation  PLAN FOR NEXT SESSION: Cont with ther ex and STW per healing considerations per T1 compression fracture   Riki Altes, PTA  08/24/22 3:34 PM

## 2022-08-28 ENCOUNTER — Ambulatory Visit (HOSPITAL_BASED_OUTPATIENT_CLINIC_OR_DEPARTMENT_OTHER): Payer: 59 | Admitting: Physical Therapy

## 2022-08-28 ENCOUNTER — Encounter (HOSPITAL_BASED_OUTPATIENT_CLINIC_OR_DEPARTMENT_OTHER): Payer: Self-pay | Admitting: Physical Therapy

## 2022-08-28 DIAGNOSIS — M542 Cervicalgia: Secondary | ICD-10-CM

## 2022-08-28 DIAGNOSIS — M546 Pain in thoracic spine: Secondary | ICD-10-CM

## 2022-08-28 NOTE — Therapy (Signed)
OUTPATIENT PHYSICAL THERAPY CERVICAL TREATMENT   Patient Name: Amber Klein MRN: [id number] DOB:[birth year], 72 y.o., female Today's Date: 08/28/2022  END OF SESSION:  PT End of Session - 08/28/22 1404     Visit Number 7    Number of Visits 10    Date for PT Re-Evaluation 08/31/22    Authorization Type UHC    PT Start Time 1402    PT Stop Time 1443    PT Time Calculation (min) 41 min    Activity Tolerance Patient tolerated treatment well    Behavior During Therapy WFL for tasks assessed/performed                 Past Medical History:  Diagnosis Date   Arthritis    Asthma    mild - allergy season induced   Cancer (HCC)    basal cell skin cancer   Complication of anesthesia    Sensitive to propofol - and BP drops   Dysrhythmia    Ear infection    Environmental and seasonal allergies    Femoral hernia of right side with obstruction 01/18/2018   GERD (gastroesophageal reflux disease)    no longer having issues takes TUMS occ.   History of kidney stones    Hypotension    Irritant contact dermatitis associated with stoma 06/25/2020   Pneumonia 02/2016   walking pneumonia    RBBB    Sinus infection    Past Surgical History:  Procedure Laterality Date   CATARACT EXTRACTION     CESAREAN SECTION  1979   1985   CO2 LASER APPLICATION N/A 05/10/2015   Procedure: CO2 LASER APPLICATION;  Surgeon: Waynard Reeds, MD;  Location: [facility];  Service: Gynecology;  Laterality: N/A;   COLON SURGERY  04/10/2020   with sepsis and colostomy   COLONOSCOPY     COLONOSCOPY WITH PROPOFOL N/A 11/11/2020   Procedure: COLONOSCOPY WITH PROPOFOL;  Surgeon: Rachael Fee, MD;  Location: [facility];  Service: Endoscopy;  Laterality: N/A;  NEEDS PREVISIT WITH ANESTHESIA   FOOT SURGERY Right 2009   INCISIONAL HERNIA REPAIR N/A 04/06/2021   Procedure: HERNIA REPAIR INCISIONAL REPAIR WITH MESH;  Surgeon: Axel Filler, MD;  Location: [facility] OR;  Service: General;  Laterality: N/A;    INGUINAL HERNIA REPAIR Right 01/18/2018   Procedure: OPEN REPAIR INCARCERATED RIGHT FEMORAL HERNIA WITH MESH;  Surgeon: Claud Kelp, MD;  Location: [facility] OR;  Service: General;  Laterality: Right;  GENERAL / TAP BLOCK   INSERTION OF MESH N/A 04/06/2021   Procedure: INSERTION OF MESH;  Surgeon: Axel Filler, MD;  Location: [facility] OR;  Service: General;  Laterality: N/A;   KNEE SURGERY Left 2016   LAPAROTOMY N/A 04/06/2021   Procedure: EXPLORATORY LAPAROTOMY;  Surgeon: Axel Filler, MD;  Location: [facility] OR;  Service: General;  Laterality: N/A;   LYSIS OF ADHESION N/A 04/06/2021   Procedure: LYSIS OF ADHESION;  Surgeon: Axel Filler, MD;  Location: [facility] OR;  Service: General;  Laterality: N/A;   ORIF ELBOW FRACTURE Left 07/04/2014   Procedure: OPEN REDUCTION INTERNAL FIXATION (ORIF) ELBOW/OLECRANON FRACTURE;  Surgeon: Cammy Copa, MD;  Location: [facility];  Service: Orthopedics;  Laterality: Left;   STOMA DILATATION N/A 12/08/2020   Procedure: STOMA DILATATION WITH STOMAPLASTY;  Surgeon: Luretha Murphy, MD;  Location: [facility];  Service: General;  Laterality: N/A;   VULVECTOMY N/A 08/10/2016   Procedure: WIDE EXCISION VULVECTOMY;  Surgeon: Waynard Reeds, MD;  Location: [facility];  Service: Gynecology;  Laterality: N/A;  XI ROBOTIC ASSISTED COLOSTOMY TAKEDOWN N/A 01/05/2021   Procedure: ROBOTIC COLOSTOMY REVERSAL WITH LAPAROSCOPIC LYSIS OF ADHESIONS;  Surgeon: Romie Levee, MD;  Location: [facility];  Service: General;  Laterality: N/A;   Patient Active Problem List   Diagnosis Date Noted   S/P hernia repair 04/06/2021   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   Colonic obstruction (HCC) 12/08/2020   Diverticulosis of colon 10/08/2020   Intractable vomiting 07/06/2020   Hypoxia 07/06/2020   Irritant contact dermatitis associated with stoma 06/25/2020   Diverticulitis 05/21/2020   GERD (gastroesophageal reflux disease)    Acute suppurative peritonitis (HCC) 04/11/2020   Perforated sigmoid colon  (HCC) 04/10/2020   Other constipation 04/06/2020   Femoral hernia of right side with obstruction 01/18/2018   Elbow fracture 07/04/2014     REFERRING PROVIDER: Julio Sicks, MD  REFERRING DIAG: [phone] (ICD-10-CM) - Spinal stenosis, cervical region  THERAPY DIAG:  Cervicalgia  Pain in thoracic spine  Stiffness of unspecified joint, not elsewhere classified  Rationale for Evaluation and Treatment: Rehabilitation  ONSET DATE: 06/02/2022  SUBJECTIVE:                                                                                                                                                                                                         SUBJECTIVE STATEMENT: Pt states that the neck felt better after last manual session. Pt states that turning the neck to the L is still painful and "pinches."     Hand dominance: Right  PERTINENT HISTORY:  T1 Compression Fracture on 06/02/22 Per dx and CT/MRI findings  PMHx:  Arthritis, osteopenia, R BBB, R foot surgery in 2009, L knee surgery 2016, ORIF L elbow fx in 2016  PAIN:  Are you having pain? Yes 4/10 See above  PRECAUTIONS: Other: T1 compression fracture  WEIGHT BEARING RESTRICTIONS: none indicated on MD order  FALLS:  Has patient fallen in last 6 months? Yes. 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  PLOF: Independent.  Pt was able to perform all of her daily activities without cervical pain.  Pt was able to turn her head without difficulty.   PATIENT GOALS: to be able to turn her neck   OBJECTIVE:   FOTO 7/11: 59% (same as IE. Goal of 69%)  DIAGNOSTIC FINDINGS:  IMPRESSION: CT HEAD:   1. No acute intracranial abnormality. 2. Mild soft tissue swelling along the right parietal scalp.   CT CERVICAL SPINE:  -Trace anterolisthesis of C7 on T1.  1. Acute fracture through the anterior superior endplate of T1. Recommend further  evaluation with a cervical spine MRI to exclude the possibility of ligamentous injury. 2. Likely moderate spinal canal stenosis at C5-C6.  MR Cervical Spine Wo Contrast 1. Mild T1 wedge compression fracture with bone marrow edema underlying the superior endplate. No associated ligamentous injury. 2. Right facet edema at C7-T1, which may be a source of local neck pain. 3. Moderate C6-7 spinal canal stenosis with severe bilateral, right worse than left neural foraminal stenosis. 4. Mild spinal canal stenosis at C4-5 and C5-6 with moderate right and severe left C4-5 neural foraminal stenosis.   Active ROM A/PROM (deg) eval AROM 6/13  Flexion    Extension    Right lateral flexion 17 20  Left lateral flexion 8 15 with pain  Right rotation 34 66  Left rotation 25 45   (Blank rows = not tested) TODAY'S TREATMENT:    7/15  Trigger Point Dry-Needling  Treatment instructions: Expect mild to moderate muscle soreness. S/S of pneumothorax if dry needled over a lung field, and to seek immediate medical attention should they occur. Patient verbalized understanding of these instructions and education.   Patient Consent Given: Yes Education (verbally/handout)provided: Yes Muscles treated: L C/S paraspinals, L UT Electrical stimulation performed: N/A Treatment response/outcome: signficant LTR eliticited   STM C/S paraspinals C3-6 Bilat UPA and CPA C3-6 grade III C3-6 side glide grade III Manual traction   Exercises - - Seated Assisted Cervical Rotation with Towel  - 2-3 x daily - 7 x weekly - 1 sets - 10 reps - 2 hold - Seated Cervical Retraction  - 2-3 x daily - 7 x weekly - 1 sets - 10-12 reps       Previous:    Manual Therapy: STM to cervical paraspinals bilaterally, sub occipital in supine  STM to thoracic paraspinals and scapular mm, UT, levator in seated position   Therex:  Row with GTB x20 Extension with GTB x20 Bilateral ER with RTB 2x10 Supine horizontal abduction  2x10 RTB Levator stretch 30sec x3 bil Posterior shoulder rolls 2x15   PATIENT EDUCATION:  Education details: objective findings, posture, restrictions relating to compression fx, prognsosis, relevant anatomy, rationale of interventions, HEP, and POC.  PT answered pt's questions.   Person educated: Patient Education method: Medical illustrator, verbal cues, and handout Education comprehension: verbalized understanding, returned demonstration, verbal cues required  HOME EXERCISE PROGRAM: Access Code: [email or web address] URL: [email or web address] Date: 07/28/2022 Prepared by: Aaron Edelman  Exercises - Seated Transversus Abdominis Bracing  - 2-3 x daily - 7 x weekly - 1 sets - 10 reps - 3 seconds hold  ASSESSMENT:  CLINICAL IMPRESSION: Pt with good response to session  with ~20 deg improvement in L sided rotation following TPDN and manual. Pt with improvement in feeling of stiffness and no pain with rotation. Pt HEP updated today with cervical mobilty exercise. Plan to continue with joint mobilizations, TPDN PRN, and cervical mobility exercise. Pt with very good response to today's session. Pt would benefit from continued skilled therapy in order to reach goals and maximize functional cervical strength and ROM for return to regular daily activity and driving demands.  OBJECTIVE IMPAIRMENTS: decreased activity tolerance, decreased ROM, decreased strength, hypomobility, increased fascial restrictions, increased muscle spasms, and pain.   ACTIVITY LIMITATIONS: carrying and lifting  PARTICIPATION LIMITATIONS: driving and shopping  PERSONAL FACTORS: 1-2 comorbidities: T1 compression fracture and arthritis  are also affecting patient's functional outcome.   REHAB POTENTIAL: Good  CLINICAL DECISION MAKING: Stable/uncomplicated  EVALUATION COMPLEXITY: Low   GOALS:  SHORT TERM GOALS: Target date: 08/03/2022   Pt will report at least a 25% improvement in stiffness and  turning her head for improved daily mobility.  Baseline:  Goal status: INITIAL  2.  Pt will demo at least a 6-8 deg increase in cervical Sb'ing AROM and 10 deg in cervical rotation AROM bilat for improved stiffness and daily mobility.  Baseline:  Goal status: 75% MET----07/27/22   LONG TERM GOALS: Target date: 08/31/2022  Pt will be able to turn her head while driving without significant pain and difficulty.  Baseline:  Goal status: INITIAL  2.  Pt will be independent with HEP for improved pain, ROM, and function.   Baseline:  Goal status: INITIAL  3.  Pt will be able to perform her ADLs and IADLs without significant pain and difficulty.  Baseline:  Goal status: INITIAL  4.  Pt will demo at least 55 deg of cervical rotation AROM bilat for improved stiffness and daily mobility. Baseline:  Goal status: INITIAL     PLAN:  PT FREQUENCY:  1x/wk with progression to 2x/wk as needed  PT DURATION: 8 weeks  PLANNED INTERVENTIONS: Therapeutic exercises, Therapeutic activity, Neuromuscular re-education, Patient/Family education, Self Care, Joint mobilization, Aquatic Therapy, Dry Needling, Electrical stimulation, Cryotherapy, Moist heat, Taping, Ultrasound, Manual therapy, and Re-evaluation  PLAN FOR NEXT SESSION: Cont with ther ex and STW per healing considerations per T1 compression fracture   Zebedee Iba PT, DPT 08/28/22 2:46 PM

## 2022-08-29 ENCOUNTER — Encounter (HOSPITAL_BASED_OUTPATIENT_CLINIC_OR_DEPARTMENT_OTHER): Payer: 59 | Admitting: Physical Therapy

## 2022-09-07 ENCOUNTER — Ambulatory Visit (HOSPITAL_BASED_OUTPATIENT_CLINIC_OR_DEPARTMENT_OTHER): Payer: 59 | Admitting: Physical Therapy

## 2022-09-07 DIAGNOSIS — M542 Cervicalgia: Secondary | ICD-10-CM

## 2022-09-07 DIAGNOSIS — M546 Pain in thoracic spine: Secondary | ICD-10-CM

## 2022-09-07 NOTE — Therapy (Signed)
OUTPATIENT PHYSICAL THERAPY CERVICAL TREATMENT   Patient Name: Amber Klein MRN: [id number] DOB:07-22-1950, 72 y.o., female Today's Date: 09/08/2022  END OF SESSION:  PT End of Session - 09/07/22 0937     Visit Number 8    Number of Visits 14    Date for PT Re-Evaluation 10/19/22    Authorization Type UHC    PT Start Time 0845    PT Stop Time 0932    PT Time Calculation (min) 47 min    Activity Tolerance Patient tolerated treatment well    Behavior During Therapy WFL for tasks assessed/performed                  Past Medical History:  Diagnosis Date   Arthritis    Asthma    mild - allergy season induced   Cancer (HCC)    basal cell skin cancer   Complication of anesthesia    Sensitive to propofol - and BP drops   Dysrhythmia    Ear infection    Environmental and seasonal allergies    Femoral hernia of right side with obstruction 01/18/2018   GERD (gastroesophageal reflux disease)    no longer having issues takes TUMS occ.   History of kidney stones    Hypotension    Irritant contact dermatitis associated with stoma 06/25/2020   Pneumonia 02/2016   walking pneumonia    RBBB    Sinus infection    Past Surgical History:  Procedure Laterality Date   CATARACT EXTRACTION     CESAREAN SECTION  1979   1985   CO2 LASER APPLICATION N/A 05/10/2015   Procedure: CO2 LASER APPLICATION;  Surgeon: Waynard Reeds, MD;  Location: [facility];  Service: Gynecology;  Laterality: N/A;   COLON SURGERY  04/10/2020   with sepsis and colostomy   COLONOSCOPY     COLONOSCOPY WITH PROPOFOL N/A 11/11/2020   Procedure: COLONOSCOPY WITH PROPOFOL;  Surgeon: Rachael Fee, MD;  Location: [facility];  Service: Endoscopy;  Laterality: N/A;  NEEDS PREVISIT WITH ANESTHESIA   FOOT SURGERY Right 2009   INCISIONAL HERNIA REPAIR N/A 04/06/2021   Procedure: HERNIA REPAIR INCISIONAL REPAIR WITH MESH;  Surgeon: Axel Filler, MD;  Location: [facility] OR;  Service: General;  Laterality: N/A;    INGUINAL HERNIA REPAIR Right 01/18/2018   Procedure: OPEN REPAIR INCARCERATED RIGHT FEMORAL HERNIA WITH MESH;  Surgeon: Claud Kelp, MD;  Location: [facility] OR;  Service: General;  Laterality: Right;  GENERAL / TAP BLOCK   INSERTION OF MESH N/A 04/06/2021   Procedure: INSERTION OF MESH;  Surgeon: Axel Filler, MD;  Location: [facility] OR;  Service: General;  Laterality: N/A;   KNEE SURGERY Left 2016   LAPAROTOMY N/A 04/06/2021   Procedure: EXPLORATORY LAPAROTOMY;  Surgeon: Axel Filler, MD;  Location: [facility] OR;  Service: General;  Laterality: N/A;   LYSIS OF ADHESION N/A 04/06/2021   Procedure: LYSIS OF ADHESION;  Surgeon: Axel Filler, MD;  Location: [facility] OR;  Service: General;  Laterality: N/A;   ORIF ELBOW FRACTURE Left 07/04/2014   Procedure: OPEN REDUCTION INTERNAL FIXATION (ORIF) ELBOW/OLECRANON FRACTURE;  Surgeon: Cammy Copa, MD;  Location: [facility];  Service: Orthopedics;  Laterality: Left;   STOMA DILATATION N/A 12/08/2020   Procedure: STOMA DILATATION WITH STOMAPLASTY;  Surgeon: Luretha Murphy, MD;  Location: [facility];  Service: General;  Laterality: N/A;   VULVECTOMY N/A 08/10/2016   Procedure: WIDE EXCISION VULVECTOMY;  Surgeon: Waynard Reeds, MD;  Location: [facility];  Service: Gynecology;  Laterality: N/A;  XI ROBOTIC ASSISTED COLOSTOMY TAKEDOWN N/A 01/05/2021   Procedure: ROBOTIC COLOSTOMY REVERSAL WITH LAPAROSCOPIC LYSIS OF ADHESIONS;  Surgeon: Romie Levee, MD;  Location: [facility];  Service: General;  Laterality: N/A;   Patient Active Problem List   Diagnosis Date Noted   S/P hernia repair 04/06/2021   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   Colonic obstruction (HCC) 12/08/2020   Diverticulosis of colon 10/08/2020   Intractable vomiting 07/06/2020   Hypoxia 07/06/2020   Irritant contact dermatitis associated with stoma 06/25/2020   Diverticulitis 05/21/2020   GERD (gastroesophageal reflux disease)    Acute suppurative peritonitis (HCC) 04/11/2020   Perforated sigmoid colon  (HCC) 04/10/2020   Other constipation 04/06/2020   Femoral hernia of right side with obstruction 01/18/2018   Elbow fracture 07/04/2014     REFERRING PROVIDER: Julio Sicks, MD  REFERRING DIAG: [id number] (ICD-10-CM) - Spinal stenosis, cervical region  THERAPY DIAG:  Cervicalgia  Pain in thoracic spine  Stiffness of unspecified joint, not elsewhere classified  Rationale for Evaluation and Treatment: Rehabilitation  ONSET DATE: 06/02/2022  SUBJECTIVE:                                                                                                                                                                                                         SUBJECTIVE STATEMENT: "It's so much better."  Pt states she can turn her head to the right and still has some limitation to L.  Pt is not having to turn her whole body in the car.  Pt has less pain with neck mobility.  Pt saw MD last week and he was very pleased with progress.  MD released her from his care.  Pt reports she was not given restrictions.  Pt reports 75% improvement in stiffness and turning her head.  Pt states she joined [employer]. Pt states she responded very well to the dry needling last visit.  She felt better and had improved ROM after dry needling.  Pt reports compliance with HEP.   Hand dominance: Right  PERTINENT HISTORY:  T1 Compression Fracture on 06/02/22 Per dx and CT/MRI findings  PMHx:  Arthritis, osteopenia, R BBB, R foot surgery in 2009, L knee surgery 2016, ORIF L elbow fx in 2016  PAIN:  NPRS:  0-.5/10 current at rest.  1.5/10 with R rotation, 2.5-3/10 with L rotation.  Worst:  4/10 Location:  L sided cervical, L proximal/superior scapula  PRECAUTIONS: Other: T1 compression fracture  WEIGHT BEARING RESTRICTIONS: none indicated on MD order  FALLS:  Has patient fallen in last 6 months? Yes. 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  PLOF: Independent.  Pt was able to perform all of her daily activities without cervical pain.  Pt was able to turn her head without difficulty.   PATIENT GOALS: to be able to turn her neck   OBJECTIVE:   FOTO 7/11: 59% (same as IE. Goal of 69%)  DIAGNOSTIC FINDINGS:  IMPRESSION: CT HEAD:   1. No acute intracranial abnormality. 2. Mild soft tissue swelling along the right parietal scalp.   CT CERVICAL SPINE:  -Trace anterolisthesis of C7 on T1.  1. Acute fracture through the anterior superior endplate of T1. Recommend further evaluation with a cervical spine MRI to exclude the possibility of ligamentous injury. 2. Likely moderate spinal canal stenosis at C5-C6.  MR Cervical Spine Wo Contrast 1. Mild T1 wedge compression fracture with bone marrow edema underlying the superior endplate. No associated ligamentous injury. 2. Right facet edema at C7-T1, which may be a source of local neck pain. 3. Moderate C6-7 spinal canal stenosis with severe bilateral, right worse than left neural foraminal stenosis. 4. Mild spinal canal stenosis at C4-5 and C5-6 with moderate right and severe left C4-5 neural foraminal stenosis.   TODAY'S TREATMENT:   Active ROM A/PROM (deg) eval AROM 6/13 AROM 7/25  Flexion     Extension     Right lateral flexion 17 20 29  deg  Left lateral flexion 8 15 with pain 23 deg. no pain, but felt uncomfortable  Right rotation 34 66 72  Left rotation 25 45 45   (Blank rows = not tested)     Therex:  Reviewed current function, pain levels, and response to prior Rx.  Pt performed: Row with GTB x15 Extension with GTB 2x10 Bilateral ER with RTB 2x10 Doorway pec stretch 15s x3   See below for pt education   Manual Therapy: STM to cervical paraspinals bilaterally, sub occipital in supine  STM to thoracic paraspinals and scapular mm, UT, levator in seated position   PATIENT EDUCATION:  Education details: objective findings, goal  progress, exercise form, relevant anatomy, rationale of interventions, and POC.  PT answered pt's questions.   Person educated: Patient Education method: Medical illustrator, verbal cues, and handout Education comprehension: verbalized understanding, returned demonstration, verbal cues required  HOME EXERCISE PROGRAM: Access Code: [email or web address] URL: [email or web address] Date: 07/28/2022 Prepared by: Aaron Edelman    ASSESSMENT:  CLINICAL IMPRESSION: Pt continues to have some limitations with turning her head though has made good progress.  She reports reduced pain with neck mobility.  Pt reports 75% improvement in stiffness and turning her head.  Pt had a very good response to dry needling last session.  Pt has been progressing with exercises well and has good tolerance with exercises.  Pt demonstrates improved cervical AROM in bilat SB and R rotation.  Her L rotation was the same as prior testing though has improved from initial eval.  Pt has met all STG's and LTG #3 and partially met LTG's #1,4.  Pt should benefit from continued skilled therapy in order to reach goals and maximize functional cervical strength and ROM for return to regular daily activity and driving demands.  OBJECTIVE IMPAIRMENTS: decreased activity tolerance, decreased ROM, decreased strength, hypomobility, increased fascial restrictions, increased muscle spasms, and pain.   ACTIVITY LIMITATIONS: carrying and lifting  PARTICIPATION LIMITATIONS: driving and shopping  PERSONAL FACTORS: 1-2 comorbidities: T1 compression fracture and arthritis  are  also affecting patient's functional outcome.   REHAB POTENTIAL: Good  CLINICAL DECISION MAKING: Stable/uncomplicated  EVALUATION COMPLEXITY: Low   GOALS:  SHORT TERM GOALS: Target date: 08/03/2022   Pt will report at least a 25% improvement in stiffness and turning her head for improved daily mobility.  Baseline:  Goal status: GOAL MET--7/25  2.   Pt will demo at least a 6-8 deg increase in cervical Sb'ing AROM and 10 deg in cervical rotation AROM bilat for improved stiffness and daily mobility.  Baseline:  Goal status: 75% MET----07/27/22   LONG TERM GOALS: Target date: 10/19/2022  Pt will be able to turn her head while driving without significant pain and difficulty.  Baseline:  Goal status: PARTIALLY MET--7/25  2.  Pt will be independent with HEP for improved pain, ROM, and function.   Baseline:  Goal status: PROGRESSING--7/25  3.  Pt will be able to perform her ADLs and IADLs without significant pain and difficulty.  Baseline:  Goal status: GOAL MET--7/25  4.  Pt will demo at least 55 deg of cervical rotation AROM bilat for improved stiffness and daily mobility. Baseline:  Goal status: 60% MET     PLAN:  PT FREQUENCY:  1x/wk  PT DURATION: 4-6 weeks  PLANNED INTERVENTIONS: Therapeutic exercises, Therapeutic activity, Neuromuscular re-education, Patient/Family education, Self Care, Joint mobilization, Aquatic Therapy, Dry Needling, Electrical stimulation, Cryotherapy, Moist heat, Taping, Ultrasound, Manual therapy, and Re-evaluation  PLAN FOR NEXT SESSION: Cont with ther ex and STW per healing considerations per T1 compression fracture.  Cont with dry needling.   Audie Clear III PT, DPT 09/08/22 12:17 PM

## 2022-09-08 ENCOUNTER — Encounter (HOSPITAL_BASED_OUTPATIENT_CLINIC_OR_DEPARTMENT_OTHER): Payer: Self-pay | Admitting: Physical Therapy

## 2022-09-22 ENCOUNTER — Encounter (HOSPITAL_BASED_OUTPATIENT_CLINIC_OR_DEPARTMENT_OTHER): Payer: Self-pay

## 2022-09-22 ENCOUNTER — Ambulatory Visit (HOSPITAL_BASED_OUTPATIENT_CLINIC_OR_DEPARTMENT_OTHER): Payer: 59 | Attending: Neurosurgery

## 2022-09-22 DIAGNOSIS — M546 Pain in thoracic spine: Secondary | ICD-10-CM | POA: Diagnosis not present

## 2022-09-22 DIAGNOSIS — M542 Cervicalgia: Secondary | ICD-10-CM

## 2022-09-22 NOTE — Therapy (Signed)
OUTPATIENT PHYSICAL THERAPY CERVICAL TREATMENT   Patient Name: Amber Klein MRN: [id number] DOB:1950/09/03, 72 y.o., female Today's Date: 09/22/2022  END OF SESSION:  PT End of Session - 09/22/22 1558     Visit Number 9    Number of Visits 14    Date for PT Re-Evaluation 10/19/22    Authorization Type [employer]    PT Start Time 1430    PT Stop Time 1515    PT Time Calculation (min) 45 min    Activity Tolerance Patient tolerated treatment well    Behavior During Therapy WFL for tasks assessed/performed                   Past Medical History:  Diagnosis Date   Arthritis    Asthma    mild - allergy season induced   Cancer (HCC)    basal cell skin cancer   Complication of anesthesia    Sensitive to propofol - and BP drops   Dysrhythmia    Ear infection    Environmental and seasonal allergies    Femoral hernia of right side with obstruction 01/18/2018   GERD (gastroesophageal reflux disease)    no longer having issues takes TUMS occ.   History of kidney stones    Hypotension    Irritant contact dermatitis associated with stoma 06/25/2020   Pneumonia 02/2016   walking pneumonia    RBBB    Sinus infection    Past Surgical History:  Procedure Laterality Date   CATARACT EXTRACTION     CESAREAN SECTION  1979   1985   CO2 LASER APPLICATION N/A 05/10/2015   Procedure: CO2 LASER APPLICATION;  Surgeon: Waynard Reeds, MD;  Location: [facility];  Service: Gynecology;  Laterality: N/A;   COLON SURGERY  04/10/2020   with sepsis and colostomy   COLONOSCOPY     COLONOSCOPY WITH PROPOFOL N/A 11/11/2020   Procedure: COLONOSCOPY WITH PROPOFOL;  Surgeon: Rachael Fee, MD;  Location: [facility];  Service: Endoscopy;  Laterality: N/A;  NEEDS PREVISIT WITH ANESTHESIA   FOOT SURGERY Right 2009   INCISIONAL HERNIA REPAIR N/A 04/06/2021   Procedure: HERNIA REPAIR INCISIONAL REPAIR WITH MESH;  Surgeon: Axel Filler, MD;  Location: [facility] OR;  Service: General;  Laterality:  N/A;   INGUINAL HERNIA REPAIR Right 01/18/2018   Procedure: OPEN REPAIR INCARCERATED RIGHT FEMORAL HERNIA WITH MESH;  Surgeon: Claud Kelp, MD;  Location: [facility] OR;  Service: General;  Laterality: Right;  GENERAL / TAP BLOCK   INSERTION OF MESH N/A 04/06/2021   Procedure: INSERTION OF MESH;  Surgeon: Axel Filler, MD;  Location: [facility] OR;  Service: General;  Laterality: N/A;   KNEE SURGERY Left 2016   LAPAROTOMY N/A 04/06/2021   Procedure: EXPLORATORY LAPAROTOMY;  Surgeon: Axel Filler, MD;  Location: [facility] OR;  Service: General;  Laterality: N/A;   LYSIS OF ADHESION N/A 04/06/2021   Procedure: LYSIS OF ADHESION;  Surgeon: Axel Filler, MD;  Location: [facility] OR;  Service: General;  Laterality: N/A;   ORIF ELBOW FRACTURE Left 07/04/2014   Procedure: OPEN REDUCTION INTERNAL FIXATION (ORIF) ELBOW/OLECRANON FRACTURE;  Surgeon: Cammy Copa, MD;  Location: [facility];  Service: Orthopedics;  Laterality: Left;   STOMA DILATATION N/A 12/08/2020   Procedure: STOMA DILATATION WITH STOMAPLASTY;  Surgeon: Luretha Murphy, MD;  Location: [facility];  Service: General;  Laterality: N/A;   VULVECTOMY N/A 08/10/2016   Procedure: WIDE EXCISION VULVECTOMY;  Surgeon: Waynard Reeds, MD;  Location: [facility];  Service: Gynecology;  Laterality:  N/A;   XI ROBOTIC ASSISTED COLOSTOMY TAKEDOWN N/A 01/05/2021   Procedure: ROBOTIC COLOSTOMY REVERSAL WITH LAPAROSCOPIC LYSIS OF ADHESIONS;  Surgeon: Romie Levee, MD;  Location: [facility];  Service: General;  Laterality: N/A;   Patient Active Problem List   Diagnosis Date Noted   S/P hernia repair 04/06/2021   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   Colonic obstruction (HCC) 12/08/2020   Diverticulosis of colon 10/08/2020   Intractable vomiting 07/06/2020   Hypoxia 07/06/2020   Irritant contact dermatitis associated with stoma 06/25/2020   Diverticulitis 05/21/2020   GERD (gastroesophageal reflux disease)    Acute suppurative peritonitis (HCC) 04/11/2020   Perforated sigmoid  colon (HCC) 04/10/2020   Other constipation 04/06/2020   Femoral hernia of right side with obstruction 01/18/2018   Elbow fracture 07/04/2014     REFERRING PROVIDER: Julio Sicks, MD  REFERRING DIAG: [id number] (ICD-10-CM) - Spinal stenosis, cervical region  THERAPY DIAG:  Pain in thoracic spine  Cervicalgia  Stiffness of unspecified joint, not elsewhere classified  Rationale for Evaluation and Treatment: Rehabilitation  ONSET DATE: 06/02/2022  SUBJECTIVE:                                                                                                                                                                                                         SUBJECTIVE STATEMENT: Pt reports her neck has been doing well overall. Went to [place] and did not complete many exercises, but did not have much tightness following her trip.    Hand dominance: Right  PERTINENT HISTORY:  T1 Compression Fracture on 06/02/22 Per dx and CT/MRI findings  PMHx:  Arthritis, osteopenia, R BBB, R foot surgery in 2009, L knee surgery 2016, ORIF L elbow fx in 2016  PAIN:  NPRS:  0-.5/10 current at rest.  1.5/10 with R rotation, 2.5-3/10 with L rotation.  Worst:  4/10 Location:  L sided cervical, L proximal/superior scapula  PRECAUTIONS: Other: T1 compression fracture  WEIGHT BEARING RESTRICTIONS: none indicated on MD order  FALLS:  Has patient fallen in last 6 months? Yes. 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  PLOF: Independent.  Pt was able to perform all of her daily activities without cervical pain.  Pt was able to turn her head without difficulty.   PATIENT GOALS: to be able to turn her neck   OBJECTIVE:   FOTO 7/11: 59% (same as IE. Goal of 69%)  DIAGNOSTIC FINDINGS:  IMPRESSION: CT HEAD:   1. No acute intracranial abnormality.  2. Mild soft tissue swelling along the right parietal scalp.   CT  CERVICAL SPINE:  -Trace anterolisthesis of C7 on T1.  1. Acute fracture through the anterior superior endplate of T1. Recommend further evaluation with a cervical spine MRI to exclude the possibility of ligamentous injury. 2. Likely moderate spinal canal stenosis at C5-C6.  MR Cervical Spine Wo Contrast 1. Mild T1 wedge compression fracture with bone marrow edema underlying the superior endplate. No associated ligamentous injury. 2. Right facet edema at C7-T1, which may be a source of local neck pain. 3. Moderate C6-7 spinal canal stenosis with severe bilateral, right worse than left neural foraminal stenosis. 4. Mild spinal canal stenosis at C4-5 and C5-6 with moderate right and severe left C4-5 neural foraminal stenosis.    Active ROM A/PROM (deg) eval AROM 6/13 AROM 7/25  Flexion     Extension     Right lateral flexion 17 20 29  deg  Left lateral flexion 8 15 with pain 23 deg. no pain, but felt uncomfortable  Right rotation 34 66 72  Left rotation 25 45 45   (Blank rows = not tested)    TODAY'S TREATMENT:  Therex:  Reviewed current function, pain levels, and response to prior Rx.  Pt performed: Row with GTB 2x15 Extension with GTB 2x15 Bilateral ER with RTB 2x10 Horizontal abduction with RTB x15 Wall angels x10 Doorway pec stretch 15s x3  Chin tucks with ball at wall- 3s x15   Manual Therapy: STM to cervical paraspinals bilaterally, sub occipital in supine  STM to thoracic paraspinals and scapular mm, UT, levator in seated position Cupping to L upper trap and levator   PATIENT EDUCATION:  Education details: objective findings, goal progress, exercise form, relevant anatomy, rationale of interventions, and POC.  PT answered pt's questions.   Person educated: Patient Education method: Medical illustrator, verbal cues, and handout Education comprehension: verbalized understanding, returned demonstration, verbal cues required  HOME EXERCISE  PROGRAM: Access Code: [email or web address] URL: [email or web address] Date: 07/28/2022 Prepared by: Aaron Edelman    ASSESSMENT:  CLINICAL IMPRESSION: Continued with cupping due to previous benefit. This helped decrease triggerpoint presence in L uppter trap and LS with pt citing relief afterwards. Pt has improved tolerance for cervical AROM and improving postural awareness. Will continue to progress as tolerated with postural strengthening.   OBJECTIVE IMPAIRMENTS: decreased activity tolerance, decreased ROM, decreased strength, hypomobility, increased fascial restrictions, increased muscle spasms, and pain.   ACTIVITY LIMITATIONS: carrying and lifting  PARTICIPATION LIMITATIONS: driving and shopping  PERSONAL FACTORS: 1-2 comorbidities: T1 compression fracture and arthritis  are also affecting patient's functional outcome.   REHAB POTENTIAL: Good  CLINICAL DECISION MAKING: Stable/uncomplicated  EVALUATION COMPLEXITY: Low   GOALS:  SHORT TERM GOALS: Target date: 08/03/2022   Pt will report at least a 25% improvement in stiffness and turning her head for improved daily mobility.  Baseline:  Goal status: GOAL MET--7/25  2.  Pt will demo at least a 6-8 deg increase in cervical Sb'ing AROM and 10 deg in cervical rotation AROM bilat for improved stiffness and daily mobility.  Baseline:  Goal status: 75% MET----07/27/22   LONG TERM GOALS: Target date: 10/19/2022  Pt will be able to turn her head while driving without significant pain and difficulty.  Baseline:  Goal status: PARTIALLY MET--7/25  2.  Pt will be independent with HEP for improved pain, ROM, and function.   Baseline:  Goal status: PROGRESSING--7/25  3.  Pt will be able to perform her ADLs  and IADLs without significant pain and difficulty.  Baseline:  Goal status: GOAL MET--7/25  4.  Pt will demo at least 55 deg of cervical rotation AROM bilat for improved stiffness and daily mobility. Baseline:  Goal  status: 60% MET     PLAN:  PT FREQUENCY:  1x/wk  PT DURATION: 4-6 weeks  PLANNED INTERVENTIONS: Therapeutic exercises, Therapeutic activity, Neuromuscular re-education, Patient/Family education, Self Care, Joint mobilization, Aquatic Therapy, Dry Needling, Electrical stimulation, Cryotherapy, Moist heat, Taping, Ultrasound, Manual therapy, and Re-evaluation  PLAN FOR NEXT SESSION: Cont with ther ex and STW per healing considerations per T1 compression fracture.  Cont with dry needling.  Riki Altes, PTA  09/22/22 4:50 PM

## 2022-09-28 ENCOUNTER — Encounter (HOSPITAL_BASED_OUTPATIENT_CLINIC_OR_DEPARTMENT_OTHER): Payer: Self-pay | Admitting: Physical Therapy

## 2022-09-28 ENCOUNTER — Ambulatory Visit (HOSPITAL_BASED_OUTPATIENT_CLINIC_OR_DEPARTMENT_OTHER): Payer: 59 | Admitting: Physical Therapy

## 2022-09-28 DIAGNOSIS — M546 Pain in thoracic spine: Secondary | ICD-10-CM

## 2022-09-28 DIAGNOSIS — M542 Cervicalgia: Secondary | ICD-10-CM

## 2022-09-28 NOTE — Therapy (Signed)
OUTPATIENT PHYSICAL THERAPY CERVICAL TREATMENT   Patient Name: Amber Klein MRN: [id number] DOB:February 25, 1950, 72 y.o., female Today's Date: 09/28/2022  END OF SESSION:  PT End of Session - 09/28/22 0935     Visit Number 10    Number of Visits 14    Date for PT Re-Evaluation 10/19/22    Authorization Type UHC    PT Start Time 0933    PT Stop Time 1012    PT Time Calculation (min) 39 min    Activity Tolerance Patient tolerated treatment well    Behavior During Therapy WFL for tasks assessed/performed                   Past Medical History:  Diagnosis Date   Arthritis    Asthma    mild - allergy season induced   Cancer (HCC)    basal cell skin cancer   Complication of anesthesia    Sensitive to propofol - and BP drops   Dysrhythmia    Ear infection    Environmental and seasonal allergies    Femoral hernia of right side with obstruction 01/18/2018   GERD (gastroesophageal reflux disease)    no longer having issues takes TUMS occ.   History of kidney stones    Hypotension    Irritant contact dermatitis associated with stoma 06/25/2020   Pneumonia 02/2016   walking pneumonia    RBBB    Sinus infection    Past Surgical History:  Procedure Laterality Date   CATARACT EXTRACTION     CESAREAN SECTION  1979   1985   CO2 LASER APPLICATION N/A 05/10/2015   Procedure: CO2 LASER APPLICATION;  Surgeon: Waynard Reeds, MD;  Location: [facility];  Service: Gynecology;  Laterality: N/A;   COLON SURGERY  04/10/2020   with sepsis and colostomy   COLONOSCOPY     COLONOSCOPY WITH PROPOFOL N/A 11/11/2020   Procedure: COLONOSCOPY WITH PROPOFOL;  Surgeon: Rachael Fee, MD;  [facility]: [facility];  Service: Endoscopy;  Laterality: N/A;  NEEDS PREVISIT WITH ANESTHESIA   FOOT SURGERY Right 2009   INCISIONAL HERNIA REPAIR N/A 04/06/2021   Procedure: HERNIA REPAIR INCISIONAL REPAIR WITH MESH;  Surgeon: Axel Filler, MD;  Location: [facility] OR;  Service: General;  Laterality:  N/A;   INGUINAL HERNIA REPAIR Right 01/18/2018   Procedure: OPEN REPAIR INCARCERATED RIGHT FEMORAL HERNIA WITH MESH;  Surgeon: Claud Kelp, MD;  Location: [facility] OR;  Service: General;  Laterality: Right;  GENERAL / TAP BLOCK   INSERTION OF MESH N/A 04/06/2021   Procedure: INSERTION OF MESH;  Surgeon: Axel Filler, MD;  Location: [facility] OR;  Service: General;  Laterality: N/A;   KNEE SURGERY Left 2016   LAPAROTOMY N/A 04/06/2021   Procedure: EXPLORATORY LAPAROTOMY;  Surgeon: Axel Filler, MD;  Location: [facility] OR;  Service: General;  Laterality: N/A;   LYSIS OF ADHESION N/A 04/06/2021   Procedure: LYSIS OF ADHESION;  Surgeon: Axel Filler, MD;  Location: [facility] OR;  Service: General;  Laterality: N/A;   ORIF ELBOW FRACTURE Left 07/04/2014   Procedure: OPEN REDUCTION INTERNAL FIXATION (ORIF) ELBOW/OLECRANON FRACTURE;  Surgeon: Cammy Copa, MD;  Location: [facility];  Service: Orthopedics;  Laterality: Left;   STOMA DILATATION N/A 12/08/2020   Procedure: STOMA DILATATION WITH STOMAPLASTY;  Surgeon: Luretha Murphy, MD;  Location: [facility];  Service: General;  Laterality: N/A;   VULVECTOMY N/A 08/10/2016   Procedure: WIDE EXCISION VULVECTOMY;  Surgeon: Waynard Reeds, MD;  Location: [facility];  Service: Gynecology;  Laterality:  N/A;   XI ROBOTIC ASSISTED COLOSTOMY TAKEDOWN N/A 01/05/2021   Procedure: ROBOTIC COLOSTOMY REVERSAL WITH LAPAROSCOPIC LYSIS OF ADHESIONS;  Surgeon: Romie Levee, MD;  Location: [facility];  Service: General;  Laterality: N/A;   Patient Active Problem List   Diagnosis Date Noted   S/P hernia repair 04/06/2021   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   Colonic obstruction (HCC) 12/08/2020   Diverticulosis of colon 10/08/2020   Intractable vomiting 07/06/2020   Hypoxia 07/06/2020   Irritant contact dermatitis associated with stoma 06/25/2020   Diverticulitis 05/21/2020   GERD (gastroesophageal reflux disease)    Acute suppurative peritonitis (HCC) 04/11/2020   Perforated sigmoid  colon (HCC) 04/10/2020   Other constipation 04/06/2020   Femoral hernia of right side with obstruction 01/18/2018   Elbow fracture 07/04/2014     REFERRING PROVIDER: Julio Sicks, MD  REFERRING DIAG: [phone] (ICD-10-CM) - Spinal stenosis, cervical region  THERAPY DIAG:  Pain in thoracic spine  Cervicalgia  Stiffness of unspecified joint, not elsewhere classified  Rationale for Evaluation and Treatment: Rehabilitation  ONSET DATE: 06/02/2022  SUBJECTIVE:                                                                                                                                                                                                         SUBJECTIVE STATEMENT: Pt states that her L neck/shoulder was much better. A week and half ago, pt had increase in L neck from a hug. Pt states the L side is spasmed and very painful with driving.    Hand dominance: Right  PERTINENT HISTORY:  T1 Compression Fracture on 06/02/22 Per dx and CT/MRI findings  PMHx:  Arthritis, osteopenia, R BBB, R foot surgery in 2009, L knee surgery 2016, ORIF L elbow fx in 2016  PAIN:  NPRS:  0-.5/10 current at rest.  1.5/10 with R rotation, 2.5-3/10 with L rotation.  Worst:  4/10 Location:  L sided cervical, L proximal/superior scapula  PRECAUTIONS: Other: T1 compression fracture  WEIGHT BEARING RESTRICTIONS: none indicated on MD order  FALLS:  Has patient fallen in last 6 months? Yes. 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  PLOF: Independent.  Pt was able to perform all of her daily activities without cervical pain.  Pt was able to turn her head without difficulty.   PATIENT GOALS: to be able to turn her neck   OBJECTIVE:   FOTO 7/11: 59% (same as IE. Goal of 69%)  DIAGNOSTIC FINDINGS:  IMPRESSION: CT  HEAD:   1. No acute intracranial abnormality. 2. Mild soft tissue swelling along the right  parietal scalp.   CT CERVICAL SPINE:  -Trace anterolisthesis of C7 on T1.  1. Acute fracture through the anterior superior endplate of T1. Recommend further evaluation with a cervical spine MRI to exclude the possibility of ligamentous injury. 2. Likely moderate spinal canal stenosis at C5-C6.  MR Cervical Spine Wo Contrast 1. Mild T1 wedge compression fracture with bone marrow edema underlying the superior endplate. No associated ligamentous injury. 2. Right facet edema at C7-T1, which may be a source of local neck pain. 3. Moderate C6-7 spinal canal stenosis with severe bilateral, right worse than left neural foraminal stenosis. 4. Mild spinal canal stenosis at C4-5 and C5-6 with moderate right and severe left C4-5 neural foraminal stenosis.    Active ROM A/PROM (deg) eval AROM 6/13 AROM 7/25  Flexion     Extension     Right lateral flexion 17 20 29  deg  Left lateral flexion 8 15 with pain 23 deg. no pain, but felt uncomfortable  Right rotation 34 66 72  Left rotation 25 45 45   (Blank rows = not tested)    TODAY'S TREATMENT:  8/15   Trigger Point Dry-Needling  Treatment instructions: Expect mild to moderate muscle soreness. S/S of pneumothorax if dry needled over a lung field, and to seek immediate medical attention should they occur. Patient verbalized understanding of these instructions and education.   Patient Consent Given: Yes Education (verbally/handout)provided: Yes Muscles treated: L C/S paraspinals, L UT Electrical stimulation performed: N/A Treatment response/outcome: signficant LTR eliticited    STM C/S paraspinals C3-6 Bilat UPA and CPA C3-6 grade III C3-6 side glide grade III Manual traction       Previous: Therex:  Reviewed current function, pain levels, and response to prior Rx.  Pt performed: Row with GTB 2x15 Extension with GTB 2x15 Bilateral ER with RTB 2x10 Horizontal abduction with RTB x15 Wall angels x10 Doorway pec stretch 15s  x3  Chin tucks with ball at wall- 3s x15   Manual Therapy: STM to cervical paraspinals bilaterally, sub occipital in supine  STM to thoracic paraspinals and scapular mm, UT, levator in seated position Cupping to L upper trap and levator   PATIENT EDUCATION:  Education details: objective findings, goal progress, exercise form, relevant anatomy, rationale of interventions, and POC.  PT answered pt's questions.   Person educated: Patient Education method: Medical illustrator, verbal cues, and handout Education comprehension: verbalized understanding, returned demonstration, verbal cues required  HOME EXERCISE PROGRAM: Access Code: [email or web address] URL: [email or web address] Date: 07/28/2022 Prepared by: Aaron Edelman    ASSESSMENT:  CLINICAL IMPRESSION: Patient exercised held today due to increase in pain.  Patient session focused on use of modalities and manual therapy in order to reduce spasm of the left upper trap and cervical paraspinals.  Patient with highly highly reactive left upper trap today session with significant twitching at all dry needling sites.  During manual therapy and soft tissue mobilization there was palpable muscle twitching and palpable increase in soft tissue extensibility.  Plan for patient to continue with home exercise program as prescribed previously.  Continue with postural strengthening as tolerated and use of modalities as needed for pain management.  Patient will benefit from continued skilled therapy in order to address current functional deficits and reduction of neck pain to return to normal daily activity.  OBJECTIVE IMPAIRMENTS: decreased activity tolerance, decreased ROM, decreased strength, hypomobility, increased fascial restrictions, increased  muscle spasms, and pain.   ACTIVITY LIMITATIONS: carrying and lifting  PARTICIPATION LIMITATIONS: driving and shopping  PERSONAL FACTORS: 1-2 comorbidities: T1 compression fracture and  arthritis  are also affecting patient's functional outcome.   REHAB POTENTIAL: Good  CLINICAL DECISION MAKING: Stable/uncomplicated  EVALUATION COMPLEXITY: Low   GOALS:  SHORT TERM GOALS: Target date: 08/03/2022   Pt will report at least a 25% improvement in stiffness and turning her head for improved daily mobility.  Baseline:  Goal status: GOAL MET--7/25  2.  Pt will demo at least a 6-8 deg increase in cervical Sb'ing AROM and 10 deg in cervical rotation AROM bilat for improved stiffness and daily mobility.  Baseline:  Goal status: 75% MET----07/27/22   LONG TERM GOALS: Target date: 10/19/2022  Pt will be able to turn her head while driving without significant pain and difficulty.  Baseline:  Goal status: PARTIALLY MET--7/25  2.  Pt will be independent with HEP for improved pain, ROM, and function.   Baseline:  Goal status: PROGRESSING--7/25  3.  Pt will be able to perform her ADLs and IADLs without significant pain and difficulty.  Baseline:  Goal status: GOAL MET--7/25  4.  Pt will demo at least 55 deg of cervical rotation AROM bilat for improved stiffness and daily mobility. Baseline:  Goal status: 60% MET     PLAN:  PT FREQUENCY:  1x/wk  PT DURATION: 4-6 weeks  PLANNED INTERVENTIONS: Therapeutic exercises, Therapeutic activity, Neuromuscular re-education, Patient/Family education, Self Care, Joint mobilization, Aquatic Therapy, Dry Needling, Electrical stimulation, Cryotherapy, Moist heat, Taping, Ultrasound, Manual therapy, and Re-evaluation  PLAN FOR NEXT SESSION: Cont with ther ex and STW per healing considerations per T1 compression fracture.  Cont with dry needling.  Zebedee Iba PT, DPT 09/28/22 10:14 AM

## 2022-10-01 ENCOUNTER — Telehealth: Payer: 59 | Admitting: Nurse Practitioner

## 2022-10-01 DIAGNOSIS — B9689 Other specified bacterial agents as the cause of diseases classified elsewhere: Secondary | ICD-10-CM | POA: Diagnosis not present

## 2022-10-01 DIAGNOSIS — J019 Acute sinusitis, unspecified: Secondary | ICD-10-CM

## 2022-10-01 MED ORDER — DOXYCYCLINE HYCLATE 100 MG PO TABS
100.0000 mg | ORAL_TABLET | Freq: Two times a day (BID) | ORAL | 0 refills | Status: DC
Start: 2022-10-01 — End: 2023-03-26

## 2022-10-01 NOTE — Progress Notes (Signed)
Virtual Visit Consent   Amber Klein, you are scheduled for a virtual visit with a [facility] provider today. Just as with appointments in the office, your consent must be obtained to participate. Your consent will be active for this visit and any virtual visit you may have with one of our providers in the next 365 days. If you have a MyChart account, a copy of this consent can be sent to you electronically.  As this is a virtual visit, video technology does not allow for your provider to perform a traditional examination. This may limit your provider's ability to fully assess your condition. If your provider identifies any concerns that need to be evaluated in person or the need to arrange testing (such as labs, EKG, etc.), we will make arrangements to do so. Although advances in technology are sophisticated, we cannot ensure that it will always work on either your end or our end. If the connection with a video visit is poor, the visit may have to be switched to a telephone visit. With either a video or telephone visit, we are not always able to ensure that we have a secure connection.  By engaging in this virtual visit, you consent to the provision of healthcare and authorize for your insurance to be billed (if applicable) for the services provided during this visit. Depending on your insurance coverage, you may receive a charge related to this service.  I need to obtain your verbal consent now. Are you willing to proceed with your visit today? Amber Klein has provided verbal consent on 10/01/2022 for a virtual visit (video or telephone). Amber Rigg, NP  Date: 10/01/2022 10:05 AM  Virtual Visit via Video Note   I, Amber Klein, connected with  Amber Klein  ([id number], 08/27/1950) on 10/01/22 at 10:30 AM EDT by a video-enabled telemedicine application and verified that I am speaking with the correct person using two identifiers.  Location: Patient: Virtual  Visit Location Patient: Home Provider: Virtual Visit Location Provider: Home Office   I discussed the limitations of evaluation and management by telemedicine and the availability of in person appointments. The patient expressed understanding and agreed to proceed.    History of Present Illness: Amber Klein is a 72 y.o. who identifies as a female who was assigned female at birth, and is being seen today for BACTERIAL SINUSITIS.  Amber Klein is currently experiencing symptoms of severe bacterial sinusitis over the past several days. She notes the following: bilateral ear pressure, fever, headache, sinus pressure, headache, malaise. She has been using allegra D, OTC sinus medication, saline nasal rinse and nasocort. She has a history of sinus infections and states current symptoms are similar.    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   Colonic obstruction (HCC) 12/08/2020   Diverticulosis of colon 10/08/2020   Intractable vomiting 07/06/2020   Hypoxia 07/06/2020   Irritant contact dermatitis associated with stoma 06/25/2020   Diverticulitis 05/21/2020   GERD (gastroesophageal reflux disease)    Acute suppurative peritonitis (HCC) 04/11/2020   Perforated sigmoid colon (HCC) 04/10/2020   Other constipation 04/06/2020   Femoral hernia of right side with obstruction 01/18/2018   Elbow fracture 07/04/2014    Allergies:  Allergies  Allergen Reactions   Iodinated Contrast Media Anaphylaxis   Shellfish Allergy Anaphylaxis   Vancomycin Anaphylaxis   Wasp Venom Anaphylaxis   Ciprofloxacin Hives and  Swelling    Hives   Sulfa Antibiotics Swelling and Other (See Comments)    Migraine   Zithromax [Azithromycin] Hives   Latex Rash    Sensitivity on too long   Propofol     Couldn't wake patient up - needs lower doses    Medications:  Current Outpatient Medications:    doxycycline  (VIBRA-TABS) 100 MG tablet, Take 1 tablet (100 mg total) by mouth 2 (two) times daily., Disp: 20 tablet, Rfl: 0   albuterol (VENTOLIN HFA) 108 (90 Base) MCG/ACT inhaler, Inhale 2 puffs into the lungs every 6 (six) hours as needed for wheezing or shortness of breath., Disp: 8.5 g, Rfl: 2   EPINEPHrine 0.3 MG/0.3ML SOSY, Inject 0.3 mLs (0.3 mg total) into the muscle as needed for anaphylaxis, Disp: 1 each, Rfl: 2   estradiol (CLIMARA - DOSED IN MG/24 HR) 0.05 mg/24hr patch, 0.05 mg once a week., Disp: , Rfl:    fexofenadine-pseudoephedrine (ALLEGRA-D) 60-120 MG 12 hr tablet, Take 1 tablet by mouth daily., Disp: , Rfl:    latanoprost (XALATAN) 0.005 % ophthalmic solution, Place 1 drop into both eyes at bedtime., Disp: , Rfl:    Multiple Vitamin (MULTIVITAMIN WITH MINERALS) TABS tablet, Take 1 tablet by mouth daily., Disp: , Rfl:    progesterone (PROMETRIUM) 100 MG capsule, Take 100 mg by mouth daily., Disp: , Rfl:   Observations/Objective: Patient is well-developed, well-nourished in no acute distress.  Resting comfortably at home.  Head is normocephalic, atraumatic.  No labored breathing.  Speech is clear and coherent with logical content.  Patient is alert and oriented at baseline.    Assessment and Plan: 1. Acute bacterial sinusitis - doxycycline (VIBRA-TABS) 100 MG tablet; Take 1 tablet (100 mg total) by mouth 2 (two) times daily.  Dispense: 20 tablet; Refill: 0   Follow Up Instructions: I discussed the assessment and treatment plan with the patient. The patient was provided an opportunity to ask questions and all were answered. The patient agreed with the plan and demonstrated an understanding of the instructions.  A copy of instructions were sent to the patient via MyChart unless otherwise noted below.     The patient was advised to call back or seek an in-person evaluation if the symptoms worsen or if the condition fails to improve as anticipated.  Time:  I spent 12 minutes with  the patient via telehealth technology discussing the above problems/concerns.    Amber Rigg, NP

## 2022-10-01 NOTE — Patient Instructions (Signed)
  Lynnette Caffey, thank you for joining Claiborne Rigg, NP for today's virtual visit.  While this provider is not your primary care provider (PCP), if your PCP is located in our provider database this encounter information will be shared with them immediately following your visit.   A [facility] MyChart account gives you access to today's visit and all your visits, tests, and labs performed at [facility] " click here if you don't have a [facility] MyChart account or go to [email or web address]  Consent: (Patient) Durel Salts Jacko provided verbal consent for this virtual visit at the beginning of the encounter.  Current Medications:  Current Outpatient Medications:    doxycycline (VIBRA-TABS) 100 MG tablet, Take 1 tablet (100 mg total) by mouth 2 (two) times daily., Disp: 20 tablet, Rfl: 0   albuterol (VENTOLIN HFA) 108 (90 Base) MCG/ACT inhaler, Inhale 2 puffs into the lungs every 6 (six) hours as needed for wheezing or shortness of breath., Disp: 8.5 g, Rfl: 2   EPINEPHrine 0.3 MG/0.3ML SOSY, Inject 0.3 mLs (0.3 mg total) into the muscle as needed for anaphylaxis, Disp: 1 each, Rfl: 2   estradiol (CLIMARA - DOSED IN MG/24 HR) 0.05 mg/24hr patch, 0.05 mg once a week., Disp: , Rfl:    fexofenadine-pseudoephedrine (ALLEGRA-D) 60-120 MG 12 hr tablet, Take 1 tablet by mouth daily., Disp: , Rfl:    latanoprost (XALATAN) 0.005 % ophthalmic solution, Place 1 drop into both eyes at bedtime., Disp: , Rfl:    Multiple Vitamin (MULTIVITAMIN WITH MINERALS) TABS tablet, Take 1 tablet by mouth daily., Disp: , Rfl:    progesterone (PROMETRIUM) 100 MG capsule, Take 100 mg by mouth daily., Disp: , Rfl:    Medications ordered in this encounter:  Meds ordered this encounter  Medications   doxycycline (VIBRA-TABS) 100 MG tablet    Sig: Take 1 tablet (100 mg total) by mouth 2 (two) times daily.    Dispense:  20 tablet    Refill:  0    Order Specific Question:   Supervising  Provider    Answer:   Merrilee Jansky [id number]     *If you need refills on other medications prior to your next appointment, please contact your pharmacy*  Follow-Up: Call back or seek an in-person evaluation if the symptoms worsen or if the condition fails to improve as anticipated.  [facility] [phone]    If you have been instructed to have an in-person evaluation today at a local Urgent Care facility, please use the link below. It will take you to a list of all of our available [facility], including address, phone number and hours of operation. Please do not delay care.  [facility]  If you or a family member do not have a primary care provider, use the link below to schedule a visit and establish care. When you choose a [facility] primary care physician or advanced practice provider, you gain a long-term partner in health. Find a Primary Care Provider  Learn more about [facility]'s in-office and virtual care options:  - Get Care Now

## 2022-10-05 ENCOUNTER — Encounter (HOSPITAL_BASED_OUTPATIENT_CLINIC_OR_DEPARTMENT_OTHER): Payer: Self-pay

## 2022-10-05 ENCOUNTER — Ambulatory Visit (HOSPITAL_BASED_OUTPATIENT_CLINIC_OR_DEPARTMENT_OTHER): Payer: 59

## 2022-10-05 DIAGNOSIS — M542 Cervicalgia: Secondary | ICD-10-CM

## 2022-10-05 DIAGNOSIS — M546 Pain in thoracic spine: Secondary | ICD-10-CM

## 2022-10-05 NOTE — Therapy (Signed)
OUTPATIENT PHYSICAL THERAPY CERVICAL TREATMENT   Patient Name: Amber Klein MRN: [id number] DOB:08-26-1950, 72 y.o., female Today's Date: 10/05/2022  END OF SESSION:  PT End of Session - 10/05/22 0849     Visit Number 11    Number of Visits 14    Date for PT Re-Evaluation 10/19/22    Authorization Type [employer]    PT Start Time 0847    PT Stop Time 0932    PT Time Calculation (min) 45 min    Activity Tolerance Patient tolerated treatment well    Behavior During Therapy WFL for tasks assessed/performed                    Past Medical History:  Diagnosis Date   Arthritis    Asthma    mild - allergy season induced   Cancer (HCC)    basal cell skin cancer   Complication of anesthesia    Sensitive to propofol - and BP drops   Dysrhythmia    Ear infection    Environmental and seasonal allergies    Femoral hernia of right side with obstruction 01/18/2018   GERD (gastroesophageal reflux disease)    no longer having issues takes TUMS occ.   History of kidney stones    Hypotension    Irritant contact dermatitis associated with stoma 06/25/2020   Pneumonia 02/2016   walking pneumonia    RBBB    Sinus infection    Past Surgical History:  Procedure Laterality Date   CATARACT EXTRACTION     CESAREAN SECTION  1979   1985   CO2 LASER APPLICATION N/A 05/10/2015   Procedure: CO2 LASER APPLICATION;  Surgeon: Waynard Reeds, MD;  Location: [facility];  Service: Gynecology;  Laterality: N/A;   COLON SURGERY  04/10/2020   with sepsis and colostomy   COLONOSCOPY     COLONOSCOPY WITH PROPOFOL N/A 11/11/2020   Procedure: COLONOSCOPY WITH PROPOFOL;  Surgeon: Rachael Fee, MD;  Location: [facility];  Service: Endoscopy;  Laterality: N/A;  NEEDS PREVISIT WITH ANESTHESIA   FOOT SURGERY Right 2009   INCISIONAL HERNIA REPAIR N/A 04/06/2021   Procedure: HERNIA REPAIR INCISIONAL REPAIR WITH MESH;  Surgeon: Axel Filler, MD;  Location: [facility] OR;  Service: General;  Laterality:  N/A;   INGUINAL HERNIA REPAIR Right 01/18/2018   Procedure: OPEN REPAIR INCARCERATED RIGHT FEMORAL HERNIA WITH MESH;  Surgeon: Claud Kelp, MD;  Location: [facility] OR;  Service: General;  Laterality: Right;  GENERAL / TAP BLOCK   INSERTION OF MESH N/A 04/06/2021   Procedure: INSERTION OF MESH;  Surgeon: Axel Filler, MD;  Location: [facility] OR;  Service: General;  Laterality: N/A;   KNEE SURGERY Left 2016   LAPAROTOMY N/A 04/06/2021   Procedure: EXPLORATORY LAPAROTOMY;  Surgeon: Axel Filler, MD;  Location: [facility] OR;  Service: General;  Laterality: N/A;   LYSIS OF ADHESION N/A 04/06/2021   Procedure: LYSIS OF ADHESION;  Surgeon: Axel Filler, MD;  Location: [facility] OR;  Service: General;  Laterality: N/A;   ORIF ELBOW FRACTURE Left 07/04/2014   Procedure: OPEN REDUCTION INTERNAL FIXATION (ORIF) ELBOW/OLECRANON FRACTURE;  Surgeon: Cammy Copa, MD;  Location: [facility];  Service: Orthopedics;  Laterality: Left;   STOMA DILATATION N/A 12/08/2020   Procedure: STOMA DILATATION WITH STOMAPLASTY;  Surgeon: Luretha Murphy, MD;  Location: [facility];  Service: General;  Laterality: N/A;   VULVECTOMY N/A 08/10/2016   Procedure: WIDE EXCISION VULVECTOMY;  Surgeon: Waynard Reeds, MD;  Location: [facility];  Service: Gynecology;  Laterality: N/A;   XI ROBOTIC ASSISTED COLOSTOMY TAKEDOWN N/A 01/05/2021   Procedure: ROBOTIC COLOSTOMY REVERSAL WITH LAPAROSCOPIC LYSIS OF ADHESIONS;  Surgeon: Romie Levee, MD;  Location: [facility];  Service: General;  Laterality: N/A;   Patient Active Problem List   Diagnosis Date Noted   S/P hernia repair 04/06/2021   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   Colonic obstruction (HCC) 12/08/2020   Diverticulosis of colon 10/08/2020   Intractable vomiting 07/06/2020   Hypoxia 07/06/2020   Irritant contact dermatitis associated with stoma 06/25/2020   Diverticulitis 05/21/2020   GERD (gastroesophageal reflux disease)    Acute suppurative peritonitis (HCC) 04/11/2020   Perforated sigmoid  colon (HCC) 04/10/2020   Other constipation 04/06/2020   Femoral hernia of right side with obstruction 01/18/2018   Elbow fracture 07/04/2014     REFERRING PROVIDER: Julio Sicks, MD  REFERRING DIAG: [phone] (ICD-10-CM) - Spinal stenosis, cervical region  THERAPY DIAG:  Cervicalgia  Pain in thoracic spine  Stiffness of unspecified joint, not elsewhere classified  Rationale for Evaluation and Treatment: Rehabilitation  ONSET DATE: 06/02/2022  SUBJECTIVE:                                                                                                                                                                                                         SUBJECTIVE STATEMENT: Pt reports relief fom DN, able to turn head better. Has some discomfort (2-3/10) but denies pain.    Hand dominance: Right  PERTINENT HISTORY:  T1 Compression Fracture on 06/02/22 Per dx and CT/MRI findings  PMHx:  Arthritis, osteopenia, R BBB, R foot surgery in 2009, L knee surgery 2016, ORIF L elbow fx in 2016  PAIN:  NPRS:  0-.5/10 current at rest.  1.5/10 with R rotation, 2.5-3/10 with L rotation.  Worst:  4/10 Location:  L sided cervical, L proximal/superior scapula  PRECAUTIONS: Other: T1 compression fracture  WEIGHT BEARING RESTRICTIONS: none indicated on MD order  FALLS:  Has patient fallen in last 6 months? Yes. 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  PLOF: Independent.  Pt was able to perform all of her daily activities without cervical pain.  Pt was able to turn her head without difficulty.   PATIENT GOALS: to be able to turn her neck   OBJECTIVE:   FOTO 7/11: 59% (same as IE. Goal of 69%)  DIAGNOSTIC FINDINGS:  IMPRESSION: CT HEAD:   1. No acute intracranial abnormality. 2. Mild soft tissue swelling along the right parietal  scalp.   CT CERVICAL SPINE:  -Trace anterolisthesis of C7 on T1.  1.  Acute fracture through the anterior superior endplate of T1. Recommend further evaluation with a cervical spine MRI to exclude the possibility of ligamentous injury. 2. Likely moderate spinal canal stenosis at C5-C6.  MR Cervical Spine Wo Contrast 1. Mild T1 wedge compression fracture with bone marrow edema underlying the superior endplate. No associated ligamentous injury. 2. Right facet edema at C7-T1, which may be a source of local neck pain. 3. Moderate C6-7 spinal canal stenosis with severe bilateral, right worse than left neural foraminal stenosis. 4. Mild spinal canal stenosis at C4-5 and C5-6 with moderate right and severe left C4-5 neural foraminal stenosis.    Active ROM A/PROM (deg) eval AROM 6/13 AROM 7/25  Flexion     Extension     Right lateral flexion 17 20 29  deg  Left lateral flexion 8 15 with pain 23 deg. no pain, but felt uncomfortable  Right rotation 34 66 72  Left rotation 25 45 45   (Blank rows = not tested)    TODAY'S TREATMENT:     8/22 STM and TPR to L UT, LS, cervical ps, and SCM Instructed in SCM stretch Posterior shoulder rolls     8/15   Trigger Point Dry-Needling  Treatment instructions: Expect mild to moderate muscle soreness. S/S of pneumothorax if dry needled over a lung field, and to seek immediate medical attention should they occur. Patient verbalized understanding of these instructions and education.   Patient Consent Given: Yes Education (verbally/handout)provided: Yes Muscles treated: L C/S paraspinals, L UT Electrical stimulation performed: N/A Treatment response/outcome: signficant LTR eliticited    STM C/S paraspinals C3-6 Bilat UPA and CPA C3-6 grade III C3-6 side glide grade III Manual traction       Previous: Therex:  Reviewed current function, pain levels, and response to prior Rx.  Pt performed: Row with GTB 2x15 Extension with GTB 2x15 Bilateral ER with RTB 2x10 Horizontal abduction with RTB x15 Wall  angels x10 Doorway pec stretch 15s x3  Chin tucks with ball at wall- 3s x15   Manual Therapy: STM to cervical paraspinals bilaterally, sub occipital in supine  STM to thoracic paraspinals and scapular mm, UT, levator in seated position Cupping to L upper trap and levator   PATIENT EDUCATION:  Education details: objective findings, goal progress, exercise form, relevant anatomy, rationale of interventions, and POC.  PT answered pt's questions.   Person educated: Patient Education method: Medical illustrator, verbal cues, and handout Education comprehension: verbalized understanding, returned demonstration, verbal cues required  HOME EXERCISE PROGRAM: Access Code: [email or web address] URL: [email or web address] Date: 07/28/2022 Prepared by: Aaron Edelman    ASSESSMENT:  CLINICAL IMPRESSION: Focused on manual treatment today to aid with abolishment of tightness and trigger points present throughout L neck and shoulder. Pt was especially tender in upper trap adjacent to ACJ as well as entirety of SCM. Pt did report relief following MT. Will monitor response and continue to progress as tolerated. Pt would benefit from addition DN treatments due to positive results with this previously. Pt would eventually like to transition to gym program at [facility] with instruction to avoid worsening her cervical stenosis and posture.  OBJECTIVE IMPAIRMENTS: decreased activity tolerance, decreased ROM, decreased strength, hypomobility, increased fascial restrictions, increased muscle spasms, and pain.   ACTIVITY LIMITATIONS: carrying and lifting  PARTICIPATION LIMITATIONS: driving and shopping  PERSONAL FACTORS: 1-2 comorbidities: T1 compression fracture and arthritis  are also affecting patient's  functional outcome.   REHAB POTENTIAL: Good  CLINICAL DECISION MAKING: Stable/uncomplicated  EVALUATION COMPLEXITY: Low   GOALS:  SHORT TERM GOALS: Target date: 08/03/2022   Pt will  report at least a 25% improvement in stiffness and turning her head for improved daily mobility.  Baseline:  Goal status: GOAL MET--7/25  2.  Pt will demo at least a 6-8 deg increase in cervical Sb'ing AROM and 10 deg in cervical rotation AROM bilat for improved stiffness and daily mobility.  Baseline:  Goal status: 75% MET----07/27/22   LONG TERM GOALS: Target date: 10/19/2022  Pt will be able to turn her head while driving without significant pain and difficulty.  Baseline:  Goal status: PARTIALLY MET--7/25  2.  Pt will be independent with HEP for improved pain, ROM, and function.   Baseline:  Goal status: PROGRESSING--7/25  3.  Pt will be able to perform her ADLs and IADLs without significant pain and difficulty.  Baseline:  Goal status: GOAL MET--7/25  4.  Pt will demo at least 55 deg of cervical rotation AROM bilat for improved stiffness and daily mobility. Baseline:  Goal status: 60% MET     PLAN:  PT FREQUENCY:  1x/wk  PT DURATION: 4-6 weeks  PLANNED INTERVENTIONS: Therapeutic exercises, Therapeutic activity, Neuromuscular re-education, Patient/Family education, Self Care, Joint mobilization, Aquatic Therapy, Dry Needling, Electrical stimulation, Cryotherapy, Moist heat, Taping, Ultrasound, Manual therapy, and Re-evaluation  PLAN FOR NEXT SESSION: Cont with ther ex and STW per healing considerations per T1 compression fracture.  Cont with dry needling.  Riki Altes, PTA  10/05/22 9:48 AM

## 2022-10-10 ENCOUNTER — Ambulatory Visit (HOSPITAL_BASED_OUTPATIENT_CLINIC_OR_DEPARTMENT_OTHER): Payer: 59 | Admitting: Physical Therapy

## 2022-10-11 ENCOUNTER — Ambulatory Visit (HOSPITAL_BASED_OUTPATIENT_CLINIC_OR_DEPARTMENT_OTHER): Payer: 59 | Admitting: Physical Therapy

## 2022-10-11 ENCOUNTER — Encounter (HOSPITAL_BASED_OUTPATIENT_CLINIC_OR_DEPARTMENT_OTHER): Payer: Self-pay | Admitting: Physical Therapy

## 2022-10-11 DIAGNOSIS — M546 Pain in thoracic spine: Secondary | ICD-10-CM | POA: Diagnosis not present

## 2022-10-11 DIAGNOSIS — M542 Cervicalgia: Secondary | ICD-10-CM

## 2022-10-11 NOTE — Therapy (Signed)
OUTPATIENT PHYSICAL THERAPY CERVICAL TREATMENT   Patient Name: Amber Klein MRN: [id number] DOB:[birth year], 72 y.o., female Today's Date: 10/11/2022  END OF SESSION:  PT End of Session - 10/11/22 1523     Visit Number 12    Number of Visits 14    Date for PT Re-Evaluation 10/19/22    Authorization Type [employer]    PT Start Time 1522    PT Stop Time 1600    PT Time Calculation (min) 38 min    Activity Tolerance Patient tolerated treatment well    Behavior During Therapy WFL for tasks assessed/performed                    Past Medical History:  Diagnosis Date   Arthritis    Asthma    mild - allergy season induced   Cancer (HCC)    basal cell skin cancer   Complication of anesthesia    Sensitive to propofol - and BP drops   Dysrhythmia    Ear infection    Environmental and seasonal allergies    Femoral hernia of right side with obstruction 01/18/2018   GERD (gastroesophageal reflux disease)    no longer having issues takes TUMS occ.   History of kidney stones    Hypotension    Irritant contact dermatitis associated with stoma 06/25/2020   Pneumonia 02/2016   walking pneumonia    RBBB    Sinus infection    Past Surgical History:  Procedure Laterality Date   CATARACT EXTRACTION     CESAREAN SECTION  1979   1985   CO2 LASER APPLICATION N/A 05/10/2015   Procedure: CO2 LASER APPLICATION;  Surgeon: Waynard Reeds, MD;  Location: [facility];  Service: Gynecology;  Laterality: N/A;   COLON SURGERY  04/10/2020   with sepsis and colostomy   COLONOSCOPY     COLONOSCOPY WITH PROPOFOL N/A 11/11/2020   Procedure: COLONOSCOPY WITH PROPOFOL;  Surgeon: Rachael Fee, MD;  Location: [facility];  Service: Endoscopy;  Laterality: N/A;  NEEDS PREVISIT WITH ANESTHESIA   FOOT SURGERY Right 2009   INCISIONAL HERNIA REPAIR N/A 04/06/2021   Procedure: HERNIA REPAIR INCISIONAL REPAIR WITH MESH;  Surgeon: Axel Filler, MD;  Location: [facility] OR;  Service: General;  Laterality:  N/A;   INGUINAL HERNIA REPAIR Right 01/18/2018   Procedure: OPEN REPAIR INCARCERATED RIGHT FEMORAL HERNIA WITH MESH;  Surgeon: Claud Kelp, MD;  Location: [facility] OR;  Service: General;  Laterality: Right;  GENERAL / TAP BLOCK   INSERTION OF MESH N/A 04/06/2021   Procedure: INSERTION OF MESH;  Surgeon: Axel Filler, MD;  Location: [facility] OR;  Service: General;  Laterality: N/A;   KNEE SURGERY Left 2016   LAPAROTOMY N/A 04/06/2021   Procedure: EXPLORATORY LAPAROTOMY;  Surgeon: Axel Filler, MD;  Location: [facility] OR;  Service: General;  Laterality: N/A;   LYSIS OF ADHESION N/A 04/06/2021   Procedure: LYSIS OF ADHESION;  Surgeon: Axel Filler, MD;  Location: [facility] OR;  Service: General;  Laterality: N/A;   ORIF ELBOW FRACTURE Left 07/04/2014   Procedure: OPEN REDUCTION INTERNAL FIXATION (ORIF) ELBOW/OLECRANON FRACTURE;  Surgeon: Cammy Copa, MD;  Location: [facility];  Service: Orthopedics;  Laterality: Left;   STOMA DILATATION N/A 12/08/2020   Procedure: STOMA DILATATION WITH STOMAPLASTY;  Surgeon: Luretha Murphy, MD;  Location: [facility];  Service: General;  Laterality: N/A;   VULVECTOMY N/A 08/10/2016   Procedure: WIDE EXCISION VULVECTOMY;  Surgeon: Waynard Reeds, MD;  Location: [facility];  Service: Gynecology;  Laterality: N/A;   XI ROBOTIC ASSISTED COLOSTOMY TAKEDOWN N/A 01/05/2021   Procedure: ROBOTIC COLOSTOMY REVERSAL WITH LAPAROSCOPIC LYSIS OF ADHESIONS;  Surgeon: Romie Levee, MD;  Location: [facility];  Service: General;  Laterality: N/A;   Patient Active Problem List   Diagnosis Date Noted   S/P hernia repair 04/06/2021   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   Colonic obstruction (HCC) 12/08/2020   Diverticulosis of colon 10/08/2020   Intractable vomiting 07/06/2020   Hypoxia 07/06/2020   Irritant contact dermatitis associated with stoma 06/25/2020   Diverticulitis 05/21/2020   GERD (gastroesophageal reflux disease)    Acute suppurative peritonitis (HCC) 04/11/2020   Perforated sigmoid  colon (HCC) 04/10/2020   Other constipation 04/06/2020   Femoral hernia of right side with obstruction 01/18/2018   Elbow fracture 07/04/2014     REFERRING PROVIDER: Julio Sicks, MD  REFERRING DIAG: [phone] (ICD-10-CM) - Spinal stenosis, cervical region  THERAPY DIAG:  Cervicalgia  Pain in thoracic spine  Stiffness of unspecified joint, not elsewhere classified  Rationale for Evaluation and Treatment: Rehabilitation  ONSET DATE: 06/02/2022  SUBJECTIVE:                                                                                                                                                                                                         SUBJECTIVE STATEMENT: Pt reports she has been doing pretty good. Made good progress. DN really helped. Still a knot in there.    Hand dominance: Right  PERTINENT HISTORY:  T1 Compression Fracture on 06/02/22 Per dx and CT/MRI findings  PMHx:  Arthritis, osteopenia, R BBB, R foot surgery in 2009, L knee surgery 2016, ORIF L elbow fx in 2016  PAIN:  NPRS:  0/10 current at rest.  2-3/10 with R rotation, 3-4-3/10 with L rotation.  Worst:  4/10 Location:  L sided cervical, L proximal/superior scapula  PRECAUTIONS: Other: T1 compression fracture  WEIGHT BEARING RESTRICTIONS: none indicated on MD order  FALLS:  Has patient fallen in last 6 months? Yes. 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  PLOF: Independent.  Pt was able to perform all of her daily activities without cervical pain.  Pt was able to turn her head without difficulty.   PATIENT GOALS: to be able to turn her neck   OBJECTIVE:   FOTO 7/11: 59% (same as IE. Goal of 69%)  DIAGNOSTIC FINDINGS:  IMPRESSION: CT HEAD:   1. No acute intracranial abnormality. 2. Mild soft tissue swelling along the  right parietal scalp.   CT CERVICAL SPINE:  -Trace anterolisthesis of C7 on T1.   1. Acute fracture through the anterior superior endplate of T1. Recommend further evaluation with a cervical spine MRI to exclude the possibility of ligamentous injury. 2. Likely moderate spinal canal stenosis at C5-C6.  MR Cervical Spine Wo Contrast 1. Mild T1 wedge compression fracture with bone marrow edema underlying the superior endplate. No associated ligamentous injury. 2. Right facet edema at C7-T1, which may be a source of local neck pain. 3. Moderate C6-7 spinal canal stenosis with severe bilateral, right worse than left neural foraminal stenosis. 4. Mild spinal canal stenosis at C4-5 and C5-6 with moderate right and severe left C4-5 neural foraminal stenosis.    Active ROM A/PROM (deg) eval AROM 6/13 AROM 7/25  Flexion     Extension     Right lateral flexion 17 20 29  deg  Left lateral flexion 8 15 with pain 23 deg. no pain, but felt uncomfortable  Right rotation 34 66 72  Left rotation 25 45 45   (Blank rows = not tested)    TODAY'S TREATMENT:   10/11/22 Manual: STM to L UT pre and post dry needling for trigger point identification and muscular relaxation.  Trigger Point Dry-Needling  Treatment instructions: Expect mild to moderate muscle soreness. S/S of pneumothorax if dry needled over a lung field, and to seek immediate medical attention should they occur. Patient verbalized understanding of these instructions and education.  Patient Consent Given: Yes Education handout provided: Previously provided Muscles treated: L UT Electrical stimulation performed: No Parameters: N/A Treatment response/outcome: twitch response elicitted, decrease in tissue tension, improvement in symptoms  Standing Row GTB 1 x 15 Standing shoulder extension GTB 1x 15  Standing scap retractions with GH ER GTB 1 x 15 Standing shoulder horizontal abduction GTB 1 x 15   8/22 STM and TPR to L UT, LS, cervical ps, and SCM Instructed in SCM stretch Posterior shoulder  rolls     8/15   Trigger Point Dry-Needling  Treatment instructions: Expect mild to moderate muscle soreness. S/S of pneumothorax if dry needled over a lung field, and to seek immediate medical attention should they occur. Patient verbalized understanding of these instructions and education.   Patient Consent Given: Yes Education (verbally/handout)provided: Yes Muscles treated: L C/S paraspinals, L UT Electrical stimulation performed: N/A Treatment response/outcome: signficant LTR eliticited    STM C/S paraspinals C3-6 Bilat UPA and CPA C3-6 grade III C3-6 side glide grade III Manual traction       Previous: Therex:  Reviewed current function, pain levels, and response to prior Rx.  Pt performed: Row with GTB 2x15 Extension with GTB 2x15 Bilateral ER with RTB 2x10 Horizontal abduction with RTB x15 Wall angels x10 Doorway pec stretch 15s x3  Chin tucks with ball at wall- 3s x15   Manual Therapy: STM to cervical paraspinals bilaterally, sub occipital in supine  STM to thoracic paraspinals and scapular mm, UT, levator in seated position Cupping to L upper trap and levator   PATIENT EDUCATION:  Education details: objective findings, goal progress, exercise form, relevant anatomy, rationale of interventions, and POC.  PT answered pt's questions.   Person educated: Patient Education method: Medical illustrator, verbal cues, and handout Education comprehension: verbalized understanding, returned demonstration, verbal cues required  HOME EXERCISE PROGRAM: Access Code: [email or web address] URL: [email or web address] Date: 07/28/2022 Prepared by: Aaron Edelman  10/11/22- Seated Upper Trapezius Stretch  - 1 x daily - 7 x weekly -  3 reps - 20 -30 second hold  ASSESSMENT:  CLINICAL IMPRESSION: Patient with continued and hyperactive L UT. Continued with manual and DN with decrease in tissue tension and twitch response. Added increased resistance to postural  strengthening which is tolerated. Patient will continue to benefit from physical therapy in order to improve function and reduce impairment.   OBJECTIVE IMPAIRMENTS: decreased activity tolerance, decreased ROM, decreased strength, hypomobility, increased fascial restrictions, increased muscle spasms, and pain.   ACTIVITY LIMITATIONS: carrying and lifting  PARTICIPATION LIMITATIONS: driving and shopping  PERSONAL FACTORS: 1-2 comorbidities: T1 compression fracture and arthritis  are also affecting patient's functional outcome.   REHAB POTENTIAL: Good  CLINICAL DECISION MAKING: Stable/uncomplicated  EVALUATION COMPLEXITY: Low   GOALS:  SHORT TERM GOALS: Target date: 08/03/2022   Pt will report at least a 25% improvement in stiffness and turning her head for improved daily mobility.  Baseline:  Goal status: GOAL MET--7/25  2.  Pt will demo at least a 6-8 deg increase in cervical Sb'ing AROM and 10 deg in cervical rotation AROM bilat for improved stiffness and daily mobility.  Baseline:  Goal status: 75% MET----07/27/22   LONG TERM GOALS: Target date: 10/19/2022  Pt will be able to turn her head while driving without significant pain and difficulty.  Baseline:  Goal status: PARTIALLY MET--7/25  2.  Pt will be independent with HEP for improved pain, ROM, and function.   Baseline:  Goal status: PROGRESSING--7/25  3.  Pt will be able to perform her ADLs and IADLs without significant pain and difficulty.  Baseline:  Goal status: GOAL MET--7/25  4.  Pt will demo at least 55 deg of cervical rotation AROM bilat for improved stiffness and daily mobility. Baseline:  Goal status: 60% MET     PLAN:  PT FREQUENCY:  1x/wk  PT DURATION: 4-6 weeks  PLANNED INTERVENTIONS: Therapeutic exercises, Therapeutic activity, Neuromuscular re-education, Patient/Family education, Self Care, Joint mobilization, Aquatic Therapy, Dry Needling, Electrical stimulation, Cryotherapy, Moist heat,  Taping, Ultrasound, Manual therapy, and Re-evaluation  PLAN FOR NEXT SESSION: Cont with ther ex and STW per healing considerations per T1 compression fracture.  Cont with dry needling.; reassess  3:24 PM, 10/11/22 Wyman Songster PT, DPT Physical Therapist at [facility]

## 2022-10-17 ENCOUNTER — Ambulatory Visit (HOSPITAL_BASED_OUTPATIENT_CLINIC_OR_DEPARTMENT_OTHER): Payer: 59 | Attending: Neurosurgery | Admitting: Physical Therapy

## 2022-10-17 ENCOUNTER — Encounter (HOSPITAL_BASED_OUTPATIENT_CLINIC_OR_DEPARTMENT_OTHER): Payer: Self-pay | Admitting: Physical Therapy

## 2022-10-17 DIAGNOSIS — M542 Cervicalgia: Secondary | ICD-10-CM | POA: Diagnosis present

## 2022-10-17 DIAGNOSIS — M546 Pain in thoracic spine: Secondary | ICD-10-CM | POA: Insufficient documentation

## 2022-10-17 NOTE — Therapy (Signed)
OUTPATIENT PHYSICAL THERAPY CERVICAL TREATMENT   Patient Name: Amber Klein MRN: [id number] DOB:March 06, 1950, 72 y.o., female Today's Date: 10/17/2022  Progress Note   Reporting Period 09/07/22 to 10/17/22   See note below for Objective Data and Assessment of Progress/Goals   END OF SESSION:  PT End of Session - 10/17/22 1143     Visit Number 13    Number of Visits 15    Date for PT Re-Evaluation 10/31/22    Authorization Type [employer]    PT Start Time 1145    PT Stop Time 1223    PT Time Calculation (min) 38 min    Activity Tolerance Patient tolerated treatment well    Behavior During Therapy WFL for tasks assessed/performed                    Past Medical History:  Diagnosis Date   Arthritis    Asthma    mild - allergy season induced   Cancer (HCC)    basal cell skin cancer   Complication of anesthesia    Sensitive to propofol - and BP drops   Dysrhythmia    Ear infection    Environmental and seasonal allergies    Femoral hernia of right side with obstruction 01/18/2018   GERD (gastroesophageal reflux disease)    no longer having issues takes TUMS occ.   History of kidney stones    Hypotension    Irritant contact dermatitis associated with stoma 06/25/2020   Pneumonia 02/2016   walking pneumonia    RBBB    Sinus infection    Past Surgical History:  Procedure Laterality Date   CATARACT EXTRACTION     CESAREAN SECTION  1979   1985   CO2 LASER APPLICATION N/A 05/10/2015   Procedure: CO2 LASER APPLICATION;  Surgeon: Waynard Reeds, MD;  Location: [facility];  Service: Gynecology;  Laterality: N/A;   COLON SURGERY  04/10/2020   with sepsis and colostomy   COLONOSCOPY     COLONOSCOPY WITH PROPOFOL N/A 11/11/2020   Procedure: COLONOSCOPY WITH PROPOFOL;  Surgeon: Rachael Fee, MD;  Location: [facility];  Service: Endoscopy;  Laterality: N/A;  NEEDS PREVISIT WITH ANESTHESIA   FOOT SURGERY Right 2009   INCISIONAL HERNIA REPAIR N/A 04/06/2021    Procedure: HERNIA REPAIR INCISIONAL REPAIR WITH MESH;  Surgeon: Axel Filler, MD;  Location: [facility] OR;  Service: General;  Laterality: N/A;   INGUINAL HERNIA REPAIR Right 01/18/2018   Procedure: OPEN REPAIR INCARCERATED RIGHT FEMORAL HERNIA WITH MESH;  Surgeon: Claud Kelp, MD;  Location: [facility] OR;  Service: General;  Laterality: Right;  GENERAL / TAP BLOCK   INSERTION OF MESH N/A 04/06/2021   Procedure: INSERTION OF MESH;  Surgeon: Axel Filler, MD;  Location: [facility] OR;  Service: General;  Laterality: N/A;   KNEE SURGERY Left 2016   LAPAROTOMY N/A 04/06/2021   Procedure: EXPLORATORY LAPAROTOMY;  Surgeon: Axel Filler, MD;  Location: [facility] OR;  Service: General;  Laterality: N/A;   LYSIS OF ADHESION N/A 04/06/2021   Procedure: LYSIS OF ADHESION;  Surgeon: Axel Filler, MD;  Location: [facility] OR;  Service: General;  Laterality: N/A;   ORIF ELBOW FRACTURE Left 07/04/2014   Procedure: OPEN REDUCTION INTERNAL FIXATION (ORIF) ELBOW/OLECRANON FRACTURE;  Surgeon: Cammy Copa, MD;  Location: [facility];  Service: Orthopedics;  Laterality: Left;   STOMA DILATATION N/A 12/08/2020   Procedure: STOMA DILATATION WITH STOMAPLASTY;  Surgeon: Luretha Murphy, MD;  Location: [facility];  Service: General;  Laterality: N/A;  VULVECTOMY N/A 08/10/2016   Procedure: WIDE EXCISION VULVECTOMY;  Surgeon: Waynard Reeds, MD;  Location: [facility];  Service: Gynecology;  Laterality: N/A;   XI ROBOTIC ASSISTED COLOSTOMY TAKEDOWN N/A 01/05/2021   Procedure: ROBOTIC COLOSTOMY REVERSAL WITH LAPAROSCOPIC LYSIS OF ADHESIONS;  Surgeon: Romie Levee, MD;  Location: [facility];  Service: General;  Laterality: N/A;   Patient Active Problem List   Diagnosis Date Noted   S/P hernia repair 04/06/2021   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   Colonic obstruction (HCC) 12/08/2020   Diverticulosis of colon 10/08/2020   Intractable vomiting 07/06/2020   Hypoxia 07/06/2020   Irritant contact dermatitis associated with stoma 06/25/2020    Diverticulitis 05/21/2020   GERD (gastroesophageal reflux disease)    Acute suppurative peritonitis (HCC) 04/11/2020   Perforated sigmoid colon (HCC) 04/10/2020   Other constipation 04/06/2020   Femoral hernia of right side with obstruction 01/18/2018   Elbow fracture 07/04/2014     REFERRING PROVIDER: Julio Sicks, MD  REFERRING DIAG: [phone] (ICD-10-CM) - Spinal stenosis, cervical region  THERAPY DIAG:  Cervicalgia  Pain in thoracic spine  Stiffness of unspecified joint, not elsewhere classified  Rationale for Evaluation and Treatment: Rehabilitation  ONSET DATE: 06/02/2022  SUBJECTIVE:                                                                                                                                                                                                         SUBJECTIVE STATEMENT: Pt reports she is overall feeling good. Some soreness. Patient states 90% improvement with PT intervention. Remaining deficit is in L sided mobility. HEP going well. Feels she needs a few more sessions to try to get that knot out of there   Hand dominance: Right  PERTINENT HISTORY:  T1 Compression Fracture on 06/02/22 Per dx and CT/MRI findings  PMHx:  Arthritis, osteopenia, R BBB, R foot surgery in 2009, L knee surgery 2016, ORIF L elbow fx in 2016  PAIN:  NPRS:  0/10 current at rest.  2-3/10 with R rotation, 3-4-3/10 with L rotation.  Worst:  4/10 Location:  L sided cervical, L proximal/superior scapula  PRECAUTIONS: Other: T1 compression fracture  WEIGHT BEARING RESTRICTIONS: none indicated on MD order  FALLS:  Has patient fallen in last 6 months? Yes. 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  PLOF: Independent.  Pt was able to perform all of her daily activities without cervical pain.  Pt was able to turn her head without difficulty.  PATIENT GOALS: to be able to turn her  neck   OBJECTIVE:   FOTO 7/11: 59% (same as IE. Goal of 69%) 10/17/22 63 % function  DIAGNOSTIC FINDINGS:  IMPRESSION: CT HEAD:   1. No acute intracranial abnormality. 2. Mild soft tissue swelling along the right parietal scalp.   CT CERVICAL SPINE:  -Trace anterolisthesis of C7 on T1.  1. Acute fracture through the anterior superior endplate of T1. Recommend further evaluation with a cervical spine MRI to exclude the possibility of ligamentous injury. 2. Likely moderate spinal canal stenosis at C5-C6.  MR Cervical Spine Wo Contrast 1. Mild T1 wedge compression fracture with bone marrow edema underlying the superior endplate. No associated ligamentous injury. 2. Right facet edema at C7-T1, which may be a source of local neck pain. 3. Moderate C6-7 spinal canal stenosis with severe bilateral, right worse than left neural foraminal stenosis. 4. Mild spinal canal stenosis at C4-5 and C5-6 with moderate right and severe left C4-5 neural foraminal stenosis.    Active ROM A/PROM (deg) eval AROM 6/13 AROM 7/25 AROM 10/17/22  Flexion      Extension      Right lateral flexion 17 20 29  deg 26 Improves to 34 following DN  Left lateral flexion 8 15 with pain 23 deg. no pain, but felt uncomfortable 15 Improves to 33 following DN  Right rotation 34 66 72 63 improves to 72 following DN  Left rotation 25 45 45 65  mproves to 70 following DN   (Blank rows = not tested)    TODAY'S TREATMENT:  10/17/22 Reassessment Manual: STM to L UT pre and post dry needling for trigger point identification and muscular relaxation.  Trigger Point Dry-Needling  Treatment instructions: Expect mild to moderate muscle soreness. S/S of pneumothorax if dry needled over a lung field, and to seek immediate medical attention should they occur. Patient verbalized understanding of these instructions and education.  Patient Consent Given: Yes Education handout provided: Previously provided Muscles treated:  L UT Electrical stimulation performed: No Parameters: N/A Treatment response/outcome: twitch response elicitted, decrease in tissue tension, improvement in symptoms Standing scap retractions with GH ER GTB 2 x 15 Standing shoulder horizontal abduction GTB 2 x 15   10/11/22 Manual: STM to L UT pre and post dry needling for trigger point identification and muscular relaxation.  Trigger Point Dry-Needling  Treatment instructions: Expect mild to moderate muscle soreness. S/S of pneumothorax if dry needled over a lung field, and to seek immediate medical attention should they occur. Patient verbalized understanding of these instructions and education.  Patient Consent Given: Yes Education handout provided: Previously provided Muscles treated: L UT Electrical stimulation performed: No Parameters: N/A Treatment response/outcome: twitch response elicitted, decrease in tissue tension, improvement in symptoms  Standing Row GTB 1 x 15 Standing shoulder extension GTB 1x 15  Standing scap retractions with GH ER GTB 1 x 15 Standing shoulder horizontal abduction GTB 1 x 15   8/22 STM and TPR to L UT, LS, cervical ps, and SCM Instructed in SCM stretch Posterior shoulder rolls     8/15   Trigger Point Dry-Needling  Treatment instructions: Expect mild to moderate muscle soreness. S/S of pneumothorax if dry needled over a lung field, and to seek immediate medical attention should they occur. Patient verbalized understanding of these instructions and education.   Patient Consent Given: Yes Education (verbally/handout)provided: Yes Muscles treated: L C/S paraspinals, L UT Electrical stimulation performed: N/A Treatment response/outcome: signficant LTR eliticited  STM C/S paraspinals C3-6 Bilat UPA and CPA C3-6 grade III C3-6 side glide grade III Manual traction       Previous: Therex:  Reviewed current function, pain levels, and response to prior Rx.  Pt performed: Row with GTB  2x15 Extension with GTB 2x15 Bilateral ER with RTB 2x10 Horizontal abduction with RTB x15 Wall angels x10 Doorway pec stretch 15s x3  Chin tucks with ball at wall- 3s x15   Manual Therapy: STM to cervical paraspinals bilaterally, sub occipital in supine  STM to thoracic paraspinals and scapular mm, UT, levator in seated position Cupping to L upper trap and levator   PATIENT EDUCATION:  Education details: objective findings, goal progress, exercise form, relevant anatomy, rationale of interventions, and POC.  PT answered pt's questions.   10/17/22: reassessment findings, POC, HEP Person educated: Patient Education method: Explanation and Demonstration, verbal cues, and handout Education comprehension: verbalized understanding, returned demonstration, verbal cues required  HOME EXERCISE PROGRAM: Access Code: [email or web address] URL: [email or web address] Date: 07/28/2022 Prepared by: Aaron Edelman  10/11/22- Seated Upper Trapezius Stretch  - 1 x daily - 7 x weekly - 3 reps - 20 -30 second hold  ASSESSMENT:  CLINICAL IMPRESSION: Patient has met 2/2 short term goals and 2/4 long term goals with another paritally met. Goals met with with ability to complete HEP and improvement in symptoms, strength, ROM, activity tolerance, and functional mobility. Patient with continued hyperactive and tender L UT. Continued with DN to L UT with improvement in cervical AROM following as seen above. Extending POC 1 x /week for 2 weeks as patient would like to continue to improve hyperactive UT and reduce symptoms. Patient will continue to benefit from skilled physical therapy in order to improve function and reduce impairment.    OBJECTIVE IMPAIRMENTS: decreased activity tolerance, decreased ROM, decreased strength, hypomobility, increased fascial restrictions, increased muscle spasms, and pain.   ACTIVITY LIMITATIONS: carrying and lifting  PARTICIPATION LIMITATIONS: driving and shopping  PERSONAL  FACTORS: 1-2 comorbidities: T1 compression fracture and arthritis  are also affecting patient's functional outcome.   REHAB POTENTIAL: Good  CLINICAL DECISION MAKING: Stable/uncomplicated  EVALUATION COMPLEXITY: Low   GOALS:  SHORT TERM GOALS: Target date: 08/03/2022   Pt will report at least a 25% improvement in stiffness and turning her head for improved daily mobility.  Baseline:  Goal status: GOAL MET--7/25  2.  Pt will demo at least a 6-8 deg increase in cervical Sb'ing AROM and 10 deg in cervical rotation AROM bilat for improved stiffness and daily mobility.  Baseline:  Goal status: 75% MET----07/27/22   LONG TERM GOALS: Target date: 10/19/2022  Pt will be able to turn her head while driving without significant pain and difficulty.  Baseline:  Goal status: PARTIALLY MET--7/25  2.  Pt will be independent with HEP for improved pain, ROM, and function.   Baseline:  Goal status: PROGRESSING--7/25  3.  Pt will be able to perform her ADLs and IADLs without significant pain and difficulty.  Baseline:  Goal status: GOAL MET--7/25  4.  Pt will demo at least 55 deg of cervical rotation AROM bilat for improved stiffness and daily mobility. Baseline:  Goal status: Goal MET     PLAN:  PT FREQUENCY:  1x/wk  PT DURATION: 2weeks  PLANNED INTERVENTIONS: Therapeutic exercises, Therapeutic activity, Neuromuscular re-education, Patient/Family education, Self Care, Joint mobilization, Aquatic Therapy, Dry Needling, Electrical stimulation, Cryotherapy, Moist heat, Taping, Ultrasound, Manual therapy, and Re-evaluation  PLAN FOR NEXT SESSION: Cont with ther ex and  STW per healing considerations per T1 compression fracture.  Cont with dry needling.  12:31 PM, 10/17/22 Wyman Songster PT, DPT Physical Therapist at [facility]

## 2022-10-25 ENCOUNTER — Ambulatory Visit (HOSPITAL_BASED_OUTPATIENT_CLINIC_OR_DEPARTMENT_OTHER): Payer: 59 | Admitting: Physical Therapy

## 2022-10-25 ENCOUNTER — Encounter (HOSPITAL_BASED_OUTPATIENT_CLINIC_OR_DEPARTMENT_OTHER): Payer: Self-pay | Admitting: Physical Therapy

## 2022-10-25 DIAGNOSIS — M542 Cervicalgia: Secondary | ICD-10-CM

## 2022-10-25 DIAGNOSIS — M546 Pain in thoracic spine: Secondary | ICD-10-CM

## 2022-10-25 NOTE — Therapy (Signed)
OUTPATIENT PHYSICAL THERAPY CERVICAL TREATMENT   Patient Name: Amber Klein MRN: [id number] DOB:1950-11-05, 72 y.o., female Today's Date: 10/25/2022  Progress Note   Reporting Period 09/07/22 to 10/17/22   See note below for Objective Data and Assessment of Progress/Goals   END OF SESSION:  PT End of Session - 10/25/22 1439     Visit Number 14    Number of Visits 15    Date for PT Re-Evaluation 10/31/22    Authorization Type [employer]    PT Start Time 1400    PT Stop Time 1430    PT Time Calculation (min) 30 min    Activity Tolerance Patient tolerated treatment well    Behavior During Therapy WFL for tasks assessed/performed                     Past Medical History:  Diagnosis Date   Arthritis    Asthma    mild - allergy season induced   Cancer (HCC)    basal cell skin cancer   Complication of anesthesia    Sensitive to propofol - and BP drops   Dysrhythmia    Ear infection    Environmental and seasonal allergies    Femoral hernia of right side with obstruction 01/18/2018   GERD (gastroesophageal reflux disease)    no longer having issues takes TUMS occ.   History of kidney stones    Hypotension    Irritant contact dermatitis associated with stoma 06/25/2020   Pneumonia 02/2016   walking pneumonia    RBBB    Sinus infection    Past Surgical History:  Procedure Laterality Date   CATARACT EXTRACTION     CESAREAN SECTION  1979   1985   CO2 LASER APPLICATION N/A 05/10/2015   Procedure: CO2 LASER APPLICATION;  Surgeon: Waynard Reeds, MD;  Location: [facility];  Service: Gynecology;  Laterality: N/A;   COLON SURGERY  04/10/2020   with sepsis and colostomy   COLONOSCOPY     COLONOSCOPY WITH PROPOFOL N/A 11/11/2020   Procedure: COLONOSCOPY WITH PROPOFOL;  Surgeon: Rachael Fee, MD;  Location: [facility];  Service: Endoscopy;  Laterality: N/A;  NEEDS PREVISIT WITH ANESTHESIA   FOOT SURGERY Right 2009   INCISIONAL HERNIA REPAIR N/A 04/06/2021    Procedure: HERNIA REPAIR INCISIONAL REPAIR WITH MESH;  Surgeon: Axel Filler, MD;  Location: [facility] OR;  Service: General;  Laterality: N/A;   INGUINAL HERNIA REPAIR Right 01/18/2018   Procedure: OPEN REPAIR INCARCERATED RIGHT FEMORAL HERNIA WITH MESH;  Surgeon: Claud Kelp, MD;  Location: [facility] OR;  Service: General;  Laterality: Right;  GENERAL / TAP BLOCK   INSERTION OF MESH N/A 04/06/2021   Procedure: INSERTION OF MESH;  Surgeon: Axel Filler, MD;  Location: [facility] OR;  Service: General;  Laterality: N/A;   KNEE SURGERY Left 2016   LAPAROTOMY N/A 04/06/2021   Procedure: EXPLORATORY LAPAROTOMY;  Surgeon: Axel Filler, MD;  Location: [facility] OR;  Service: General;  Laterality: N/A;   LYSIS OF ADHESION N/A 04/06/2021   Procedure: LYSIS OF ADHESION;  Surgeon: Axel Filler, MD;  Location: [facility] OR;  Service: General;  Laterality: N/A;   ORIF ELBOW FRACTURE Left 07/04/2014   Procedure: OPEN REDUCTION INTERNAL FIXATION (ORIF) ELBOW/OLECRANON FRACTURE;  Surgeon: Cammy Copa, MD;  Location: [facility];  Service: Orthopedics;  Laterality: Left;   STOMA DILATATION N/A 12/08/2020   Procedure: STOMA DILATATION WITH STOMAPLASTY;  Surgeon: Luretha Murphy, MD;  Location: [facility];  Service: General;  Laterality: N/A;  VULVECTOMY N/A 08/10/2016   Procedure: WIDE EXCISION VULVECTOMY;  Surgeon: Waynard Reeds, MD;  Location: [facility];  Service: Gynecology;  Laterality: N/A;   XI ROBOTIC ASSISTED COLOSTOMY TAKEDOWN N/A 01/05/2021   Procedure: ROBOTIC COLOSTOMY REVERSAL WITH LAPAROSCOPIC LYSIS OF ADHESIONS;  Surgeon: Romie Levee, MD;  Location: [facility];  Service: General;  Laterality: N/A;   Patient Active Problem List   Diagnosis Date Noted   S/P hernia repair 04/06/2021   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   Colonic obstruction (HCC) 12/08/2020   Diverticulosis of colon 10/08/2020   Intractable vomiting 07/06/2020   Hypoxia 07/06/2020   Irritant contact dermatitis associated with stoma 06/25/2020    Diverticulitis 05/21/2020   GERD (gastroesophageal reflux disease)    Acute suppurative peritonitis (HCC) 04/11/2020   Perforated sigmoid colon (HCC) 04/10/2020   Other constipation 04/06/2020   Femoral hernia of right side with obstruction 01/18/2018   Elbow fracture 07/04/2014     REFERRING PROVIDER: Julio Sicks, MD  REFERRING DIAG: [phone] (ICD-10-CM) - Spinal stenosis, cervical region  THERAPY DIAG:  Cervicalgia  Pain in thoracic spine  Stiffness of unspecified joint, not elsewhere classified  Rationale for Evaluation and Treatment: Rehabilitation  ONSET DATE: 06/02/2022  SUBJECTIVE:                                                                                                                                                                                                         SUBJECTIVE STATEMENT: Pt states the neck is improving. She is looking fwd to DN to help relieve that lingering pain.    Hand dominance: Right  PERTINENT HISTORY:  T1 Compression Fracture on 06/02/22 Per dx and CT/MRI findings  PMHx:  Arthritis, osteopenia, R BBB, R foot surgery in 2009, L knee surgery 2016, ORIF L elbow fx in 2016  PAIN:  NPRS:  0/10 current at rest.  2-3/10 with R rotation, 3-4-3/10 with L rotation.  Worst:  4/10 Location:  L sided cervical, L proximal/superior scapula  PRECAUTIONS: Other: T1 compression fracture  WEIGHT BEARING RESTRICTIONS: none indicated on MD order  FALLS:  Has patient fallen in last 6 months? Yes. 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  PLOF: Independent.  Pt was able to perform all of her daily activities without cervical pain.  Pt was able to turn her head without difficulty.   PATIENT GOALS: to be able to turn her neck   OBJECTIVE:   FOTO 7/11: 59% (same as IE. Goal of 69%)  10/17/22 63 % function  DIAGNOSTIC FINDINGS:  IMPRESSION: CT HEAD:   1. No  acute intracranial abnormality. 2. Mild soft tissue swelling along the right parietal scalp.   CT CERVICAL SPINE:  -Trace anterolisthesis of C7 on T1.  1. Acute fracture through the anterior superior endplate of T1. Recommend further evaluation with a cervical spine MRI to exclude the possibility of ligamentous injury. 2. Likely moderate spinal canal stenosis at C5-C6.  MR Cervical Spine Wo Contrast 1. Mild T1 wedge compression fracture with bone marrow edema underlying the superior endplate. No associated ligamentous injury. 2. Right facet edema at C7-T1, which may be a source of local neck pain. 3. Moderate C6-7 spinal canal stenosis with severe bilateral, right worse than left neural foraminal stenosis. 4. Mild spinal canal stenosis at C4-5 and C5-6 with moderate right and severe left C4-5 neural foraminal stenosis.    Active ROM A/PROM (deg) eval AROM 6/13 AROM 7/25 AROM 10/17/22  Flexion      Extension      Right lateral flexion 17 20 29  deg 26 Improves to 34 following DN  Left lateral flexion 8 15 with pain 23 deg. no pain, but felt uncomfortable 15 Improves to 33 following DN  Right rotation 34 66 72 63 improves to 72 following DN  Left rotation 25 45 45 65  mproves to 70 following DN   (Blank rows = not tested)    TODAY'S TREATMENT:   9/11  Trigger Point Dry-Needling  Treatment instructions: Expect mild to moderate muscle soreness. S/S of pneumothorax if dry needled over a lung field, and to seek immediate medical attention should they occur. Patient verbalized understanding of these instructions and education.  Patient Consent Given: Yes Education handout provided: Previously provided Muscles treated: L UT Electrical stimulation performed: No Parameters: N/A Treatment response/outcome: twitch response elicitted, decrease in tissue tension, improvement in symptoms  STM to L UT, L C/S paraspinals L C3-6 UPA, grade IV Manual traction R side glide with L SB  C3-6 grade IV  Cervical SB and ext snag 10x each   10/17/22 Reassessment Manual: STM to L UT pre and post dry needling for trigger point identification and muscular relaxation.  Trigger Point Dry-Needling  Treatment instructions: Expect mild to moderate muscle soreness. S/S of pneumothorax if dry needled over a lung field, and to seek immediate medical attention should they occur. Patient verbalized understanding of these instructions and education.  Patient Consent Given: Yes Education handout provided: Previously provided Muscles treated: L UT Electrical stimulation performed: No Parameters: N/A Treatment response/outcome: twitch response elicitted, decrease in tissue tension, improvement in symptoms Standing scap retractions with GH ER GTB 2 x 15 Standing shoulder horizontal abduction GTB 2 x 15   10/11/22 Manual: STM to L UT pre and post dry needling for trigger point identification and muscular relaxation.  Trigger Point Dry-Needling  Treatment instructions: Expect mild to moderate muscle soreness. S/S of pneumothorax if dry needled over a lung field, and to seek immediate medical attention should they occur. Patient verbalized understanding of these instructions and education.  Patient Consent Given: Yes Education handout provided: Previously provided Muscles treated: L UT Electrical stimulation performed: No Parameters: N/A Treatment response/outcome: twitch response elicitted, decrease in tissue tension, improvement in symptoms  Standing Row GTB 1 x 15 Standing shoulder extension GTB 1x 15  Standing scap retractions with GH ER GTB 1 x 15 Standing shoulder horizontal abduction GTB 1 x 15   8/22 STM and TPR to L UT, LS,  cervical ps, and SCM Instructed in SCM stretch Posterior shoulder rolls     8/15   Trigger Point Dry-Needling  Treatment instructions: Expect mild to moderate muscle soreness. S/S of pneumothorax if dry needled over a lung field, and to seek  immediate medical attention should they occur. Patient verbalized understanding of these instructions and education.   Patient Consent Given: Yes Education (verbally/handout)provided: Yes Muscles treated: L C/S paraspinals, L UT Electrical stimulation performed: N/A Treatment response/outcome: signficant LTR eliticited    STM C/S paraspinals C3-6 Bilat UPA and CPA C3-6 grade III C3-6 side glide grade III Manual traction       Previous: Therex:  Reviewed current function, pain levels, and response to prior Rx.  Pt performed: Row with GTB 2x15 Extension with GTB 2x15 Bilateral ER with RTB 2x10 Horizontal abduction with RTB x15 Wall angels x10 Doorway pec stretch 15s x3  Chin tucks with ball at wall- 3s x15   Manual Therapy: STM to cervical paraspinals bilaterally, sub occipital in supine  STM to thoracic paraspinals and scapular mm, UT, levator in seated position Cupping to L upper trap and levator   PATIENT EDUCATION:  Education details: anatomy, exercise progression, DOMS expectations, muscle firing,  envelope of function, HEP, POC POC, HEP Person educated: Patient Education method: Medical illustrator, verbal cues, and handout Education comprehension: verbalized understanding, returned demonstration, verbal cues required  HOME EXERCISE PROGRAM: Access Code: [email or web address] URL: [email or web address] Date: 07/28/2022 Prepared by: Aaron Edelman  10/11/22- Seated Upper Trapezius Stretch  - 1 x daily - 7 x weekly - 3 reps - 20 -30 second hold  ASSESSMENT:  CLINICAL IMPRESSION: Pt with good response to TPDN at today's session with near total reduction in L UT hypertonicity. Pt responded especially well to joint mobilizations with improvement of C/S rotation to 80% to the L. HEP updated to include cervical snag in varying directions. Plan to have 1 more session for D/C planning and f/u manual PRN. Patient will continue to benefit from skilled physical  therapy in order to improve function and reduce impairment.    OBJECTIVE IMPAIRMENTS: decreased activity tolerance, decreased ROM, decreased strength, hypomobility, increased fascial restrictions, increased muscle spasms, and pain.   ACTIVITY LIMITATIONS: carrying and lifting  PARTICIPATION LIMITATIONS: driving and shopping  PERSONAL FACTORS: 1-2 comorbidities: T1 compression fracture and arthritis  are also affecting patient's functional outcome.   REHAB POTENTIAL: Good  CLINICAL DECISION MAKING: Stable/uncomplicated  EVALUATION COMPLEXITY: Low   GOALS:  SHORT TERM GOALS: Target date: 08/03/2022   Pt will report at least a 25% improvement in stiffness and turning her head for improved daily mobility.  Baseline:  Goal status: GOAL MET--7/25  2.  Pt will demo at least a 6-8 deg increase in cervical Sb'ing AROM and 10 deg in cervical rotation AROM bilat for improved stiffness and daily mobility.  Baseline:  Goal status: 75% MET----07/27/22   LONG TERM GOALS: Target date: 10/19/2022  Pt will be able to turn her head while driving without significant pain and difficulty.  Baseline:  Goal status: PARTIALLY MET--7/25  2.  Pt will be independent with HEP for improved pain, ROM, and function.   Baseline:  Goal status: PROGRESSING--7/25  3.  Pt will be able to perform her ADLs and IADLs without significant pain and difficulty.  Baseline:  Goal status: GOAL MET--7/25  4.  Pt will demo at least 55 deg of cervical rotation AROM bilat for improved stiffness and daily mobility. Baseline:  Goal status: Goal MET  PLAN:  PT FREQUENCY:  1x/wk  PT DURATION: 2weeks  PLANNED INTERVENTIONS: Therapeutic exercises, Therapeutic activity, Neuromuscular re-education, Patient/Family education, Self Care, Joint mobilization, Aquatic Therapy, Dry Needling, Electrical stimulation, Cryotherapy, Moist heat, Taping, Ultrasound, Manual therapy, and Re-evaluation  PLAN FOR NEXT SESSION: Cont  with ther ex and STW per healing considerations per T1 compression fracture.  Cont with dry needling.    Zebedee Iba PT, DPT 10/25/22 2:40 PM

## 2022-11-01 ENCOUNTER — Ambulatory Visit (HOSPITAL_BASED_OUTPATIENT_CLINIC_OR_DEPARTMENT_OTHER): Payer: 59 | Admitting: Physical Therapy

## 2022-11-01 ENCOUNTER — Encounter (HOSPITAL_BASED_OUTPATIENT_CLINIC_OR_DEPARTMENT_OTHER): Payer: Self-pay | Admitting: Physical Therapy

## 2022-11-01 DIAGNOSIS — M542 Cervicalgia: Secondary | ICD-10-CM | POA: Diagnosis not present

## 2022-11-01 DIAGNOSIS — M546 Pain in thoracic spine: Secondary | ICD-10-CM

## 2022-11-01 NOTE — Therapy (Signed)
OUTPATIENT PHYSICAL THERAPY CERVICAL TREATMENT   Patient Name: Amber Klein MRN: [id number] DOB:07-May-1950, 72 y.o., female Today's Date: 11/01/2022  PHYSICAL THERAPY DISCHARGE SUMMARY  Visits from Start of Care: 15  Plan: Patient agrees to discharge.  Patient goals were not met. Patient is being discharged due to meeting the stated rehab goals.        END OF SESSION:  PT End of Session - 11/01/22 1143     Visit Number 15    Number of Visits 15    Date for PT Re-Evaluation 10/31/22    Authorization Type [employer]    PT Start Time 1105    PT Stop Time 1135    PT Time Calculation (min) 30 min    Activity Tolerance Patient tolerated treatment well    Behavior During Therapy WFL for tasks assessed/performed                      Past Medical History:  Diagnosis Date   Arthritis    Asthma    mild - allergy season induced   Cancer (HCC)    basal cell skin cancer   Complication of anesthesia    Sensitive to propofol - and BP drops   Dysrhythmia    Ear infection    Environmental and seasonal allergies    Femoral hernia of right side with obstruction 01/18/2018   GERD (gastroesophageal reflux disease)    no longer having issues takes TUMS occ.   History of kidney stones    Hypotension    Irritant contact dermatitis associated with stoma 06/25/2020   Pneumonia 02/2016   walking pneumonia    RBBB    Sinus infection    Past Surgical History:  Procedure Laterality Date   CATARACT EXTRACTION     CESAREAN SECTION  1979   1985   CO2 LASER APPLICATION N/A 05/10/2015   Procedure: CO2 LASER APPLICATION;  Surgeon: Waynard Reeds, MD;  Location: [facility];  Service: Gynecology;  Laterality: N/A;   COLON SURGERY  04/10/2020   with sepsis and colostomy   COLONOSCOPY     COLONOSCOPY WITH PROPOFOL N/A 11/11/2020   Procedure: COLONOSCOPY WITH PROPOFOL;  Surgeon: Rachael Fee, MD;  Location: [facility];  Service: Endoscopy;  Laterality: N/A;  NEEDS PREVISIT WITH  ANESTHESIA   FOOT SURGERY Right 2009   INCISIONAL HERNIA REPAIR N/A 04/06/2021   Procedure: HERNIA REPAIR INCISIONAL REPAIR WITH MESH;  Surgeon: Axel Filler, MD;  Location: [facility] OR;  Service: General;  Laterality: N/A;   INGUINAL HERNIA REPAIR Right 01/18/2018   Procedure: OPEN REPAIR INCARCERATED RIGHT FEMORAL HERNIA WITH MESH;  Surgeon: Claud Kelp, MD;  Location: [facility] OR;  Service: General;  Laterality: Right;  GENERAL / TAP BLOCK   INSERTION OF MESH N/A 04/06/2021   Procedure: INSERTION OF MESH;  Surgeon: Axel Filler, MD;  Location: [facility] OR;  Service: General;  Laterality: N/A;   KNEE SURGERY Left 2016   LAPAROTOMY N/A 04/06/2021   Procedure: EXPLORATORY LAPAROTOMY;  Surgeon: Axel Filler, MD;  Location: [facility] OR;  Service: General;  Laterality: N/A;   LYSIS OF ADHESION N/A 04/06/2021   Procedure: LYSIS OF ADHESION;  Surgeon: Axel Filler, MD;  Location: [facility] OR;  Service: General;  Laterality: N/A;   ORIF ELBOW FRACTURE Left 07/04/2014   Procedure: OPEN REDUCTION INTERNAL FIXATION (ORIF) ELBOW/OLECRANON FRACTURE;  Surgeon: Cammy Copa, MD;  Location: [facility];  Service: Orthopedics;  Laterality: Left;   STOMA DILATATION N/A 12/08/2020   Procedure:  STOMA DILATATION WITH STOMAPLASTY;  Surgeon: Luretha Murphy, MD;  Location: [facility];  Service: General;  Laterality: N/A;   VULVECTOMY N/A 08/10/2016   Procedure: WIDE EXCISION VULVECTOMY;  Surgeon: Waynard Reeds, MD;  Location: [facility];  Service: Gynecology;  Laterality: N/A;   XI ROBOTIC ASSISTED COLOSTOMY TAKEDOWN N/A 01/05/2021   Procedure: ROBOTIC COLOSTOMY REVERSAL WITH LAPAROSCOPIC LYSIS OF ADHESIONS;  Surgeon: Romie Levee, MD;  Location: [facility];  Service: General;  Laterality: N/A;   Patient Active Problem List   Diagnosis Date Noted   S/P hernia repair 04/06/2021   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   Colonic obstruction (HCC) 12/08/2020   Diverticulosis of colon 10/08/2020   Intractable vomiting 07/06/2020   Hypoxia  07/06/2020   Irritant contact dermatitis associated with stoma 06/25/2020   Diverticulitis 05/21/2020   GERD (gastroesophageal reflux disease)    Acute suppurative peritonitis (HCC) 04/11/2020   Perforated sigmoid colon (HCC) 04/10/2020   Other constipation 04/06/2020   Femoral hernia of right side with obstruction 01/18/2018   Elbow fracture 07/04/2014     REFERRING PROVIDER: Julio Sicks, MD  REFERRING DIAG: [phone] (ICD-10-CM) - Spinal stenosis, cervical region  THERAPY DIAG:  Cervicalgia  Pain in thoracic spine  Stiffness of unspecified joint, not elsewhere classified  Rationale for Evaluation and Treatment: Rehabilitation  ONSET DATE: 06/02/2022  SUBJECTIVE:                                                                                                                                                                                                         SUBJECTIVE STATEMENT: Pt states the rotation to the L was maintained after last session. End range L rotation is still very lightly uncomfortable/painful. Pt feels she has returned to near normal and is ready to start slow gradual exercise in the gym   Hand dominance: Right  PERTINENT HISTORY:  T1 Compression Fracture on 06/02/22 Per dx and CT/MRI findings  PMHx:  Arthritis, osteopenia, R BBB, R foot surgery in 2009, L knee surgery 2016, ORIF L elbow fx in 2016  PAIN:  NPRS:  0/10 current at rest.  2-3/10 with R rotation, 3-4-3/10 with L rotation.  Worst:  4/10 Location:  L sided cervical, L proximal/superior scapula  PRECAUTIONS: Other: T1 compression fracture  WEIGHT BEARING RESTRICTIONS: none indicated on MD order  FALLS:  Has patient fallen in last 6 months? Yes. 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  PLOF: Independent.  Pt was able to perform all  of her daily activities without cervical pain.  Pt was able to turn her head  without difficulty.   PATIENT GOALS: to be able to turn her neck   OBJECTIVE:   FOTO 7/11: 59% (same as IE. Goal of 69%) 10/17/22 63 % function  DIAGNOSTIC FINDINGS:  IMPRESSION: CT HEAD:   1. No acute intracranial abnormality. 2. Mild soft tissue swelling along the right parietal scalp.   CT CERVICAL SPINE:  -Trace anterolisthesis of C7 on T1.  1. Acute fracture through the anterior superior endplate of T1. Recommend further evaluation with a cervical spine MRI to exclude the possibility of ligamentous injury. 2. Likely moderate spinal canal stenosis at C5-C6.  MR Cervical Spine Wo Contrast 1. Mild T1 wedge compression fracture with bone marrow edema underlying the superior endplate. No associated ligamentous injury. 2. Right facet edema at C7-T1, which may be a source of local neck pain. 3. Moderate C6-7 spinal canal stenosis with severe bilateral, right worse than left neural foraminal stenosis. 4. Mild spinal canal stenosis at C4-5 and C5-6 with moderate right and severe left C4-5 neural foraminal stenosis.    Active ROM A/PROM (deg) eval AROM 6/13 AROM 7/25 AROM 10/17/22 9/18  Flexion       Extension       Right lateral flexion 17 20 29  deg 26 Improves to 34 following DN 40  Left lateral flexion 8 15 with pain 23 deg. no pain, but felt uncomfortable 15 Improves to 33 following DN 40  Right rotation 34 66 72 63 improves to 72 following DN 85  Left rotation 25 45 45 65  mproves to 70 following DN 75   (Blank rows = not tested)    TODAY'S TREATMENT:   9/11  Trigger Point Dry-Needling  Treatment instructions: Expect mild to moderate muscle soreness. S/S of pneumothorax if dry needled over a lung field, and to seek immediate medical attention should they occur. Patient verbalized understanding of these instructions and education.  Patient Consent Given: Yes Education handout provided: Previously provided Muscles treated: L C/S paraspinals Electrical  stimulation performed: No Parameters: N/A Treatment response/outcome: twitch response elicitted, decrease in tissue tension, improvement in symptoms  STM to L UT, L C/S paraspinals L C3-6 UPA, grade IV R side glide with L SB C3-6 grade IV   9/11  Trigger Point Dry-Needling  Treatment instructions: Expect mild to moderate muscle soreness. S/S of pneumothorax if dry needled over a lung field, and to seek immediate medical attention should they occur. Patient verbalized understanding of these instructions and education.  Patient Consent Given: Yes Education handout provided: Previously provided Muscles treated: L UT Electrical stimulation performed: No Parameters: N/A Treatment response/outcome: twitch response elicitted, decrease in tissue tension, improvement in symptoms  STM to L UT, L C/S paraspinals L C3-6 UPA, grade IV Manual traction R side glide with L SB C3-6 grade IV  Cervical SB and ext snag 10x each   10/17/22 Reassessment Manual: STM to L UT pre and post dry needling for trigger point identification and muscular relaxation.  Trigger Point Dry-Needling  Treatment instructions: Expect mild to moderate muscle soreness. S/S of pneumothorax if dry needled over a lung field, and to seek immediate medical attention should they occur. Patient verbalized understanding of these instructions and education.  Patient Consent Given: Yes Education handout provided: Previously provided Muscles treated: L UT Electrical stimulation performed: No Parameters: N/A Treatment response/outcome: twitch response elicitted, decrease in tissue tension, improvement in symptoms Standing scap retractions with GH  ER GTB 2 x 15 Standing shoulder horizontal abduction GTB 2 x 15   10/11/22 Manual: STM to L UT pre and post dry needling for trigger point identification and muscular relaxation.  Trigger Point Dry-Needling  Treatment instructions: Expect mild to moderate muscle soreness. S/S of  pneumothorax if dry needled over a lung field, and to seek immediate medical attention should they occur. Patient verbalized understanding of these instructions and education.  Patient Consent Given: Yes Education handout provided: Previously provided Muscles treated: L UT Electrical stimulation performed: No Parameters: N/A Treatment response/outcome: twitch response elicitted, decrease in tissue tension, improvement in symptoms  Standing Row GTB 1 x 15 Standing shoulder extension GTB 1x 15  Standing scap retractions with GH ER GTB 1 x 15 Standing shoulder horizontal abduction GTB 1 x 15   8/22 STM and TPR to L UT, LS, cervical ps, and SCM Instructed in SCM stretch Posterior shoulder rolls     8/15   Trigger Point Dry-Needling  Treatment instructions: Expect mild to moderate muscle soreness. S/S of pneumothorax if dry needled over a lung field, and to seek immediate medical attention should they occur. Patient verbalized understanding of these instructions and education.   Patient Consent Given: Yes Education (verbally/handout)provided: Yes Muscles treated: L C/S paraspinals, L UT Electrical stimulation performed: N/A Treatment response/outcome: signficant LTR eliticited    STM C/S paraspinals C3-6 Bilat UPA and CPA C3-6 grade III C3-6 side glide grade III Manual traction       Previous: Therex:  Reviewed current function, pain levels, and response to prior Rx.  Pt performed: Row with GTB 2x15 Extension with GTB 2x15 Bilateral ER with RTB 2x10 Horizontal abduction with RTB x15 Wall angels x10 Doorway pec stretch 15s x3  Chin tucks with ball at wall- 3s x15   Manual Therapy: STM to cervical paraspinals bilaterally, sub occipital in supine  STM to thoracic paraspinals and scapular mm, UT, levator in seated position Cupping to L upper trap and levator   PATIENT EDUCATION:  Education details: anatomy, exercise progression, DOMS expectations, muscle firing,   envelope of function, HEP, POC POC, HEP Person educated: Patient Education method: Medical illustrator, verbal cues, and handout Education comprehension: verbalized understanding, returned demonstration, verbal cues required  HOME EXERCISE PROGRAM: Access Code: [email or web address] URL: [email or web address] Date: 07/28/2022 Prepared by: Aaron Edelman  10/11/22- Seated Upper Trapezius Stretch  - 1 x daily - 7 x weekly - 3 reps - 20 -30 second hold  ASSESSMENT:  CLINICAL IMPRESSION: Pt with final visit today as pt has met all PT goals, improved ROM significantly, and is independent with self pain management and HEP. End range pain still present but likely able to improve with massage therapy that pt has planned. Pt given edu about exercise taper and self pain management. No further needs for skilled therapy. D/C this episode of care.   OBJECTIVE IMPAIRMENTS: decreased activity tolerance, decreased ROM, decreased strength, hypomobility, increased fascial restrictions, increased muscle spasms, and pain.   ACTIVITY LIMITATIONS: carrying and lifting  PARTICIPATION LIMITATIONS: driving and shopping  PERSONAL FACTORS: 1-2 comorbidities: T1 compression fracture and arthritis  are also affecting patient's functional outcome.   REHAB POTENTIAL: Good  CLINICAL DECISION MAKING: Stable/uncomplicated  EVALUATION COMPLEXITY: Low   GOALS:  SHORT TERM GOALS: Target date: 08/03/2022   Pt will report at least a 25% improvement in stiffness and turning her head for improved daily mobility.  Baseline:  Goal status: GOAL MET--7/25  2.  Pt will demo at least a  6-8 deg increase in cervical Sb'ing AROM and 10 deg in cervical rotation AROM bilat for improved stiffness and daily mobility.  Baseline:  Goal status: 75% MET----07/27/22   LONG TERM GOALS: Target date: 10/19/2022  Pt will be able to turn her head while driving without significant pain and difficulty.  Baseline:  Goal  status: MET-  2.  Pt will be independent with HEP for improved pain, ROM, and function.   Baseline:  Goal status: MET-  3.  Pt will be able to perform her ADLs and IADLs without significant pain and difficulty.  Baseline:  Goal status: GOAL MET-  4.  Pt will demo at least 55 deg of cervical rotation AROM bilat for improved stiffness and daily mobility. Baseline:  Goal status: Goal MET     PLAN:  PT FREQUENCY:  1x/wk  PT DURATION: 2weeks  PLANNED INTERVENTIONS: Therapeutic exercises, Therapeutic activity, Neuromuscular re-education, Patient/Family education, Self Care, Joint mobilization, Aquatic Therapy, Dry Needling, Electrical stimulation, Cryotherapy, Moist heat, Taping, Ultrasound, Manual therapy, and Re-evaluation     Zebedee Iba PT, DPT 11/01/22 11:46 AM

## 2023-03-26 ENCOUNTER — Ambulatory Visit (INDEPENDENT_AMBULATORY_CARE_PROVIDER_SITE_OTHER): Payer: 59 | Admitting: Internal Medicine

## 2023-03-26 ENCOUNTER — Encounter: Payer: Self-pay | Admitting: Internal Medicine

## 2023-03-26 VITALS — BP 110/80 | HR 90 | Ht 63.5 in | Wt 159.3 lb

## 2023-03-26 DIAGNOSIS — K5730 Diverticulosis of large intestine without perforation or abscess without bleeding: Secondary | ICD-10-CM

## 2023-03-26 DIAGNOSIS — Z0000 Encounter for general adult medical examination without abnormal findings: Secondary | ICD-10-CM | POA: Diagnosis not present

## 2023-03-26 DIAGNOSIS — R7302 Impaired glucose tolerance (oral): Secondary | ICD-10-CM

## 2023-03-26 LAB — CBC WITH DIFFERENTIAL/PLATELET
Basophils Absolute: 0.1 10*3/uL (ref 0.0–0.1)
Basophils Relative: 1 % (ref 0.0–3.0)
Eosinophils Absolute: 0.3 10*3/uL (ref 0.0–0.7)
Eosinophils Relative: 4.7 % (ref 0.0–5.0)
HCT: 43.2 % (ref 36.0–46.0)
Hemoglobin: 14.3 g/dL (ref 12.0–15.0)
Lymphocytes Relative: 27.9 % (ref 12.0–46.0)
Lymphs Abs: 1.5 10*3/uL (ref 0.7–4.0)
MCHC: 33.1 g/dL (ref 30.0–36.0)
MCV: 92 fL (ref 78.0–100.0)
Monocytes Absolute: 0.6 10*3/uL (ref 0.1–1.0)
Monocytes Relative: 10.2 % (ref 3.0–12.0)
Neutro Abs: 3 10*3/uL (ref 1.4–7.7)
Neutrophils Relative %: 56.2 % (ref 43.0–77.0)
Platelets: 359 10*3/uL (ref 150.0–400.0)
RBC: 4.69 Mil/uL (ref 3.87–5.11)
RDW: 13.8 % (ref 11.5–15.5)
WBC: 5.4 10*3/uL (ref 4.0–10.5)

## 2023-03-26 LAB — TSH: TSH: 1.8 u[IU]/mL (ref 0.35–5.50)

## 2023-03-26 LAB — COMPREHENSIVE METABOLIC PANEL
ALT: 17 U/L (ref 0–35)
AST: 21 U/L (ref 0–37)
Albumin: 4.1 g/dL (ref 3.5–5.2)
Alkaline Phosphatase: 85 U/L (ref 39–117)
BUN: 7 mg/dL (ref 6–23)
CO2: 29 meq/L (ref 19–32)
Calcium: 9.5 mg/dL (ref 8.4–10.5)
Chloride: 99 meq/L (ref 96–112)
Creatinine, Ser: 0.73 mg/dL (ref 0.40–1.20)
GFR: 83.21 mL/min (ref >60.00)
Glucose, Bld: 103 mg/dL — ABNORMAL HIGH (ref 70–99)
Potassium: 4.7 meq/L (ref 3.5–5.1)
Sodium: 136 meq/L (ref 135–145)
Total Bilirubin: 0.4 mg/dL (ref 0.2–1.2)
Total Protein: 6.9 g/dL (ref 6.0–8.3)

## 2023-03-26 LAB — LIPID PANEL
Cholesterol: 223 mg/dL — ABNORMAL HIGH (ref 0–200)
HDL: 88 mg/dL (ref >39.00)
LDL Cholesterol: 125 mg/dL — ABNORMAL HIGH (ref 0–99)
NonHDL: 135.45
Total CHOL/HDL Ratio: 3
Triglycerides: 50 mg/dL (ref 0.0–149.0)
VLDL: 10 mg/dL (ref 0.0–40.0)

## 2023-03-26 LAB — VITAMIN D 25 HYDROXY (VIT D DEFICIENCY, FRACTURES): VITD: 46.85 ng/mL (ref 30.00–100.00)

## 2023-03-26 LAB — VITAMIN B12: Vitamin B-12: 305 pg/mL (ref 211–911)

## 2023-03-26 LAB — HEMOGLOBIN A1C: Hgb A1c MFr Bld: 5.8 % (ref 4.6–6.5)

## 2023-03-26 NOTE — Progress Notes (Signed)
 Established Patient Office Visit     CC/Reason for Visit: Annual preventive exam  HPI: Amber Klein is a 73 y.o. female who is coming in today for the above mentioned reasons. Past Medical History is significant for: Hyperlipidemia not on medication, impaired glucose tolerance, history of complicated diverticulitis.  She was doing well and has no acute concerns or complaints.  Last year she fractured her T1 vertebrae after a fall and has now been released by neurosurgery.   Past Medical/Surgical History: Past Medical History:  Diagnosis Date   Arthritis    Asthma    mild - allergy season induced   Cancer (HCC)    basal cell skin cancer   Complication of anesthesia    Sensitive to propofol  - and BP drops   Dysrhythmia    Ear infection    Environmental and seasonal allergies    Femoral hernia of right side with obstruction 01/18/2018   GERD (gastroesophageal reflux disease)    no longer having issues takes TUMS occ.   History of kidney stones    Hypotension    Irritant contact dermatitis associated with stoma 06/25/2020   Pneumonia 02/2016   walking pneumonia    RBBB    Sinus infection     Past Surgical History:  Procedure Laterality Date   CATARACT EXTRACTION     CESAREAN SECTION  1979   1985   CO2 LASER APPLICATION N/A 05/10/2015   Procedure: CO2 LASER APPLICATION;  Surgeon: Reggy Capers, MD;  Location: [facility];  Service: Gynecology;  Laterality: N/A;   COLON SURGERY  04/10/2020   with sepsis and colostomy   COLONOSCOPY     COLONOSCOPY WITH PROPOFOL  N/A 11/11/2020   Procedure: COLONOSCOPY WITH PROPOFOL ;  Surgeon: Janel Medford, MD;  [facility];  Service: Endoscopy;  Laterality: N/A;  NEEDS PREVISIT WITH ANESTHESIA   FOOT SURGERY Right 2009   INCISIONAL HERNIA REPAIR N/A 04/06/2021   Procedure: HERNIA REPAIR INCISIONAL REPAIR WITH MESH;  Surgeon: Shela Derby, MD;  Location: [facility] OR;  Service: General;  Laterality: N/A;   INGUINAL  HERNIA REPAIR Right 01/18/2018   Procedure: OPEN REPAIR INCARCERATED RIGHT FEMORAL HERNIA WITH MESH;  Surgeon: Boyce Byes, MD;  Location: [facility] OR;  Service: General;  Laterality: Right;  GENERAL / TAP BLOCK   INSERTION OF MESH N/A 04/06/2021   Procedure: INSERTION OF MESH;  Surgeon: Shela Derby, MD;  Location: [facility] OR;  Service: General;  Laterality: N/A;   KNEE SURGERY Left 2016   LAPAROTOMY N/A 04/06/2021   Procedure: EXPLORATORY LAPAROTOMY;  Surgeon: Shela Derby, MD;  Location: [facility] OR;  Service: General;  Laterality: N/A;   LYSIS OF ADHESION N/A 04/06/2021   Procedure: LYSIS OF ADHESION;  Surgeon: Shela Derby, MD;  Location: [facility] OR;  Service: General;  Laterality: N/A;   ORIF ELBOW FRACTURE Left 07/04/2014   Procedure: OPEN REDUCTION INTERNAL FIXATION (ORIF) ELBOW/OLECRANON FRACTURE;  Surgeon: Jasmine Mesi, MD;  Location: [facility];  Service: Orthopedics;  Laterality: Left;   STOMA DILATATION N/A 12/08/2020   Procedure: STOMA DILATATION WITH STOMAPLASTY;  Surgeon: Jacolyn Matar, MD;  Location: [facility];  Service: General;  Laterality: N/A;   VULVECTOMY N/A 08/10/2016   Procedure: WIDE EXCISION VULVECTOMY;  Surgeon: Reggy Capers, MD;  Location: [facility];  Service: Gynecology;  Laterality: N/A;   XI ROBOTIC ASSISTED COLOSTOMY TAKEDOWN N/A 01/05/2021   Procedure: ROBOTIC COLOSTOMY REVERSAL WITH LAPAROSCOPIC LYSIS OF ADHESIONS;  Surgeon: Joyce Nixon, MD;  Location: [facility];  Service: General;  Laterality: N/A;    Social History:  reports that she quit smoking about 8 years ago. Her smoking use included cigarettes. She started smoking about 48 years ago. She has a 4 pack-year smoking history. She has never used smokeless tobacco. She reports current alcohol use. She reports that she does not use drugs.  Allergies: Allergies  Allergen Reactions   Iodinated Contrast Media Anaphylaxis   Shellfish Allergy Anaphylaxis   Vancomycin Anaphylaxis   Wasp Venom Anaphylaxis    Ciprofloxacin Hives and Swelling    Hives   Sulfa Antibiotics Swelling and Other (See Comments)    Migraine   Zithromax [Azithromycin] Hives   Latex Rash    Sensitivity on too long   Propofol      Couldn't wake patient up - needs lower doses     Family History:  Family History  Problem Relation Age of Onset   Dementia Mother    Kidney cancer Father    Breast cancer Maternal Grandmother    Colon cancer Neg Hx    Esophageal cancer Neg Hx    Pancreatic cancer Neg Hx    Stomach cancer Neg Hx      Current Outpatient Medications:    albuterol  (VENTOLIN  HFA) 108 (90 Base) MCG/ACT inhaler, Inhale 2 puffs into the lungs every 6 (six) hours as needed for wheezing or shortness of breath., Disp: 8.5 g, Rfl: 2   EPINEPHrine  0.3 MG/0.3ML SOSY, Inject 0.3 mLs (0.3 mg total) into the muscle as needed for anaphylaxis, Disp: 1 each, Rfl: 2   estradiol  (CLIMARA  - DOSED IN MG/24 HR) 0.05 mg/24hr patch, 0.05 mg once a week., Disp: , Rfl:    fexofenadine-pseudoephedrine (ALLEGRA-D) 60-120 MG 12 hr tablet, Take 1 tablet by mouth daily., Disp: , Rfl:    latanoprost  (XALATAN ) 0.005 % ophthalmic solution, Place 1 drop into both eyes at bedtime., Disp: , Rfl:    Multiple Vitamin (MULTIVITAMIN WITH MINERALS) TABS tablet, Take 1 tablet by mouth daily., Disp: , Rfl:    progesterone  (PROMETRIUM ) 100 MG capsule, Take 100 mg by mouth daily., Disp: , Rfl:   Review of Systems:  Negative unless indicated in HPI.   Physical Exam: Vitals:   03/26/23 0954  BP: 110/80  Pulse: 90  SpO2: 96%  Weight: 159 lb 4.8 oz (72.3 kg)  Height: 5' 3.5" (1.613 m)    Body mass index is 27.78 kg/m.   Physical Exam Vitals reviewed.  Constitutional:      General: She is not in acute distress.    Appearance: Normal appearance. She is not ill-appearing, toxic-appearing or diaphoretic.  HENT:     Head: Normocephalic.     Right Ear: Tympanic membrane, ear canal and external ear normal. There is no impacted cerumen.      Left Ear: Tympanic membrane, ear canal and external ear normal. There is no impacted cerumen.     Nose: Nose normal.     Mouth/Throat:     Mouth: Mucous membranes are moist.     Pharynx: Oropharynx is clear. No oropharyngeal exudate or posterior oropharyngeal erythema.  Eyes:     General: No scleral icterus.       Right eye: No discharge.        Left eye: No discharge.     Conjunctiva/sclera: Conjunctivae normal.     Pupils: Pupils are equal, round, and reactive to light.  Neck:     Vascular: No carotid bruit.  Cardiovascular:     Rate and Rhythm: Normal rate  and regular rhythm.     Pulses: Normal pulses.     Heart sounds: Normal heart sounds.  Pulmonary:     Effort: Pulmonary effort is normal. No respiratory distress.     Breath sounds: Normal breath sounds.  Abdominal:     General: Abdomen is flat. Bowel sounds are normal.     Palpations: Abdomen is soft.  Musculoskeletal:        General: Normal range of motion.     Cervical back: Normal range of motion.  Skin:    General: Skin is warm and dry.  Neurological:     General: No focal deficit present.     Mental Status: She is alert and oriented to person, place, and time. Mental status is at baseline.  Psychiatric:        Mood and Affect: Mood normal.        Behavior: Behavior normal.        Thought Content: Thought content normal.        Judgment: Judgment normal.      Impression and Plan:  Encounter for preventive health examination -     CBC with Differential/Platelet; Future -     Comprehensive metabolic panel; Future -     Lipid panel; Future -     TSH; Future -     Vitamin B12; Future -     VITAMIN D  25 Hydroxy (Vit-D Deficiency, Fractures); Future  IGT (impaired glucose tolerance) -     Hemoglobin A1c; Future  Diverticulosis of colon  -Recommend routine eye and dental care. -Healthy lifestyle discussed in detail. -Labs to be updated today. -Prostate cancer screening: N/A Health Maintenance  Topic Date  Due   Pneumonia Vaccine (1 of 1 - PCV) 08/14/2017   Mammogram  09/16/2020   COVID-19 Vaccine (6 - 2024-25 season) 10/15/2022   DTaP/Tdap/Td vaccine (2 - Td or Tdap) 05/21/2029   Colon Cancer Screening  11/12/2030   Flu Shot  Completed   DEXA scan (bone density measurement)  Completed   Hepatitis C Screening  Completed   Zoster (Shingles) Vaccine  Completed   HPV Vaccine  Aged Out      -She will obtain COVID and RSV vaccines this weekend at pharmacy. -Obtain records from GYN in regards to breast and cervical cancer screening. -Colonoscopy is up-to-date.    Marguerita Shih, MD  Primary Care at [facility]

## 2023-03-30 ENCOUNTER — Encounter: Payer: Self-pay | Admitting: Internal Medicine

## 2023-04-10 ENCOUNTER — Encounter: Payer: Self-pay | Admitting: Internal Medicine

## 2023-04-11 ENCOUNTER — Other Ambulatory Visit: Payer: Self-pay | Admitting: Internal Medicine

## 2023-04-11 ENCOUNTER — Encounter: Payer: Self-pay | Admitting: Internal Medicine

## 2023-04-11 DIAGNOSIS — B9689 Other specified bacterial agents as the cause of diseases classified elsewhere: Secondary | ICD-10-CM

## 2023-04-18 ENCOUNTER — Encounter: Payer: Self-pay | Admitting: Internal Medicine

## 2023-04-18 DIAGNOSIS — E785 Hyperlipidemia, unspecified: Secondary | ICD-10-CM | POA: Insufficient documentation

## 2023-04-29 ENCOUNTER — Telehealth: Admitting: Physician Assistant

## 2023-04-29 ENCOUNTER — Encounter: Payer: Self-pay | Admitting: Physician Assistant

## 2023-04-29 DIAGNOSIS — B9789 Other viral agents as the cause of diseases classified elsewhere: Secondary | ICD-10-CM | POA: Diagnosis not present

## 2023-04-29 DIAGNOSIS — J019 Acute sinusitis, unspecified: Secondary | ICD-10-CM | POA: Diagnosis not present

## 2023-04-29 MED ORDER — AMOXICILLIN-POT CLAVULANATE 875-125 MG PO TABS: 1.0000 | ORAL_TABLET | Freq: Two times a day (BID) | ORAL | 0 refills | Status: AC

## 2023-04-29 MED ORDER — AZELASTINE HCL 0.1 % NA SOLN: 2.0000 | Freq: Two times a day (BID) | NASAL | 12 refills | Status: AC

## 2023-04-29 NOTE — Progress Notes (Signed)

## 2023-04-29 NOTE — Progress Notes (Signed)
 Virtual Visit Consent   Amber Klein, you are scheduled for a virtual visit with a [facility] provider today. Just as with appointments in the office, your consent must be obtained to participate. Your consent will be active for this visit and any virtual visit you may have with one of our providers in the next 365 days. If you have a MyChart account, a copy of this consent can be sent to you electronically.  As this is a virtual visit, video technology does not allow for your provider to perform a traditional examination. This may limit your provider's ability to fully assess your condition. If your provider identifies any concerns that need to be evaluated in person or the need to arrange testing (such as labs, EKG, etc.), we will make arrangements to do so. Although advances in technology are sophisticated, we cannot ensure that it will always work on either your end or our end. If the connection with a video visit is poor, the visit may have to be switched to a telephone visit. With either a video or telephone visit, we are not always able to ensure that we have a secure connection.  By engaging in this virtual visit, you consent to the provision of healthcare and authorize for your insurance to be billed (if applicable) for the services provided during this visit. Depending on your insurance coverage, you may receive a charge related to this service.  I need to obtain your verbal consent now. Are you willing to proceed with your visit today? Amber Klein has provided verbal consent on 04/29/2023 for a virtual visit (video or telephone). Laure Kidney, [place]  Date: 04/29/2023 3:21 PM   Virtual Visit via Video Note   I, Laure Kidney, connected with  Amber Klein  ([id number], 21-Dec-1950) on 04/29/23 at  3:15 PM EDT by a video-enabled telemedicine application and verified that I am speaking with the correct person using two identifiers.  Location: Patient: Virtual  Visit Location Patient: Home Provider: Virtual Visit Location Provider: Home Office   I discussed the limitations of evaluation and management by telemedicine and the availability of in person appointments. The patient expressed understanding and agreed to proceed.    History of Present Illness: Amber Klein is a 73 y.o. who identifies as a female who was assigned female at birth, and is being seen today for sinus issues  HPI: Sinus Problem This is a recurrent problem. The current episode started in the past 7 days. The problem has been gradually worsening since onset. There has been no fever. The pain is mild. Associated symptoms include congestion and ear pain. Past treatments include acetaminophen, saline nose sprays and oral decongestants. The treatment provided mild relief.    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   Colonic obstruction (HCC) 12/08/2020   Diverticulosis of colon 10/08/2020   Intractable vomiting 07/06/2020   Hypoxia 07/06/2020   Irritant contact dermatitis associated with stoma 06/25/2020   Diverticulitis 05/21/2020   GERD (gastroesophageal reflux disease)    Acute suppurative peritonitis (HCC) 04/11/2020   Perforated sigmoid colon (HCC) 04/10/2020   Other constipation 04/06/2020   Femoral hernia of right side with obstruction 01/18/2018   Elbow fracture 07/04/2014    Allergies:  Allergies  Allergen Reactions   Iodinated Contrast Media Anaphylaxis   Shellfish Allergy Anaphylaxis  Vancomycin Anaphylaxis   Wasp Venom Anaphylaxis   Ciprofloxacin Hives and Swelling    Hives   Sulfa Antibiotics Swelling and Other (See Comments)    Migraine   Zithromax [Azithromycin] Hives   Latex Rash    Sensitivity on too long   Propofol     Couldn't wake patient up - needs lower doses     Medications:  Current Outpatient Medications:    amoxicillin-clavulanate (AUGMENTIN) 875-125 MG tablet, Take 1 tablet by mouth 2 (two) times daily for 7 days., Disp: 14 tablet, Rfl: 0   azelastine (ASTELIN) 0.1 % nasal spray, Place 2 sprays into both nostrils 2 (two) times daily. Use in each nostril as directed, Disp: 30 mL, Rfl: 12   albuterol (VENTOLIN HFA) 108 (90 Base) MCG/ACT inhaler, INHALE 2 PUFFS INTO THE LUNGS EVERY 6 HOURS AS NEEDED FOR WHEEZING OR SHORTNESS OF BREATH, Disp: 8.5 g, Rfl: 2   EPINEPHrine 0.3 MG/0.3ML SOSY, Inject 0.3 mLs (0.3 mg total) into the muscle as needed for anaphylaxis, Disp: 1 each, Rfl: 2   estradiol (CLIMARA - DOSED IN MG/24 HR) 0.05 mg/24hr patch, 0.05 mg once a week., Disp: , Rfl:    fexofenadine-pseudoephedrine (ALLEGRA-D) 60-120 MG 12 hr tablet, Take 1 tablet by mouth daily., Disp: , Rfl:    latanoprost (XALATAN) 0.005 % ophthalmic solution, Place 1 drop into both eyes at bedtime., Disp: , Rfl:    Multiple Vitamin (MULTIVITAMIN WITH MINERALS) TABS tablet, Take 1 tablet by mouth daily., Disp: , Rfl:    progesterone (PROMETRIUM) 100 MG capsule, Take 100 mg by mouth daily., Disp: , Rfl:   Observations/Objective: Patient is well-developed, well-nourished in no acute distress.  Resting comfortably  at home.  Head is normocephalic, atraumatic.  No labored breathing.  Speech is clear and coherent with logical content.  Patient is alert and oriented at baseline.    Assessment and Plan: 1. Acute viral sinusitis (Primary)  Presentation consistent with bacterial sinusitis.  No evidence of other bacterial infections including pneumonia, meningitis, pharyngitis, otitis media.  Discussed that this fits picture of viral vs bacterial sinusitis and that due to type and duration of symptoms and exam findings, we will treat as bacterial sinusitis.  Antibiotics prescribed. Advised to continue ibuprofen and Tylenol at home. Patient is to follow up with primary physician  if having continued symptoms.   Advised patient on supportive therapies, including using a cool-mist vaporizer/humidifier/steam from hot showers, OTC throat lozenges, advancement of fluids as tolerated, nasal saline sprays, rest, OTC acetaminophen or ibuprofen for pain control, frequent handwashing.  Follow Up Instructions: I discussed the assessment and treatment plan with the patient. The patient was provided an opportunity to ask questions and all were answered. The patient agreed with the plan and demonstrated an understanding of the instructions.  A copy of instructions were sent to the patient via MyChart unless otherwise noted below.    The patient was advised to call back or seek an in-person evaluation if the symptoms worsen or if the condition fails to improve as anticipated.    Laure Kidney, PA-C

## 2023-04-29 NOTE — Patient Instructions (Signed)
 Amber Klein, thank you for joining Laure Kidney, PA-C for today's virtual visit.  While this provider is not your primary care provider (PCP), if your PCP is located in our provider database this encounter information will be shared with them immediately following your visit.   A [facility] MyChart account gives you access to today's visit and all your visits, tests, and labs performed at [facility] " click here if you don't have a [facility] MyChart account or go to [email or web address]  Consent: (Patient) Amber Klein provided verbal consent for this virtual visit at the beginning of the encounter.  Current Medications:  Current Outpatient Medications:    amoxicillin-clavulanate (AUGMENTIN) 875-125 MG tablet, Take 1 tablet by mouth 2 (two) times daily for 7 days., Disp: 14 tablet, Rfl: 0   azelastine (ASTELIN) 0.1 % nasal spray, Place 2 sprays into both nostrils 2 (two) times daily. Use in each nostril as directed, Disp: 30 mL, Rfl: 12   albuterol (VENTOLIN HFA) 108 (90 Base) MCG/ACT inhaler, INHALE 2 PUFFS INTO THE LUNGS EVERY 6 HOURS AS NEEDED FOR WHEEZING OR SHORTNESS OF BREATH, Disp: 8.5 g, Rfl: 2   EPINEPHrine 0.3 MG/0.3ML SOSY, Inject 0.3 mLs (0.3 mg total) into the muscle as needed for anaphylaxis, Disp: 1 each, Rfl: 2   estradiol (CLIMARA - DOSED IN MG/24 HR) 0.05 mg/24hr patch, 0.05 mg once a week., Disp: , Rfl:    fexofenadine-pseudoephedrine (ALLEGRA-D) 60-120 MG 12 hr tablet, Take 1 tablet by mouth daily., Disp: , Rfl:    latanoprost (XALATAN) 0.005 % ophthalmic solution, Place 1 drop into both eyes at bedtime., Disp: , Rfl:    Multiple Vitamin (MULTIVITAMIN WITH MINERALS) TABS tablet, Take 1 tablet by mouth daily., Disp: , Rfl:    progesterone (PROMETRIUM) 100 MG capsule, Take 100 mg by mouth daily., Disp: , Rfl:    Medications ordered in this encounter:  Meds ordered this encounter  Medications   amoxicillin-clavulanate (AUGMENTIN)  875-125 MG tablet    Sig: Take 1 tablet by mouth 2 (two) times daily for 7 days.    Dispense:  14 tablet    Refill:  0    Supervising Provider:   Merrilee Jansky [[id number]]   azelastine (ASTELIN) 0.1 % nasal spray    Sig: Place 2 sprays into both nostrils 2 (two) times daily. Use in each nostril as directed    Dispense:  30 mL    Refill:  12    Supervising Provider:   Merrilee Jansky [[id number]]     *If you need refills on other medications prior to your next appointment, please contact your pharmacy*  Follow-Up: Call back or seek an in-person evaluation if the symptoms worsen or if the condition fails to improve as anticipated.  [facility] [phone]  Other Instructions Please report to the nearest Emergency room with any worsening symptoms. Follow up with primary care provider (PCP) in 2 -3 days.    If you have been instructed to have an in-person evaluation today at a local Urgent Care facility, please use the link below. It will take you to a list of all of our available [facility], including address, phone number and hours of operation. Please do not delay care.  [facility]  If you or a family member do not have a primary care provider, use the link below to schedule a visit and establish care. When you choose a  primary care physician or  advanced practice provider, you gain a long-term partner in health. Find a Primary Care Provider  Learn more about [facility] in-office and virtual care options: [facility] - Get Care Now

## 2023-05-14 ENCOUNTER — Encounter: Payer: Self-pay | Admitting: Internal Medicine

## 2023-05-14 ENCOUNTER — Ambulatory Visit: Admitting: Internal Medicine

## 2023-05-14 VITALS — BP 120/78 | HR 91 | Temp 98.3°F | Wt 161.8 lb

## 2023-05-14 DIAGNOSIS — J302 Other seasonal allergic rhinitis: Secondary | ICD-10-CM

## 2023-05-14 NOTE — Progress Notes (Signed)
 Established Patient Office Visit     CC/Reason for Visit: Seasonal allergies  HPI: Amber Klein is a 73 y.o. female who is coming in today for the above mentioned reasons.  She always deals with terrible spring allergies.  She has been having congestion, ear pressure, postnasal drip and itchy, watery eyes.   Past Medical/Surgical History: Past Medical History:  Diagnosis Date   Arthritis    Asthma    mild - allergy season induced   Cancer (HCC)    basal cell skin cancer   Complication of anesthesia    Sensitive to propofol - and BP drops   Dysrhythmia    Ear infection    Environmental and seasonal allergies    Femoral hernia of right side with obstruction 01/18/2018   GERD (gastroesophageal reflux disease)    no longer having issues takes TUMS occ.   History of kidney stones    Hypotension    Irritant contact dermatitis associated with stoma 06/25/2020   Pneumonia 02/2016   walking pneumonia    RBBB    Sinus infection     Past Surgical History:  Procedure Laterality Date   CATARACT EXTRACTION     CESAREAN SECTION  1979   1985   CO2 LASER APPLICATION N/A 05/10/2015   Procedure: CO2 LASER APPLICATION;  Surgeon: Waynard Reeds, MD;  Location: [facility];  Service: Gynecology;  Laterality: N/A;   COLON SURGERY  04/10/2020   with sepsis and colostomy   COLONOSCOPY     COLONOSCOPY WITH PROPOFOL N/A 11/11/2020   Procedure: COLONOSCOPY WITH PROPOFOL;  Surgeon: Rachael Fee, MD;  Location: [facility];  Service: Endoscopy;  Laterality: N/A;  NEEDS PREVISIT WITH ANESTHESIA   FOOT SURGERY Right 2009   INCISIONAL HERNIA REPAIR N/A 04/06/2021   Procedure: HERNIA REPAIR INCISIONAL REPAIR WITH MESH;  Surgeon: Axel Filler, MD;  Location: [facility] OR;  Service: General;  Laterality: N/A;   INGUINAL HERNIA REPAIR Right 01/18/2018   Procedure: OPEN REPAIR INCARCERATED RIGHT FEMORAL HERNIA WITH MESH;  Surgeon: Claud Kelp, MD;  Location: [facility];  Service: General;   Laterality: Right;  GENERAL / TAP BLOCK   INSERTION OF MESH N/A 04/06/2021   Procedure: INSERTION OF MESH;  Surgeon: Axel Filler, MD;  Location: [facility] OR;  Service: General;  Laterality: N/A;   KNEE SURGERY Left 2016   LAPAROTOMY N/A 04/06/2021   Procedure: EXPLORATORY LAPAROTOMY;  Surgeon: Axel Filler, MD;  Location: [facility] OR;  Service: General;  Laterality: N/A;   LYSIS OF ADHESION N/A 04/06/2021   Procedure: LYSIS OF ADHESION;  Surgeon: Axel Filler, MD;  Location: [facility] OR;  Service: General;  Laterality: N/A;   ORIF ELBOW FRACTURE Left 07/04/2014   Procedure: OPEN REDUCTION INTERNAL FIXATION (ORIF) ELBOW/OLECRANON FRACTURE;  Surgeon: Cammy Copa, MD;  Location: [facility];  Service: Orthopedics;  Laterality: Left;   STOMA DILATATION N/A 12/08/2020   Procedure: STOMA DILATATION WITH STOMAPLASTY;  Surgeon: Luretha Murphy, MD;  Location: [facility];  Service: General;  Laterality: N/A;   VULVECTOMY N/A 08/10/2016   Procedure: WIDE EXCISION VULVECTOMY;  Surgeon: Waynard Reeds, MD;  Location: [facility];  Service: Gynecology;  Laterality: N/A;   XI ROBOTIC ASSISTED COLOSTOMY TAKEDOWN N/A 01/05/2021   Procedure: ROBOTIC COLOSTOMY REVERSAL WITH LAPAROSCOPIC LYSIS OF ADHESIONS;  Surgeon: Romie Levee, MD;  Location: [facility];  Service: General;  Laterality: N/A;    Social History:  reports that she quit smoking about 8 years ago. Her smoking use included cigarettes. She  started smoking about 48 years ago. She has a 4 pack-year smoking history. She has never used smokeless tobacco. She reports current alcohol use. She reports that she does not use drugs.  Allergies: Allergies  Allergen Reactions   Iodinated Contrast Media Anaphylaxis   Shellfish Allergy Anaphylaxis   Vancomycin Anaphylaxis   Wasp Venom Anaphylaxis   Ciprofloxacin Hives and Swelling    Hives   Sulfa Antibiotics Swelling and Other (See Comments)    Migraine   Zithromax [Azithromycin] Hives   Latex Rash    Sensitivity on too  long   Propofol     Couldn't wake patient up - needs lower doses     Family History:  Family History  Problem Relation Age of Onset   Dementia Mother    Kidney cancer Father    Breast cancer Maternal Grandmother    Colon cancer Neg Hx    Esophageal cancer Neg Hx    Pancreatic cancer Neg Hx    Stomach cancer Neg Hx      Current Outpatient Medications:    albuterol (VENTOLIN HFA) 108 (90 Base) MCG/ACT inhaler, INHALE 2 PUFFS INTO THE LUNGS EVERY 6 HOURS AS NEEDED FOR WHEEZING OR SHORTNESS OF BREATH, Disp: 8.5 g, Rfl: 2   azelastine (ASTELIN) 0.1 % nasal spray, Place 2 sprays into both nostrils 2 (two) times daily. Use in each nostril as directed, Disp: 30 mL, Rfl: 12   EPINEPHrine 0.3 MG/0.3ML SOSY, Inject 0.3 mLs (0.3 mg total) into the muscle as needed for anaphylaxis, Disp: 1 each, Rfl: 2   estradiol (CLIMARA - DOSED IN MG/24 HR) 0.05 mg/24hr patch, 0.05 mg once a week., Disp: , Rfl:    fexofenadine-pseudoephedrine (ALLEGRA-D) 60-120 MG 12 hr tablet, Take 1 tablet by mouth daily., Disp: , Rfl:    latanoprost (XALATAN) 0.005 % ophthalmic solution, Place 1 drop into both eyes at bedtime., Disp: , Rfl:    Multiple Vitamin (MULTIVITAMIN WITH MINERALS) TABS tablet, Take 1 tablet by mouth daily., Disp: , Rfl:    progesterone (PROMETRIUM) 100 MG capsule, Take 100 mg by mouth daily., Disp: , Rfl:   Review of Systems:  Negative unless indicated in HPI.   Physical Exam: Vitals:   05/14/23 0923  BP: 120/78  Pulse: 91  Temp: 98.3 F (36.8 C)  TempSrc: Oral  SpO2: 96%  Weight: 161 lb 12.8 oz (73.4 kg)    Body mass index is 28.21 kg/m.   Physical Exam Vitals reviewed.  Constitutional:      Appearance: Normal appearance.  HENT:     Right Ear: Tympanic membrane, ear canal and external ear normal.     Left Ear: Tympanic membrane, ear canal and external ear normal.     Mouth/Throat:     Mouth: Mucous membranes are moist.     Pharynx: Oropharynx is clear.  Eyes:      Conjunctiva/sclera: Conjunctivae normal.     Pupils: Pupils are equal, round, and reactive to light.  Cardiovascular:     Rate and Rhythm: Normal rate and regular rhythm.  Pulmonary:     Effort: Pulmonary effort is normal.     Breath sounds: Normal breath sounds.  Neurological:     Mental Status: She is alert.      Impression and Plan:  Seasonal allergies   -Have advised to combination of antihistamine, intranasal steroid, Mucinex and olopatadine eyedrops.  Time spent:22 minutes reviewing chart, interviewing and examining patient and formulating plan of care.     Chaya Jan,  MD [facility]

## 2023-05-23 ENCOUNTER — Encounter: Payer: Self-pay | Admitting: Family Medicine

## 2023-05-23 ENCOUNTER — Ambulatory Visit: Payer: Self-pay

## 2023-05-23 ENCOUNTER — Ambulatory Visit: Admitting: Family Medicine

## 2023-05-23 VITALS — BP 110/72 | HR 93 | Temp 99.0°F | Ht 63.5 in | Wt 161.8 lb

## 2023-05-23 DIAGNOSIS — G4489 Other headache syndrome: Secondary | ICD-10-CM | POA: Diagnosis not present

## 2023-05-23 DIAGNOSIS — J0140 Acute pansinusitis, unspecified: Secondary | ICD-10-CM | POA: Diagnosis not present

## 2023-05-23 DIAGNOSIS — J3489 Other specified disorders of nose and nasal sinuses: Secondary | ICD-10-CM

## 2023-05-23 DIAGNOSIS — J029 Acute pharyngitis, unspecified: Secondary | ICD-10-CM | POA: Diagnosis not present

## 2023-05-23 DIAGNOSIS — R0982 Postnasal drip: Secondary | ICD-10-CM

## 2023-05-23 LAB — POC COVID19 BINAXNOW: SARS Coronavirus 2 Ag: NEGATIVE

## 2023-05-23 MED ORDER — AMOXICILLIN-POT CLAVULANATE 500-125 MG PO TABS
1.0000 | ORAL_TABLET | Freq: Two times a day (BID) | ORAL | 0 refills | Status: AC
Start: 2023-05-23 — End: 2023-05-30

## 2023-05-23 NOTE — Patient Instructions (Addendum)
 Great news your COVID test was negative.  A prescription for Augmentin was sent to your pharmacy.  You can take 1 pill twice a day for the next 7 days to help with your symptoms.

## 2023-05-23 NOTE — Telephone Encounter (Signed)
 Copied from CRM [id number]. Topic: Clinical - Pink Word Triage >> May 23, 2023 11:35 AM Truddie Crumble wrote: Reason for CRM: patient called stating she saw her provider on 3/31 for sinus and allergies and she was told to call back if she gets a fever. Patient stated her fever is  100.5-101. Patient took tylenol two hours ago and it went down to 99.8  Chief Complaint: Sinus symptoms Symptoms: Fever, sore throat, nasal discharge, fullness in ears Frequency: Since 05/14/23 Pertinent Negatives: Patient denies difficulty breathing Disposition: [] ED /[] Urgent Care (no appt availability in office) / [x] Appointment(In office/virtual)/ []  [facility]/ [] Home Care/ [] Refused Recommended Disposition /[] [facility]/ []  Follow-up with PCP Additional Notes: Patient called in to report sinus symptoms and a low grade fever. Patient was seen in office on 05/14/23 for seasonal allergies/sinus symptoms. Patient stated she was instructed to follow-up if she started to run a fever. Patient reported a fever of 101 today. Patient stated Tylenol helped to break fever. Patient reported green/yellow nasal mucous, fullness in ears and sore throat. Advised patient to see provider within 24 hours, per protocol. Patient requested appointment this afternoon. No availability with PCP. Scheduled with alternate provider in office. Provided care advice and instructed patient to call back if symptoms worsen. Patient complied.   Reason for Disposition  [1] Sinus pain (not just congestion) AND [2] fever  Answer Assessment - Initial Assessment Questions 1. LOCATION: "Where does it hurt?"      Underneath eyes, ears feel full 2. ONSET: "When did the sinus pain start?"  (e.g., hours, days)      Seen OV on 05/14/23, low grade fever started today 3. SEVERITY: "How bad is the pain?"   (Scale 1-10; mild, moderate or severe)   - MILD (1-3): doesn't interfere with normal activities    - MODERATE (4-7): interferes with normal  activities (e.g., work or school) or awakens from sleep   - SEVERE (8-10): excruciating pain and patient unable to do any normal activities        Rates pain 7-8 4. RECURRENT SYMPTOM: "Have you ever had sinus problems before?" If Yes, ask: "When was the last time?" and "What happened that time?"      Recurrent seasonal allergies 5. NASAL CONGESTION: "Is the nose blocked?" If Yes, ask: "Can you open it or must you breathe through your mouth?"     States she is able to breath 6. NASAL DISCHARGE: "Do you have discharge from your nose?" If so ask, "What color?"     Green/yellow mucous 7. FEVER: "Do you have a fever?" If Yes, ask: "What is it, how was it measured, and when did it start?"      Low grade fever of 101 8. OTHER SYMPTOMS: "Do you have any other symptoms?" (e.g., sore throat, cough, earache, difficulty breathing)     Fullness in ears, sore throat due to drainage  Protocols used: Sinus Pain or Congestion-A-AH

## 2023-05-23 NOTE — Progress Notes (Signed)
 Established Patient Office Visit   Subjective  Patient ID: Amber Klein, female    DOB: [birth year]  Age: 73 y.o. MRN: [id number]  Chief Complaint  Patient presents with   Sinus Problem    Patient came in today for a follow-up, sinus pain and pressure, ear pressure, fever (101.0 today) sore throat, started 3 weeks ago, in the last 2 day is worse     Patient is a 73 year old female followed by Dr. Ardyth Harps and seen for worsening of ongoing concern.  Patient states she was seen on 3/31 by PCP for sinus symptoms.  Patient tried OTC meds including sinus rinse, Allegra-D, nasal spray for symptoms which increased 2 days ago.  Patient endorses feeling sluggish/dragging.  Developed a fever Tmax 101.54F overnight.  Took 2 extra strength Tylenol.  Also has ear pressure, pressure in right cheek, rhinorrhea, and postnasal drainage.  Patient denies nausea, vomiting, change in appetite, cough, loose stools, sick contacts.    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   Colonic obstruction (HCC) 12/08/2020   Diverticulosis of colon 10/08/2020   Intractable vomiting 07/06/2020   Hypoxia 07/06/2020   Irritant contact dermatitis associated with stoma 06/25/2020   Diverticulitis 05/21/2020   GERD (gastroesophageal reflux disease)    Acute suppurative peritonitis (HCC) 04/11/2020   Perforated sigmoid colon (HCC) 04/10/2020   Other constipation 04/06/2020   Femoral hernia of right side with obstruction 01/18/2018   Elbow fracture 07/04/2014   Past Medical History:  Diagnosis Date   Arthritis    Asthma    mild - allergy season induced   Cancer (HCC)    basal cell skin cancer   Complication of anesthesia    Sensitive to propofol - and BP drops   Dysrhythmia    Ear infection    Environmental and seasonal allergies    Femoral hernia of  right side with obstruction 01/18/2018   GERD (gastroesophageal reflux disease)    no longer having issues takes TUMS occ.   History of kidney stones    Hypotension    Irritant contact dermatitis associated with stoma 06/25/2020   Pneumonia 02/2016   walking pneumonia    RBBB    Sinus infection    Past Surgical History:  Procedure Laterality Date   CATARACT EXTRACTION     CESAREAN SECTION  1979   1985   CO2 LASER APPLICATION N/A 05/10/2015   Procedure: CO2 LASER APPLICATION;  Surgeon: Waynard Reeds, MD;  Location: [facility];  Service: Gynecology;  Laterality: N/A;   COLON SURGERY  04/10/2020   with sepsis and colostomy   COLONOSCOPY     COLONOSCOPY WITH PROPOFOL N/A 11/11/2020   Procedure: COLONOSCOPY WITH PROPOFOL;  Surgeon: Rachael Fee, MD;  Location: [facility];  Service: Endoscopy;  Laterality: N/A;  NEEDS PREVISIT WITH ANESTHESIA   FOOT SURGERY Right 2009   INCISIONAL HERNIA REPAIR N/A 04/06/2021   Procedure: HERNIA REPAIR INCISIONAL REPAIR WITH MESH;  Surgeon: Axel Filler, MD;  Location: [facility] OR;  Service: General;  Laterality: N/A;   INGUINAL HERNIA REPAIR Right 01/18/2018   Procedure: OPEN REPAIR INCARCERATED RIGHT FEMORAL HERNIA WITH MESH;  Surgeon: Claud Kelp, MD;  Location: [facility] OR;  Service: General;  Laterality: Right;  GENERAL / TAP BLOCK   INSERTION OF MESH N/A 04/06/2021   Procedure: INSERTION OF MESH;  Surgeon: Axel Filler, MD;  Location: [facility] OR;  Service: General;  Laterality: N/A;   KNEE SURGERY Left 2016   LAPAROTOMY N/A 04/06/2021   Procedure: EXPLORATORY LAPAROTOMY;  Surgeon: Axel Filler, MD;  Location[facility] OR;  Service: General;  Laterality: N/A;   LYSIS OF ADHESION N/A 04/06/2021   Procedure: LYSIS OF ADHESION;  Surgeon: Axel Filler, MD;  Location: [facility] OR;  Service: General;  Laterality: N/A;   ORIF ELBOW FRACTURE Left 07/04/2014   Procedure: OPEN REDUCTION INTERNAL FIXATION (ORIF) ELBOW/OLECRANON FRACTURE;  Surgeon: Cammy Copa, MD;   Location: [facility];  Service: Orthopedics;  Laterality: Left;   STOMA DILATATION N/A 12/08/2020   Procedure: STOMA DILATATION WITH STOMAPLASTY;  Surgeon: Luretha Murphy, MD;  Location: [facility];  Service: General;  Laterality: N/A;   VULVECTOMY N/A 08/10/2016   Procedure: WIDE EXCISION VULVECTOMY;  Surgeon: Waynard Reeds, MD;  Location: [facility];  Service: Gynecology;  Laterality: N/A;   XI ROBOTIC ASSISTED COLOSTOMY TAKEDOWN N/A 01/05/2021   Procedure: ROBOTIC COLOSTOMY REVERSAL WITH LAPAROSCOPIC LYSIS OF ADHESIONS;  Surgeon: Romie Levee, MD;  Location: [facility];  Service: General;  Laterality: N/A;   Social History   Tobacco Use   Smoking status: Former    Current packs/day: 0.00    Average packs/day: 0.1 packs/day for 40.0 years (4.0 ttl pk-yrs)    Types: Cigarettes    Start date: 08/09/1974    Quit date: 08/09/2014    Years since quitting: 8.7   Smokeless tobacco: Never  Vaping Use   Vaping status: Never Used  Substance Use Topics   Alcohol use: Yes    Comment: 1-2 beers per week   Drug use: No   Family History  Problem Relation Age of Onset   Dementia Mother    Kidney cancer Father    Breast cancer Maternal Grandmother    Colon cancer Neg Hx    Esophageal cancer Neg Hx    Pancreatic cancer Neg Hx    Stomach cancer Neg Hx    Allergies  Allergen Reactions   Iodinated Contrast Media Anaphylaxis   Shellfish Allergy Anaphylaxis   Vancomycin Anaphylaxis   Wasp Venom Anaphylaxis   Ciprofloxacin Hives and Swelling    Hives   Sulfa Antibiotics Swelling and Other (See Comments)    Migraine   Zithromax [Azithromycin] Hives   Latex Rash    Sensitivity on too long   Propofol     Couldn't wake patient up - needs lower doses       ROS Negative unless stated above    Objective:     BP 110/72 (BP Location: Right Arm, Patient Position: Sitting, Cuff Size: Normal)   Pulse 93   Temp 99 F (37.2 C) (Oral)   Ht 5' 3.5" (1.613 m)   Wt 161 lb 12.8 oz (73.4 kg)   SpO2 96%    BMI 28.21 kg/m  BP Readings from Last 3 Encounters:  05/23/23 110/72  05/14/23 120/78  03/26/23 110/80   Wt Readings from Last 3 Encounters:  05/23/23 161 lb 12.8 oz (73.4 kg)  05/14/23 161 lb 12.8 oz (73.4 kg)  03/26/23 159 lb 4.8 oz (72.3 kg)    Physical Exam Constitutional:      General: She is not in acute distress.    Appearance: Normal appearance.  HENT:     Head: Normocephalic and atraumatic.     Nose: Nose normal.     Mouth/Throat:     Mouth: Mucous membranes are moist.  Cardiovascular:     Rate and Rhythm:  Normal rate and regular rhythm.     Heart sounds: Normal heart sounds. No murmur heard.    No gallop.  Pulmonary:     Effort: Pulmonary effort is normal. No respiratory distress.     Breath sounds: Normal breath sounds. No wheezing, rhonchi or rales.  Skin:    General: Skin is warm and dry.  Neurological:     Mental Status: She is alert and oriented to person, place, and time.      Results for orders placed or performed in visit on 05/23/23  POC COVID-19 BinaxNow  Result Value Ref Range   SARS Coronavirus 2 Ag Negative Negative      Assessment & Plan:  Acute pansinusitis, recurrence not specified -     Amoxicillin-Pot Clavulanate; Take 1 tablet by mouth in the morning and at bedtime for 7 days.  Dispense: 14 tablet; Refill: 0  Sorethroat -     POC COVID-19 BinaxNow  Post-nasal drainage  Other headache syndrome  Sinus pressure  POC COVID testing negative.  Start ABX for pansinusitis.  Allergy to Cipro, sulfa, azithromycin, vancomycin.  Continue supportive care with antihistamines, saline nasal rinse, Tylenol or NSAIDs as needed.  Follow-up with PCP as needed.  Return if symptoms worsen or fail to improve.   Deeann Saint, MD

## 2023-09-21 ENCOUNTER — Ambulatory Visit
Admission: RE | Admit: 2023-09-21 | Discharge: 2023-09-21 | Disposition: A | Discharge status: Home / Self Care | Source: Ambulatory Visit | Attending: Family Medicine | Admitting: Family Medicine

## 2023-09-21 VITALS — BP 127/92 | HR 84 | Temp 98.2°F | Resp 18

## 2023-09-21 DIAGNOSIS — H6991 Unspecified Eustachian tube disorder, right ear: Secondary | ICD-10-CM

## 2023-09-21 MED ORDER — PREDNISONE 20 MG PO TABS
40.0000 mg | ORAL_TABLET | Freq: Every day | ORAL | 0 refills | Status: AC
Start: 2023-09-21 — End: 2023-09-26

## 2023-09-21 NOTE — ED Provider Notes (Signed)
 [facility]    CSN: [id number] Arrival date & time: 09/21/23  1151      History   Chief Complaint Chief Complaint  Patient presents with   Ear Fullness    Pain in right ear.  Have allergies all the time.  Have tried nasal sprays and OTC Allegra D but ear is hurting now. - Entered by patient    HPI Amber Klein is a 73 y.o. female with a past medical history of asthma, allergies, GERD who presents for ear pain.  Patient reports 3 days of right ear pain with muffled hearing.  She denies any drainage, fevers.  Does endorse some chronic allergy symptoms for which she takes Allegra-D.  She denies excessive water in the ear.  Does have a history of recurrent ear infections/ear issues.  She is also been using a prescription nasal spray twice daily.  No other concerns at this time.   Ear Fullness    Past Medical History:  Diagnosis Date   Arthritis    Asthma    mild - allergy season induced   Cancer (HCC)    basal cell skin cancer   Complication of anesthesia    Sensitive to propofol  - and BP drops   Dysrhythmia    Ear infection    Environmental and seasonal allergies    Femoral hernia of right side with obstruction 01/18/2018   GERD (gastroesophageal reflux disease)    no longer having issues takes TUMS occ.   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   Colonic obstruction (HCC) 12/08/2020   Diverticulosis of colon 10/08/2020   Intractable vomiting 07/06/2020   Hypoxia 07/06/2020   Irritant contact dermatitis associated with stoma 06/25/2020   Diverticulitis 05/21/2020   GERD (gastroesophageal reflux disease)    Acute  suppurative peritonitis (HCC) 04/11/2020   Perforated sigmoid colon (HCC) 04/10/2020   Other constipation 04/06/2020   Femoral hernia of right side with obstruction 01/18/2018   Elbow fracture 07/04/2014    Past Surgical History:  Procedure Laterality Date   CATARACT EXTRACTION     CESAREAN SECTION  1979   1985   CO2 LASER APPLICATION N/A 05/10/2015   Procedure: CO2 LASER APPLICATION;  Surgeon: Marjorie Gull, MD;  Location: [facility];  Service: Gynecology;  Laterality: N/A;   COLON SURGERY  04/10/2020   with sepsis and colostomy   COLONOSCOPY     COLONOSCOPY WITH PROPOFOL  N/A 11/11/2020   Procedure: COLONOSCOPY WITH PROPOFOL ;  Surgeon: Teressa Toribio SQUIBB, MD;  Location: [facility];  Service: Endoscopy;  Laterality: N/A;  NEEDS PREVISIT WITH ANESTHESIA   FOOT SURGERY Right 2009   INCISIONAL HERNIA REPAIR N/A 04/06/2021   Procedure: HERNIA REPAIR INCISIONAL REPAIR WITH MESH;  Surgeon: Rubin Calamity, MD;  Location: [facility] OR;  Service: General;  Laterality: N/A;   INGUINAL HERNIA REPAIR Right 01/18/2018   Procedure: OPEN REPAIR INCARCERATED RIGHT FEMORAL HERNIA WITH MESH;  Surgeon: Gail Favorite, MD;  Location: [facility] OR;  Service: General;  Laterality: Right;  GENERAL / TAP BLOCK   INSERTION OF MESH N/A 04/06/2021   Procedure: INSERTION OF MESH;  Surgeon: Rubin Calamity,  MD;  Location: [facility] OR;  Service: General;  Laterality: N/A;   KNEE SURGERY Left 2016   LAPAROTOMY N/A 04/06/2021   Procedure: EXPLORATORY LAPAROTOMY;  Surgeon: Rubin Calamity, MD;  Location: [facility] OR;  Service: General;  Laterality: N/A;   LYSIS OF ADHESION N/A 04/06/2021   Procedure: LYSIS OF ADHESION;  Surgeon: Rubin Calamity, MD;  Location: [facility] OR;  Service: General;  Laterality: N/A;   ORIF ELBOW FRACTURE Left 07/04/2014   Procedure: OPEN REDUCTION INTERNAL FIXATION (ORIF) ELBOW/OLECRANON FRACTURE;  Surgeon: Glendia Cordella Hutchinson, MD;  Location: [facility];  Service: Orthopedics;  Laterality: Left;   STOMA DILATATION N/A 12/08/2020    Procedure: STOMA DILATATION WITH STOMAPLASTY;  Surgeon: Gladis Cough, MD;  Location: [facility];  Service: General;  Laterality: N/A;   VULVECTOMY N/A 08/10/2016   Procedure: WIDE EXCISION VULVECTOMY;  Surgeon: Okey Leader, MD;  Location: [facility];  Service: Gynecology;  Laterality: N/A;   XI ROBOTIC ASSISTED COLOSTOMY TAKEDOWN N/A 01/05/2021   Procedure: ROBOTIC COLOSTOMY REVERSAL WITH LAPAROSCOPIC LYSIS OF ADHESIONS;  Surgeon: Debby Hila, MD;  Location: [facility];  Service: General;  Laterality: N/A;    OB History     Gravida  4   Para  2   Term  2   Preterm      AB  2   Living         SAB  1   IAB      Ectopic  1   Multiple      Live Births  2            Home Medications    Prior to Admission medications   Medication Sig Start Date End Date Taking? Authorizing Provider  predniSONE  (DELTASONE ) 20 MG tablet Take 2 tablets (40 mg total) by mouth daily with breakfast for 5 days. 09/21/23 09/26/23 Yes Kenika Sahm, Jodi R, NP  albuterol  (VENTOLIN  HFA) 108 (90 Base) MCG/ACT inhaler INHALE 2 PUFFS INTO THE LUNGS EVERY 6 HOURS AS NEEDED FOR WHEEZING OR SHORTNESS OF BREATH 04/11/23   Theophilus Andrews, Tully GRADE, MD  azelastine  (ASTELIN ) 0.1 % nasal spray Place 2 sprays into both nostrils 2 (two) times daily. Use in each nostril as directed 04/29/23   Rolan Berthold, PA-C  EPINEPHrine  0.3 MG/0.3ML SOSY Inject 0.3 mLs (0.3 mg total) into the muscle as needed for anaphylaxis 09/20/21   Theophilus Andrews, Tully GRADE, MD  estradiol  (CLIMARA  - DOSED IN MG/24 HR) 0.05 mg/24hr patch 0.05 mg once a week.    [provider]  fexofenadine-pseudoephedrine (ALLEGRA-D) 60-120 MG 12 hr tablet Take 1 tablet by mouth daily.    [provider]  latanoprost  (XALATAN ) 0.005 % ophthalmic solution Place 1 drop into both eyes at bedtime.    [provider]  Multiple Vitamin (MULTIVITAMIN WITH MINERALS) TABS tablet Take 1 tablet by mouth daily.    [provider]   progesterone  (PROMETRIUM ) 100 MG capsule Take 100 mg by mouth daily.    [provider]    Family History Family History  Problem Relation Age of Onset   Dementia Mother    Kidney cancer Father    Breast cancer Maternal Grandmother    Colon cancer Neg Hx    Esophageal cancer Neg Hx    Pancreatic cancer Neg Hx    Stomach cancer Neg Hx     Social History Social History   Tobacco Use   Smoking status: Former    Current packs/day: 0.00    Average packs/day: 0.1 packs/day  for 40.0 years (4.0 ttl pk-yrs)    Types: Cigarettes    Start date: 08/09/1974    Quit date: 08/09/2014    Years since quitting: 9.1   Smokeless tobacco: Never  Vaping Use   Vaping status: Never Used  Substance Use Topics   Alcohol use: Yes    Comment: 1-2 beers per week   Drug use: No     Allergies   Iodinated contrast media, Shellfish allergy, Vancomycin, Wasp venom, Ciprofloxacin, Sulfa antibiotics, Zithromax [azithromycin], Latex, and Propofol    Review of Systems Review of Systems  HENT:  Positive for ear pain.      Physical Exam Triage Vital Signs ED Triage Vitals  Encounter Vitals Group     BP 09/21/23 1206 (!) 127/92     Girls Systolic BP Percentile --      Girls Diastolic BP Percentile --      Boys Systolic BP Percentile --      Boys Diastolic BP Percentile --      Pulse Rate 09/21/23 1206 84     Resp 09/21/23 1206 18     Temp 09/21/23 1206 98.2 F (36.8 C)     Temp Source 09/21/23 1206 Oral     SpO2 09/21/23 1206 94 %     Weight --      Height --      Head Circumference --      Peak Flow --      Pain Score 09/21/23 1205 5     Pain Loc --      Pain Education --      Exclude from Growth Chart --    No data found.  Updated Vital Signs BP (!) 127/92 (BP Location: Right Arm)   Pulse 84   Temp 98.2 F (36.8 C) (Oral)   Resp 18   SpO2 94%   Visual Acuity Right Eye Distance:   Left Eye Distance:   Bilateral Distance:    Right Eye Near:   Left Eye Near:     Bilateral Near:     Physical Exam Vitals and nursing note reviewed.  Constitutional:      General: She is not in acute distress.    Appearance: Normal appearance. She is not ill-appearing.  HENT:     Head: Normocephalic and atraumatic.     Right Ear: Ear canal normal. A middle ear effusion is present. There is no impacted cerumen. Tympanic membrane is not erythematous.     Left Ear: Tympanic membrane and ear canal normal.  Eyes:     Pupils: Pupils are equal, round, and reactive to light.  Cardiovascular:     Rate and Rhythm: Normal rate.  Pulmonary:     Effort: Pulmonary effort is normal.  Skin:    General: Skin is warm and dry.  Neurological:     General: No focal deficit present.     Mental Status: She is alert and oriented to person, place, and time.  Psychiatric:        Mood and Affect: Mood normal.        Behavior: Behavior normal.      UC Treatments / Results  Labs (all labs ordered are listed, but only abnormal results are displayed) Labs Reviewed - No data to display  EKG   Radiology No results found.  Procedures Procedures (including critical care time)  Medications Ordered in UC Medications - No data to display  Initial Impression / Assessment and Plan / UC Course  I have reviewed  the triage vital signs and the nursing notes.  Pertinent labs & imaging results that were available during my care of the patient were reviewed by me and considered in my medical decision making (see chart for details).     Reviewed exam and symptoms with patient.  No red flags.  Discussed eustachian tube dysfunction, she is to continue her nasal spray and Allegra-D and will add on prednisone .  Advised PCP or ENT follow-up if symptoms do not improve.  ER precautions reviewed. Final Clinical Impressions(s) / UC Diagnoses   Final diagnoses:  Dysfunction of right eustachian tube     Discharge Instructions      Start daily for 5 days.  You may continue your allergy  medicine as well as your nasal sprays.  Please follow-up with your PCP or ear nose and throat if your symptoms do not improve.  Please go to the emergency room if you develop any worsening symptoms.  I hope you feel better soon!    ED Prescriptions     Medication Sig Dispense Auth. Provider   predniSONE  (DELTASONE ) 20 MG tablet Take 2 tablets (40 mg total) by mouth daily with breakfast for 5 days. 10 tablet Yigit Norkus, Jodi R, NP      PDMP not reviewed this encounter.   Loreda Myla SAUNDERS, NP 09/21/23 1224

## 2023-09-21 NOTE — ED Triage Notes (Signed)
 Pt present with rt ear pain x 3 days. States she has tried OTC Allegra and nasal spray for relief. Pt states her rt ear is full and she feels her ear is muffled. Reports the pain inside of her ear and behind the ear lobe. Denies cold symptoms.

## 2023-09-21 NOTE — Discharge Instructions (Addendum)
 Start daily for 5 days.  You may continue your allergy medicine as well as your nasal sprays.  Please follow-up with your PCP or ear nose and throat if your symptoms do not improve.  Please go to the emergency room if you develop any worsening symptoms.  I hope you feel better soon!

## 2023-09-26 ENCOUNTER — Ambulatory Visit: Admitting: Internal Medicine

## 2023-10-07 ENCOUNTER — Encounter: Payer: Self-pay | Admitting: Internal Medicine

## 2023-10-07 DIAGNOSIS — B9689 Other specified bacterial agents as the cause of diseases classified elsewhere: Secondary | ICD-10-CM

## 2023-10-08 MED ORDER — ALBUTEROL SULFATE HFA 108 (90 BASE) MCG/ACT IN AERS: 2.0000 | INHALATION_SPRAY | Freq: Four times a day (QID) | RESPIRATORY_TRACT | 2 refills | Status: DC | PRN

## 2023-10-12 ENCOUNTER — Ambulatory Visit
Admission: RE | Admit: 2023-10-12 | Discharge: 2023-10-12 | Disposition: A | Discharge status: Home / Self Care | Source: Ambulatory Visit | Attending: Family Medicine | Admitting: Family Medicine

## 2023-10-12 ENCOUNTER — Ambulatory Visit (INDEPENDENT_AMBULATORY_CARE_PROVIDER_SITE_OTHER)

## 2023-10-12 VITALS — BP 117/72 | HR 98 | Temp 98.4°F | Resp 20

## 2023-10-12 DIAGNOSIS — M79672 Pain in left foot: Secondary | ICD-10-CM | POA: Diagnosis not present

## 2023-10-12 MED ORDER — PREDNISONE 10 MG PO TABS: 30.0000 mg | ORAL_TABLET | Freq: Every day | ORAL | 0 refills | Status: AC

## 2023-10-12 NOTE — Discharge Instructions (Addendum)
 Rest the foot this weekend, use prednisone  to help with anti-inflammatory properties. If you develop fever, a beet red discoloration, worsening pain then come back pain.

## 2023-10-12 NOTE — ED Triage Notes (Signed)
 Pt c/o pain/swelling to top of right foot x 5 days-denies injury-NAD-steady gait

## 2023-10-12 NOTE — ED Provider Notes (Signed)
 [facility]  Note:  This document was prepared using Conservation officer, historic buildings and may include unintentional dictation errors.  MRN: [id number] DOB: May 29, 1950  Subjective:   Amber Klein is a 73 y.o. female presenting for 5-day history of acute onset persistent left foot pain and swelling over the mid to the lateral aspect distally.  No fall, trauma, known insect bug or sting.  No drainage of pus or bleeding.  No bruising.  She has a history of arthritis but is not her primary concern for her left foot pain and swelling.  Patient is quite active but does not reach 10,000 steps daily.  No history of gout.  No current facility-administered medications for this encounter.  Current Outpatient Medications:    albuterol  (VENTOLIN  HFA) 108 (90 Base) MCG/ACT inhaler, Inhale 2 puffs into the lungs every 6 (six) hours as needed for wheezing or shortness of breath., Disp: 8.5 g, Rfl: 2   azelastine  (ASTELIN ) 0.1 % nasal spray, Place 2 sprays into both nostrils 2 (two) times daily. Use in each nostril as directed, Disp: 30 mL, Rfl: 12   EPINEPHrine  0.3 MG/0.3ML SOSY, Inject 0.3 mLs (0.3 mg total) into the muscle as needed for anaphylaxis, Disp: 1 each, Rfl: 2   estradiol  (CLIMARA  - DOSED IN MG/24 HR) 0.05 mg/24hr patch, 0.05 mg once a week., Disp: , Rfl:    fexofenadine-pseudoephedrine (ALLEGRA-D) 60-120 MG 12 hr tablet, Take 1 tablet by mouth daily., Disp: , Rfl:    latanoprost  (XALATAN ) 0.005 % ophthalmic solution, Place 1 drop into both eyes at bedtime., Disp: , Rfl:    Multiple Vitamin (MULTIVITAMIN WITH MINERALS) TABS tablet, Take 1 tablet by mouth daily., Disp: , Rfl:    progesterone  (PROMETRIUM ) 100 MG capsule, Take 100 mg by mouth daily., Disp: , Rfl:    Allergies  Allergen Reactions   Iodinated Contrast Media Anaphylaxis   Shellfish Allergy Anaphylaxis   Vancomycin Anaphylaxis   Wasp Venom Anaphylaxis   Ciprofloxacin Hives and Swelling    Hives    Sulfa Antibiotics Swelling and Other (See Comments)    Migraine   Zithromax [Azithromycin] Hives   Latex Rash    Sensitivity on too long   Propofol      Couldn't wake patient up - needs lower doses     Past Medical History:  Diagnosis Date   Arthritis    Asthma    mild - allergy season induced   Cancer (HCC)    basal cell skin cancer   Complication of anesthesia    Sensitive to propofol  - and BP drops   Dysrhythmia    Ear infection    Environmental and seasonal allergies    Femoral hernia of right side with obstruction 01/18/2018   GERD (gastroesophageal reflux disease)    no longer having issues takes TUMS occ.   History of kidney stones    Hypotension    Irritant contact dermatitis associated with stoma 06/25/2020   Pneumonia 02/2016   walking pneumonia    RBBB    Sinus infection      Past Surgical History:  Procedure Laterality Date   CATARACT EXTRACTION     CESAREAN SECTION  1979   1985   CO2 LASER APPLICATION N/A 05/10/2015   Procedure: CO2 LASER APPLICATION;  Surgeon: Marjorie Gull, MD;  Location: [facility];  Service: Gynecology;  Laterality: N/A;   COLON SURGERY  04/10/2020   with sepsis and colostomy   COLONOSCOPY     COLONOSCOPY WITH  PROPOFOL  N/A 11/11/2020   Procedure: COLONOSCOPY WITH PROPOFOL ;  Surgeon: Teressa Toribio SQUIBB, MD;  Location: [facility];  Service: Endoscopy;  Laterality: N/A;  NEEDS PREVISIT WITH ANESTHESIA   FOOT SURGERY Right 2009   INCISIONAL HERNIA REPAIR N/A 04/06/2021   Procedure: HERNIA REPAIR INCISIONAL REPAIR WITH MESH;  Surgeon: Rubin Calamity, MD;  [facility];  Service: General;  Laterality: N/A;   INGUINAL HERNIA REPAIR Right 01/18/2018   Procedure: OPEN REPAIR INCARCERATED RIGHT FEMORAL HERNIA WITH MESH;  Surgeon: Gail Favorite, MD;  Location: [facility] OR;  Service: General;  Laterality: Right;  GENERAL / TAP BLOCK   INSERTION OF MESH N/A 04/06/2021   Procedure: INSERTION OF MESH;  Surgeon: Rubin Calamity, MD;  Location: [facility] OR;   Service: General;  Laterality: N/A;   KNEE SURGERY Left 2016   LAPAROTOMY N/A 04/06/2021   Procedure: EXPLORATORY LAPAROTOMY;  Surgeon: Rubin Calamity, MD;  Location: [facility] OR;  Service: General;  Laterality: N/A;   LYSIS OF ADHESION N/A 04/06/2021   Procedure: LYSIS OF ADHESION;  Surgeon: Rubin Calamity, MD;  Location: [facility] OR;  Service: General;  Laterality: N/A;   ORIF ELBOW FRACTURE Left 07/04/2014   Procedure: OPEN REDUCTION INTERNAL FIXATION (ORIF) ELBOW/OLECRANON FRACTURE;  Surgeon: Glendia Cordella Hutchinson, MD;  Location: [facility];  Service: Orthopedics;  Laterality: Left;   STOMA DILATATION N/A 12/08/2020   Procedure: STOMA DILATATION WITH STOMAPLASTY;  Surgeon: Gladis Cough, MD;  Location: [facility];  Service: General;  Laterality: N/A;   VULVECTOMY N/A 08/10/2016   Procedure: WIDE EXCISION VULVECTOMY;  Surgeon: Okey Leader, MD;  Location: [facility];  Service: Gynecology;  Laterality: N/A;   XI ROBOTIC ASSISTED COLOSTOMY TAKEDOWN N/A 01/05/2021   Procedure: ROBOTIC COLOSTOMY REVERSAL WITH LAPAROSCOPIC LYSIS OF ADHESIONS;  Surgeon: Debby Hila, MD;  Location: [facility];  Service: General;  Laterality: N/A;    Family History  Problem Relation Age of Onset   Dementia Mother    Kidney cancer Father    Breast cancer Maternal Grandmother    Colon cancer Neg Hx    Esophageal cancer Neg Hx    Pancreatic cancer Neg Hx    Stomach cancer Neg Hx     Social History   Tobacco Use   Smoking status: Former    Current packs/day: 0.00    Average packs/day: 0.1 packs/day for 40.0 years (4.0 ttl pk-yrs)    Types: Cigarettes    Start date: 08/09/1974    Quit date: 08/09/2014    Years since quitting: 9.1   Smokeless tobacco: Never  Vaping Use   Vaping status: Never Used  Substance Use Topics   Alcohol use: Yes    Comment: occ   Drug use: No    ROS   Objective:   Vitals: BP 117/72 (BP Location: Right Arm)   Pulse 98   Temp 98.4 F (36.9 C) (Oral)   Resp 20   SpO2 95%   Physical  Exam Constitutional:      General: She is not in acute distress.    Appearance: Normal appearance. She is well-developed. She is not ill-appearing, toxic-appearing or diaphoretic.  HENT:     Head: Normocephalic and atraumatic.     Nose: Nose normal.     Mouth/Throat:     Mouth: Mucous membranes are moist.  Eyes:     General: No scleral icterus.       Right eye: No discharge.        Left eye: No discharge.     Extraocular Movements:  Extraocular movements intact.  Cardiovascular:     Rate and Rhythm: Normal rate.  Pulmonary:     Effort: Pulmonary effort is normal.  Musculoskeletal:       Feet:  Skin:    General: Skin is warm and dry.  Neurological:     General: No focal deficit present.     Mental Status: She is alert and oriented to person, place, and time.  Psychiatric:        Mood and Affect: Mood normal.        Behavior: Behavior normal.    DG Foot Complete Left Result Date: 10/12/2023 CLINICAL DATA:  LEFT foot pain EXAM: LEFT FOOT - COMPLETE 3+ VIEW COMPARISON:  None Available. FINDINGS: No fracture or dislocation of mid foot or forefoot. The phalanges are normal. The calcaneus is normal. No soft tissue abnormality. IMPRESSION: No acute osseous abnormality. Electronically Signed   By: Jackquline Boxer M.D.   On: 10/12/2023 13:56    Assessment and Plan :   PDMP not reviewed this encounter.  1. Left foot pain    Recommended managing for inflammatory pain possibly related to overuse.  X-ray negative.  No signs of cellulitis or infectious process.  Will defer antibiotic use at this stage.  Counseled patient on potential for adverse effects with medications prescribed/recommended today, ER and return-to-clinic precautions discussed, patient verbalized understanding.    Christopher Savannah, [place] 10/12/23 1413

## 2024-01-18 ENCOUNTER — Encounter (HOSPITAL_COMMUNITY): Payer: Self-pay | Admitting: General Surgery

## 2024-03-07 ENCOUNTER — Other Ambulatory Visit: Payer: Self-pay | Admitting: Internal Medicine

## 2024-03-07 ENCOUNTER — Telehealth: Payer: Self-pay

## 2024-03-07 DIAGNOSIS — B9689 Other specified bacterial agents as the cause of diseases classified elsewhere: Secondary | ICD-10-CM

## 2024-03-07 MED ORDER — ALBUTEROL SULFATE HFA 108 (90 BASE) MCG/ACT IN AERS: 2.0000 | INHALATION_SPRAY | Freq: Four times a day (QID) | RESPIRATORY_TRACT | 0 refills | Status: AC | PRN

## 2024-03-07 NOTE — Addendum Note (Signed)
 Addended by: CHRISTYNE IDELL DRAGON A on: 03/07/2024 03:39 PM   Modules accepted: Orders

## 2024-03-07 NOTE — Telephone Encounter (Signed)
 Copied from CRM #[id number]. Topic: Clinical - Medication Refill >> Mar 07, 2024  2:44 PM Viola F wrote: Medication: albuterol  (VENTOLIN  HFA) 108 (90 Base) MCG/ACT inhaler [[id number]]  Has the patient contacted their pharmacy? Yes (Agent: If no, request that the patient contact the pharmacy for the refill. If patient does not wish to contact the pharmacy document the reason why and proceed with request.) (Agent: If yes, when and what did the pharmacy advise?)  This is the patient's preferred pharmacy:   [id number] [place], [place] - [address] AT [facility] [address] [facility] [facility] [address] [facility] [address] [place] [address] Phone: [phone] Fax: [phone]  Is this the correct pharmacy for this prescription? Yes If no, delete pharmacy and type the correct one.   Has the prescription been filled recently? Yes  Is the patient out of the medication? No  Has the patient been seen for an appointment in the last year OR does the patient have an upcoming appointment? Yes  Can we respond through MyChart? Yes  Agent: Please be advised that Rx refills may take up to 3 business days. We ask that you follow-up with your pharmacy.

## 2024-03-07 NOTE — Telephone Encounter (Signed)
 Rx done (due to previous past history of asthma in diagnoses from March 2025 visit).

## 2024-03-08 ENCOUNTER — Telehealth: Admitting: Family Medicine

## 2024-03-08 DIAGNOSIS — J019 Acute sinusitis, unspecified: Secondary | ICD-10-CM | POA: Diagnosis not present

## 2024-03-08 DIAGNOSIS — B9689 Other specified bacterial agents as the cause of diseases classified elsewhere: Secondary | ICD-10-CM | POA: Diagnosis not present

## 2024-03-08 MED ORDER — DOXYCYCLINE HYCLATE 100 MG PO TABS: 100.0000 mg | ORAL_TABLET | Freq: Two times a day (BID) | ORAL | 0 refills | Status: AC

## 2024-03-08 MED ORDER — PREDNISONE 20 MG PO TABS: 20.0000 mg | ORAL_TABLET | Freq: Two times a day (BID) | ORAL | 0 refills | Status: AC

## 2024-03-08 NOTE — Patient Instructions (Signed)

## 2024-03-08 NOTE — Progress Notes (Signed)
 " Virtual Visit Consent   Amber Klein, you are scheduled for a virtual visit with a [facility] provider today. Just as with appointments in the office, your consent must be obtained to participate. Your consent will be active for this visit and any virtual visit you may have with one of our providers in the next 365 days. If you have a MyChart account, a copy of this consent can be sent to you electronically.  As this is a virtual visit, video technology does not allow for your provider to perform a traditional examination. This may limit your provider's ability to fully assess your condition. If your provider identifies any concerns that need to be evaluated in person or the need to arrange testing (such as labs, EKG, etc.), we will make arrangements to do so. Although advances in technology are sophisticated, we cannot ensure that it will always work on either your end or our end. If the connection with a video visit is poor, the visit may have to be switched to a telephone visit. With either a video or telephone visit, we are not always able to ensure that we have a secure connection.  By engaging in this virtual visit, you consent to the provision of healthcare and authorize for your insurance to be billed (if applicable) for the services provided during this visit. Depending on your insurance coverage, you may receive a charge related to this service.  I need to obtain your verbal consent now. Are you willing to proceed with your visit today? Amber Klein has provided verbal consent on 03/08/2024 for a virtual visit (video or telephone). Loa Lamp, FNP  Date: 03/08/2024 11:16 AM   Virtual Visit via Video Note   I, Loa Lamp, connected with  Amber Klein  ([id number], 02-20-1950) on 03/08/24 at 11:15 AM EST by a video-enabled telemedicine application and verified that I am speaking with the correct person using two identifiers.  Location: Patient: Virtual Visit  Location Patient: Home Provider: Virtual Visit Location Provider: Home Office   I discussed the limitations of evaluation and management by telemedicine and the availability of in person appointments. The patient expressed understanding and agreed to proceed.    History of Present Illness: Amber Klein is a 74 y.o. 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 Sx worsening.   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   Colonic obstruction (HCC) 12/08/2020   Diverticulosis of colon 10/08/2020   Intractable vomiting 07/06/2020   Hypoxia 07/06/2020   Irritant contact dermatitis associated with stoma 06/25/2020   Diverticulitis 05/21/2020   GERD (gastroesophageal reflux disease)    Acute suppurative peritonitis (HCC) 04/11/2020   Perforated sigmoid colon (HCC) 04/10/2020   Other constipation 04/06/2020   Femoral hernia of right side with obstruction 01/18/2018   Elbow fracture 07/04/2014    Allergies: Allergies[1] Medications: Current Medications[2]  Observations/Objective: Patient is well-developed, well-nourished in no acute distress.  Resting comfortably  at home.  Head is normocephalic, atraumatic.  No labored breathing.  Speech is clear and coherent with logical content.  Patient is alert and oriented at baseline.  Assessment and Plan: 1. Acute bacterial sinusitis (Primary)  Increase fluids, humidifier at night, continue otc meds, UC as needed.   Follow Up Instructions: I discussed the assessment and treatment plan with the patient. The patient was provided an opportunity to ask questions and all were  answered. The patient agreed with the plan and demonstrated an understanding of the instructions.  A copy of instructions were sent to the patient via MyChart unless otherwise noted below.     The patient was advised to call back or seek an in-person evaluation if the symptoms worsen or if the condition fails to improve as anticipated.    Tanzania Basham, FNP     [1]  Allergies Allergen Reactions   Iodinated Contrast Media Anaphylaxis   Shellfish Allergy Anaphylaxis   Vancomycin Anaphylaxis   Wasp Venom Anaphylaxis   Ciprofloxacin Hives and Swelling    Hives   Sulfa Antibiotics Swelling and Other (See Comments)    Migraine   Zithromax [Azithromycin] Hives   Latex Rash    Sensitivity on too long   Propofol      Couldn't wake patient up - needs lower doses   [2]  Current Outpatient Medications:    doxycycline  (VIBRA -TABS) 100 MG tablet, Take 1 tablet (100 mg total) by mouth 2 (two) times daily for 10 days., Disp: 20 tablet, Rfl: 0   predniSONE  (DELTASONE ) 20 MG tablet, Take 1 tablet (20 mg total) by mouth 2 (two) times daily with a meal for 5 days., Disp: 10 tablet, Rfl: 0   albuterol  (VENTOLIN  HFA) 108 (90 Base) MCG/ACT inhaler, Inhale 2 puffs into the lungs every 6 (six) hours as needed for wheezing or shortness of breath., Disp: 8.5 g, Rfl: 0   azelastine  (ASTELIN ) 0.1 % nasal spray, Place 2 sprays into both nostrils 2 (two) times daily. Use in each nostril as directed, Disp: 30 mL, Rfl: 12   EPINEPHrine  0.3 MG/0.3ML SOSY, Inject 0.3 mLs (0.3 mg total) into the muscle as needed for anaphylaxis, Disp: 1 each, Rfl: 2   estradiol  (CLIMARA  - DOSED IN MG/24 HR) 0.05 mg/24hr patch, 0.05 mg once a week., Disp: , Rfl:    fexofenadine-pseudoephedrine (ALLEGRA-D) 60-120 MG 12 hr tablet, Take 1 tablet by mouth daily., Disp: , Rfl:    latanoprost  (XALATAN ) 0.005 % ophthalmic solution, Place 1 drop into both eyes at bedtime., Disp: , Rfl:    Multiple Vitamin (MULTIVITAMIN WITH MINERALS) TABS  tablet, Take 1 tablet by mouth daily., Disp: , Rfl:    predniSONE  (DELTASONE ) 10 MG tablet, Take 3 tablets (30 mg total) by mouth daily with breakfast., Disp: 15 tablet, Rfl: 0   progesterone  (PROMETRIUM ) 100 MG capsule, Take 100 mg by mouth daily., Disp: , Rfl:   "
